# Patient Record
Sex: Female | Born: 1960
Health system: Southern US, Community
[De-identification: ages and names within clinical notes are randomized; demographics above are authoritative.]

## PROBLEM LIST (undated history)

## (undated) DIAGNOSIS — E782 Mixed hyperlipidemia: Secondary | ICD-10-CM

## (undated) DIAGNOSIS — F419 Anxiety disorder, unspecified: Secondary | ICD-10-CM

## (undated) DIAGNOSIS — J302 Other seasonal allergic rhinitis: Secondary | ICD-10-CM

## (undated) DIAGNOSIS — I351 Nonrheumatic aortic (valve) insufficiency: Secondary | ICD-10-CM

## (undated) DIAGNOSIS — Q211 Atrial septal defect: Secondary | ICD-10-CM

## (undated) DIAGNOSIS — G43909 Migraine, unspecified, not intractable, without status migrainosus: Secondary | ICD-10-CM

## (undated) DIAGNOSIS — R7989 Other specified abnormal findings of blood chemistry: Secondary | ICD-10-CM

## (undated) DIAGNOSIS — D649 Anemia, unspecified: Secondary | ICD-10-CM

## (undated) DIAGNOSIS — Q2112 Patent foramen ovale: Secondary | ICD-10-CM

## (undated) DIAGNOSIS — I491 Atrial premature depolarization: Secondary | ICD-10-CM

## (undated) DIAGNOSIS — I712 Thoracic aortic aneurysm, without rupture, unspecified: Secondary | ICD-10-CM

## (undated) DIAGNOSIS — I471 Supraventricular tachycardia, unspecified: Secondary | ICD-10-CM

## (undated) HISTORY — PX: EXPLORATORY LAPAROTOMY: SUR591

## (undated) HISTORY — DX: Migraine, unspecified, not intractable, without status migrainosus: G43.909

## (undated) HISTORY — DX: Anxiety disorder, unspecified: F41.9

## (undated) HISTORY — DX: Other seasonal allergic rhinitis: J30.2

## (undated) HISTORY — DX: Mixed hyperlipidemia: E78.2

## (undated) HISTORY — DX: Thoracic aortic aneurysm, without rupture: I71.2

## (undated) HISTORY — PX: DILATION AND CURETTAGE OF UTERUS: SHX78

## (undated) HISTORY — PX: EYE SURGERY: SHX253

## (undated) HISTORY — DX: Nonrheumatic aortic (valve) insufficiency: I35.1

## (undated) HISTORY — DX: Supraventricular tachycardia: I47.1

## (undated) HISTORY — DX: Supraventricular tachycardia, unspecified: I47.10

## (undated) HISTORY — PX: OTHER SURGICAL HISTORY: SHX169

## (undated) HISTORY — DX: Other specified abnormal findings of blood chemistry: R79.89

## (undated) HISTORY — DX: Thoracic aortic aneurysm, without rupture, unspecified: I71.20

## (undated) HISTORY — DX: Atrial premature depolarization: I49.1

## (undated) SURGERY — ECHOCARDIOGRAM, TRANSESOPHAGEAL
Anesthesia: Moderate Sedation

---

## 1998-05-30 ENCOUNTER — Other Ambulatory Visit: Admission: RE | Admit: 1998-05-30 | Discharge: 1998-05-30 | Payer: Self-pay | Admitting: *Deleted

## 1999-07-10 ENCOUNTER — Other Ambulatory Visit: Admission: RE | Admit: 1999-07-10 | Discharge: 1999-07-10 | Payer: Self-pay | Admitting: *Deleted

## 2001-10-26 ENCOUNTER — Other Ambulatory Visit: Admission: RE | Admit: 2001-10-26 | Discharge: 2001-10-26 | Payer: Self-pay | Admitting: *Deleted

## 2002-12-14 ENCOUNTER — Other Ambulatory Visit: Admission: RE | Admit: 2002-12-14 | Discharge: 2002-12-14 | Payer: Self-pay | Admitting: *Deleted

## 2004-01-01 ENCOUNTER — Other Ambulatory Visit: Admission: RE | Admit: 2004-01-01 | Discharge: 2004-01-01 | Payer: Self-pay | Admitting: *Deleted

## 2004-02-14 ENCOUNTER — Other Ambulatory Visit: Admission: RE | Admit: 2004-02-14 | Discharge: 2004-02-14 | Payer: Self-pay | Admitting: *Deleted

## 2004-09-25 ENCOUNTER — Other Ambulatory Visit: Admission: RE | Admit: 2004-09-25 | Discharge: 2004-09-25 | Payer: Self-pay | Admitting: *Deleted

## 2005-10-22 ENCOUNTER — Other Ambulatory Visit: Admission: RE | Admit: 2005-10-22 | Discharge: 2005-10-22 | Payer: Self-pay | Admitting: *Deleted

## 2006-12-21 ENCOUNTER — Other Ambulatory Visit: Admission: RE | Admit: 2006-12-21 | Discharge: 2006-12-21 | Payer: Self-pay | Admitting: *Deleted

## 2009-09-18 ENCOUNTER — Ambulatory Visit: Payer: Self-pay | Admitting: Internal Medicine

## 2009-09-18 DIAGNOSIS — R002 Palpitations: Secondary | ICD-10-CM | POA: Insufficient documentation

## 2009-09-18 DIAGNOSIS — E559 Vitamin D deficiency, unspecified: Secondary | ICD-10-CM | POA: Insufficient documentation

## 2009-09-18 DIAGNOSIS — M546 Pain in thoracic spine: Secondary | ICD-10-CM | POA: Insufficient documentation

## 2009-09-18 LAB — CONVERTED CEMR LAB: CRP, High Sensitivity: 29.96 — ABNORMAL HIGH (ref 0.00–5.00)

## 2009-09-26 ENCOUNTER — Ambulatory Visit: Payer: Self-pay | Admitting: Internal Medicine

## 2009-09-26 LAB — CONVERTED CEMR LAB: Tissue Transglutaminase Ab, IgA: 12.3 units (ref <20)

## 2009-09-28 ENCOUNTER — Encounter: Payer: Self-pay | Admitting: Internal Medicine

## 2009-10-05 LAB — CONVERTED CEMR LAB
Basophils Relative: 0.5 % (ref 0.0–3.0)
Eosinophils Absolute: 0.1 10*3/uL (ref 0.0–0.7)
Eosinophils Relative: 2.2 % (ref 0.0–5.0)
Folate: 11.9 ng/mL
Hemoglobin: 11.1 g/dL — ABNORMAL LOW (ref 12.0–15.0)
Iron: 65 ug/dL (ref 42–145)
Lymphocytes Relative: 23 % (ref 12.0–46.0)
Lymphs Abs: 1.2 10*3/uL (ref 0.7–4.0)
MCHC: 34.8 g/dL (ref 30.0–36.0)
MCV: 87.2 fL (ref 78.0–100.0)
Neutro Abs: 3.4 10*3/uL (ref 1.4–7.7)
Neutrophils Relative %: 66.1 % (ref 43.0–77.0)
RBC: 3.67 M/uL — ABNORMAL LOW (ref 3.87–5.11)
Sed Rate: 63 mm/hr — ABNORMAL HIGH (ref 0–22)
Transferrin: 215.8 mg/dL (ref 212.0–360.0)
Vitamin B-12: 566 pg/mL (ref 211–911)

## 2009-10-08 LAB — CONVERTED CEMR LAB
AST: 31 units/L (ref 0–37)
BUN: 13 mg/dL (ref 6–23)
Basophils Relative: 1.4 % (ref 0.0–3.0)
Bilirubin, Direct: 0 mg/dL (ref 0.0–0.3)
Eosinophils Absolute: 0.1 10*3/uL (ref 0.0–0.7)
Eosinophils Relative: 1.7 % (ref 0.0–5.0)
HDL: 76.5 mg/dL (ref >39.00)
Hemoglobin: 11.5 g/dL — ABNORMAL LOW (ref 12.0–15.0)
LDL Cholesterol: 79 mg/dL (ref 0–99)
Lymphocytes Relative: 24.4 % (ref 12.0–46.0)
Lymphs Abs: 1.3 10*3/uL (ref 0.7–4.0)
MCHC: 34.8 g/dL (ref 30.0–36.0)
Monocytes Absolute: 0.5 10*3/uL (ref 0.1–1.0)
Monocytes Relative: 8.5 % (ref 3.0–12.0)
Platelets: 305 10*3/uL (ref 150.0–400.0)
Potassium: 4.1 meq/L (ref 3.5–5.1)
RBC: 3.82 M/uL — ABNORMAL LOW (ref 3.87–5.11)
RDW: 13.3 % (ref 11.5–14.6)
Sodium: 143 meq/L (ref 135–145)
Total Bilirubin: 0.1 mg/dL — ABNORMAL LOW (ref 0.3–1.2)
Total Protein: 7.8 g/dL (ref 6.0–8.3)
Triglycerides: 109 mg/dL (ref 0.0–149.0)
WBC: 5.4 10*3/uL (ref 4.5–10.5)

## 2009-10-18 ENCOUNTER — Ambulatory Visit: Payer: Self-pay | Admitting: Internal Medicine

## 2009-10-19 ENCOUNTER — Ambulatory Visit: Payer: Self-pay | Admitting: Internal Medicine

## 2009-10-19 DIAGNOSIS — R700 Elevated erythrocyte sedimentation rate: Secondary | ICD-10-CM | POA: Insufficient documentation

## 2009-10-19 DIAGNOSIS — D649 Anemia, unspecified: Secondary | ICD-10-CM | POA: Insufficient documentation

## 2009-11-08 ENCOUNTER — Ambulatory Visit: Payer: Self-pay | Admitting: Cardiovascular Disease

## 2009-11-09 ENCOUNTER — Encounter: Payer: Self-pay | Admitting: Internal Medicine

## 2009-11-26 ENCOUNTER — Encounter: Payer: Self-pay | Admitting: Cardiovascular Disease

## 2009-11-26 ENCOUNTER — Ambulatory Visit: Payer: Self-pay | Admitting: Internal Medicine

## 2009-11-26 ENCOUNTER — Ambulatory Visit: Payer: Self-pay

## 2009-11-26 ENCOUNTER — Ambulatory Visit (HOSPITAL_COMMUNITY): Admission: RE | Admit: 2009-11-26 | Discharge: 2009-11-26 | Payer: Self-pay | Admitting: Cardiovascular Disease

## 2009-12-05 ENCOUNTER — Telehealth: Payer: Self-pay | Admitting: Cardiovascular Disease

## 2010-04-02 NOTE — Assessment & Plan Note (Signed)
Summary: new to est--ok per dr panosh//ccm   Vital Signs:  Patient profile:   50 year old female Menstrual status:  postmenopausal Height:      63 inches Weight:      146 pounds BMI:     25.96 Pulse rate:   78 / minute BP sitting:   110 / 60  (left arm) Cuff size:   regular  Vitals Entered By: Romualdo Bolk, CMA (AAMA) (September 18, 2009 3:27 PM) CC: New Pt to establish- Pt is having pains in middle back between shoulder blades, anxiety and heart fluttering.     Menstrual Status postmenopausal Last PAP Result normal   History of Present Illness: Marissa Bailey comes in today  for new patient visit   to establish . She has generally been well but has the above concerns .No recent  PCP   Just [facility].   Dr Randell Patient. and then Dr  Elana Alm.  [facility] . last blood test     this past year showed  lower vitamin D     and place on otc supp .  Marland Kitchen Concerns  are with heart  Fluttering  like thumping at times  and not related to exercise/ no racing and  ? anxiety    feeling.   lasts  less than 30   minutes . Also gets  Upper thorax pain. sharp  noted mostly  when sitting still.    Doesn appear to be with exercise . ? if fomr anxiety or could be heart related. She has a family hx of HD.  Meds:  Taking vitamins     from menopausal ... lmp 2006    ocass hot flushes  not tha many.   Sleep interrupted at times .. SOmetimes is anxious and a little down but doesnt know why. ? menopausal changes.      Preventive Care Screening  Mammogram:    Date:  09/17/2009    Results:  normal   Pap Smear:    Date:  03/03/2009    Results:  normal   Last Tetanus Booster:    Date:  03/04/2003    Results:  Historical    Preventive Screening-Counseling & Management  Alcohol-Tobacco     Alcohol drinks/day: <1     Alcohol type: all     Smoking Status: never  Caffeine-Diet-Exercise     Caffeine use/day: 3     Does Patient Exercise: yes     Type of exercise: treadmill/zumba .     Exercise  (avg: min/session): 45 minutes.      Times/week: 3  Safety-Violence-Falls     Seat Belt Use: yes     Smoke Detectors: yes      Drug Use:  no.    Current Medications (verified): 1)  Multivitamins   Tabs (Multiple Vitamin) 2)  Vitamin E 600 Unit  Caps (Vitamin E) 3)  Calcium Carbonate-Vitamin D 600-400 Mg-Unit  Tabs (Calcium Carbonate-Vitamin D) 4)  Vitamin D 1000 Unit  Tabs (Cholecalciferol) 5)  Flax   Oil (Flaxseed (Linseed)) 6)  Fish Oil   Oil (Fish Oil) 7)  Co Q-10 30 Mg  Caps (Coenzyme Q10) 8)  Vitamin C 500 Mg  Tabs (Ascorbic Acid) 9)  Echinacea 400 Mg Caps (Echinacea) 10)  Evening Primrose Oil 500 Mg Caps (Evening Primrose Oil)  Allergies (verified): No Known Drug Allergies  Past History:  Past Medical History: Menstrual Migraines Allergies  G2P1  Past Surgical History: Laparotomy-exploratory  Past History:  Care  Management: OB/Gyn: [facility]-in the past  [facility]: Dorcas Mcmur[facility]  Family History: Father: Healthy  Mother: Heart Attack-Brain impairment   age 71  smokes  then diabetes   on insulin   elevated TG  Siblings: Sister- Hole in heart as a child   surgery age 79  Bro ? HT   Pgm  CVA   in 76s     Breast cancer  MGM  MI age 2   Maternal aunt :   ? 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 College graduate  Smoking Status:  never Caffeine use/day:  3 Does Patient Exercise:  yes Drug Use:  no Seat Belt Use:  yes  Review of Systems  The patient denies anorexia, fever, weight loss, weight gain, vision loss, decreased hearing, hoarseness, syncope, dyspnea on exertion, peripheral edema, prolonged cough, abdominal pain, melena, hematochezia, severe indigestion/heartburn, muscle weakness, transient blindness, difficulty walking, abnormal bleeding, enlarged lymph nodes, and angioedema.         no  weakness or acute joint swelling ... does have some joint pains at times   Physical Exam  General:  alert, well-developed, and well-nourished.   in no acute distress  Head:  normocephalic, atraumatic, and no abnormalities observed.   Eyes:  vision grossly intact, pupils equal, and pupils round.   Ears:  R ear normal, L ear normal, and no external deformities.   Nose:  no external deformity and no nasal discharge.   Mouth:  good dentition and pharynx pink and moist.   Neck:  No deformities, masses, or tenderness noted. Lungs:  Normal respiratory effort, chest expands symmetrically. Lungs are clear to auscultation, no crackles or wheezes.no dullness.   Heart:  Normal rate and regular rhythm. S1 and S2 normal without gallop, murmur, click, rub or other extra sounds.no lifts.   Abdomen:  Bowel sounds positive,abdomen soft and non-tender without masses, organomegaly or   noted. Msk:  normal ROM, no joint tenderness, no joint swelling, and no joint warmth.   Pulses:  pulses intact without delay   Extremities:  no clubbing cyanosis or edema  Neurologic:  alert & oriented X3, strength normal in all extremities, gait normal, and DTRs symmetrical and normal.   Skin:  turgor normal, color normal, no ecchymoses, and no petechiae.   Cervical Nodes:  No lymphadenopathy noted Psych:  Oriented X3, normally interactive, good eye contact, not anxious appearing, and not depressed appearing.   EKG  no acute changes  sinus rhythm .   Impression & Recommendations:  Problem # 1:  PALPITATIONS (ICD-785.1) seem benign and poss from anxiety menopause...but has a family hs of   early Cv disease in females x 2  Orders: TLB-BMP (Basic Metabolic Panel-BMET) (80048-METABOL) TLB-CBC Platelet - w/Differential (85025-CBCD) TLB-Hepatic/Liver Function Pnl (80076-HEPATIC) TLB-TSH (Thyroid Stimulating Hormone) (84443-TSH) TLB-T3, Free (Triiodothyronine) (84481-T3FREE) TLB-T4 (Thyrox), Free [id number]) EKG w/  Interpretation (93000) Cardiology Referral (Cardiology)  Problem # 2:  BACK PAIN, THORACIC REGION (ICD-724.1) episodic   non radiating  Orders: TLB-BMP (Basic Metabolic Panel-BMET) (80048-METABOL) TLB-CBC Platelet - w/Differential (85025-CBCD) TLB-Hepatic/Liver Function Pnl (80076-HEPATIC) TLB-TSH (Thyroid Stimulating Hormone) (84443-TSH) EKG w/ Interpretation (93000) Cardiology Referral (Cardiology)  Problem # 3:  MYOCARDIAL INFARCTION, PREMATURE, FAMILY HX (ICD-V17.3)  mom and MGM  below age 46   Orders: EKG w/ Interpretation (93000) Cardiology Referral (Cardiology)  Problem # 4:  VITAMIN D  DEFICIENCY (ICD-268.9)  per gyne and on otc meds for months     will recheck level today   Orders: TLB-CRP-High Sensitivity (C-Reactive Protein) (86140-FCRP) T-Vitamin D (25-Hydroxy) (16109-60454) Specimen Handling (09811) EKG w/ Interpretation (93000)  Problem # 5:  ? of OTHER ANXIETY STATES (ICD-300.09)  ok to try benzo short term Discussed risk benefit    .     and then follow up after cards eval.  Her updated medication list for this problem includes:    Lorazepam 0.5 Mg Tabs (Lorazepam) .Marland Kitchen... 1 by mouth two times a day if needed for anxiety  Complete Medication List: 1)  Multivitamins Tabs (Multiple vitamin) 2)  Vitamin E 600 Unit Caps (Vitamin e) 3)  Calcium Carbonate-vitamin D 600-400 Mg-unit Tabs (Calcium carbonate-vitamin d) 4)  Vitamin D 1000 Unit Tabs (Cholecalciferol) 5)  Flax Oil (Flaxseed (linseed)) 6)  Fish Oil Oil (Fish oil) 7)  Co Q-10 30 Mg Caps (Coenzyme q10) 8)  Vitamin C 500 Mg Tabs (Ascorbic acid) 9)  Echinacea 400 Mg Caps (Echinacea) 10)  Evening Primrose Oil 500 Mg Caps (Evening primrose oil) 11)  Lorazepam 0.5 Mg Tabs (Lorazepam) .Marland Kitchen.. 1 by mouth two times a day if needed for anxiety  Other Orders: TLB-Lipid Panel (80061-LIPID)  Patient Instructions: 1)  You will be informed of lab results when available. then decide follow up . Consider  Cardiology  risk assessment because of your family hx . 2)  I think your signs seem like anxiety and muscle skeletal pain. 3)  Menopause could contribute to the sleep isses. 4)  Limit caffiene . for now . 5)  Stop the vitamin E . 6)  can  try  asneeded med for anxiety to see if this helps.  7)  Rov after  cardiology evaluation or as needed. 8)  Call when wishes colonsocopy to be ordered. Prescriptions: LORAZEPAM 0.5 MG TABS (LORAZEPAM) 1 by mouth two times a day if needed for anxiety  #24 x 0   Entered and Authorized by:   Madelin Headings MD   Signed by:   Madelin Headings MD on 09/18/2009   Method used:   Print then Give to Patient   RxID:   [id number]

## 2010-04-02 NOTE — Letter (Signed)
Summary: PATIENT HISTORY  PATIENT HISTORY   Imported By: Everrett Coombe 09/28/2009 09:00:32  _____________________________________________________________________  External Attachment:    Type:   Image     Comment:   External Document

## 2010-04-02 NOTE — Miscellaneous (Signed)
Summary: Appointment Canceled  Appointment status changed to canceled by [employer] on 11/09/2009 9:26 AM.  Cancellation Comments --------------------- echo/785.1/wpa  Appointment Information ----------------------- Appt Type:  CARDIOLOGY ANCILLARY VISIT      Date:  Monday, November 26, 2009      Time:  7:30 AM for 60 min   Urgency:  Routine   Made By:  Pearson Grippe  To Visit:  [id number]    Reason:  echo/785.1/wpa  Appt Comments ------------- -- 11/09/09 9:26: ([facility]) CANCELED -- echo/785.1/wpa -- 11/08/09 15:54: ([facility]) BOOKED -- Routine CARDIOLOGY ANCILLARY VISIT at 11/26/2009 7:30 AM for 60 min echo/785.1/wpa

## 2010-04-02 NOTE — Progress Notes (Signed)
Summary: request call regarding echo results  Phone Note Call from Patient Call back at Home Phone [phone] Call back at [phone]   Caller: Patient Summary of Call: Pt request call regarding echo results Initial call taken by: Judie Grieve,  December 05, 2009 4:13 PM  Follow-up for Phone Call        Will follow up with McAlhany in 1 year.  Follow-up by: Whitney Maeola Sarah RN,  December 07, 2009 8:54 AM

## 2010-04-02 NOTE — Assessment & Plan Note (Signed)
Summary: np6/palps/jml   Visit Type:  new pt visit Referring Provider:  [name] Primary Provider:  Madelin Headings MD  CC:  pt states she was referred over to Cards for upper back pain...also has Fx h/o CAD states mother had MI @ 13...no cardiac complaints today.  History of Present Illness: 50 yo WF no significant past medical history but with a strong family history of CAD who is referred today for cardiac evaluation. She tells me that she has been feeling well but occasionally she feels a discomfort in her back. This is between the shoulder blades. This occurs at rest but not with exercise. She exercise on her treadmill every day and has no chest pain or SOB. Occasional episodes of heart fluttering when she is anxious.  These occur once per month. No near syncope or syncope. These episodes last for a few seconds.   Current Medications (verified): 1)  Multivitamins   Tabs (Multiple Vitamin) .Marland Kitchen.. 1 Tab Once Daily 2)  Calcium Carbonate-Vitamin D 600-400 Mg-Unit  Tabs (Calcium Carbonate-Vitamin D) .Marland Kitchen.. 1 Tab Once Daily 3)  Vitamin D 1000 Unit  Tabs (Cholecalciferol) .Marland Kitchen.. 1 Tab Once Daily 4)  Flax   Oil (Flaxseed (Linseed)) .... As Directed 5)  Fish Oil 1000 Mg Caps (Omega-3 Fatty Acids) .Marland Kitchen.. 1 Cap Once Daily 6)  Co Q-10 30 Mg  Caps (Coenzyme Q10) .Marland Kitchen.. 1 Tab Once Daily 7)  Vitamin C 500 Mg  Tabs (Ascorbic Acid) .Marland Kitchen.. 1 Tab Once Daily 8)  Echinacea 400 Mg Caps (Echinacea) .Marland Kitchen.. 1 Cap Once Daily 9)  Evening Primrose Oil 500 Mg Caps (Evening Primrose Oil) .Marland Kitchen.. 1 Cap Once Daily 10)  Lorazepam 0.5 Mg Tabs (Lorazepam) .Marland Kitchen.. 1 By Mouth Two Times A Day If Needed For Anxiety  Allergies (verified): No Known Drug Allergies  Past History:  Past Medical History: Menstrual Migraines Seasonal Allergies  G2P1 Anxiety  Past Surgical History: Laparotomy-exploratory for tubal pregnancy Vein surgery lower extremity  Family History: Reviewed history from 09/18/2009 and no changes required. Father:  Alive, Healthy  Mother: Alive, Heart Attack-Brain impairment   age 77  smokes  then diabetes   on insulin   elevated TG  Siblings: Sister- Hole in heart as a child   surgery age 53  2 Brtheres both alive and healthy  Pgm  CVA   in 7s     Breast cancer  Great MGM  MI age 58   Maternal aunt :   ? hepatitis    Social History: Reviewed history from 09/18/2009 and no changes required. 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  The patient denies fatigue, malaise, fever, weight gain/loss, vision loss, decreased hearing, hoarseness, chest pain, shortness of breath, prolonged cough, wheezing, sleep apnea, coughing up blood, abdominal pain, blood in stool, nausea, vomiting, diarrhea, heartburn, incontinence, blood in urine, muscle weakness, joint pain, leg swelling, rash, skin lesions, headache, fainting, dizziness, depression, anxiety, enlarged lymph nodes, easy bruising or bleeding, and environmental allergies.    Vital Signs:  Patient profile:   50 year old female Menstrual status:  postmenopausal Height:      63 inches Weight:      148 pounds BMI:     26.31 Pulse rate:   69 / minute Pulse rhythm:   regular  BP sitting:   118 / 62  (left arm) Cuff size:   large  Vitals Entered By: Danielle Rankin, CMA (November 08, 2009 3:18 PM)  Physical Exam  General:  General: Well developed, well nourished, NAD HEENT: OP clear, mucus membranes moist SKIN: warm, dry Neuro: No focal deficits Musculoskeletal: Muscle strength 5/5 all ext Psychiatric: Mood and affect normal Neck: No JVD, no carotid bruits, no thyromegaly, no lymphadenopathy. Lungs:Clear bilaterally, no wheezes, rhonci, crackles CV: RRR no murmurs, gallops rubs Abdomen: soft, NT, ND, BS present Extremities: No edema, pulses  2+.    EKG  Procedure date:  11/08/2009  Findings:      NSR, rate 69 bpm. Normal EKG.   Impression & Recommendations:  Problem # 1:  PALPITATIONS (ICD-785.1) These are rare and only last for a few seconds. Likely benign. Will check echo.  Orders: EKG w/ Interpretation (93000) Echocardiogram (Echo)  Problem # 2:  MYOCARDIAL INFARCTION, PREMATURE, FAMILY HX (ICD-V17.3) No personal cardiac risk factors. She does have a strong family history of CAD. Will arrange echo to assess LV function and exclude any structural abnormalities.   Patient Instructions: 1)  Your physician recommends that you schedule a follow-up appointment in: 3 weeks. 2)  Your physician recommends that you continue on your current medications as directed. Please refer to the Current Medication list given to you today. 3)  Your physician has requested that you have an echocardiogram.  Echocardiography is a painless test that uses sound waves to create images of your heart. It provides your doctor with information about the size and shape of your heart and how well your heart's chambers and valves are working.  This procedure takes approximately one hour. There are no restrictions for this procedure.

## 2010-08-26 ENCOUNTER — Ambulatory Visit (INDEPENDENT_AMBULATORY_CARE_PROVIDER_SITE_OTHER): Payer: BC Managed Care – PPO | Admitting: Internal Medicine

## 2010-08-26 ENCOUNTER — Encounter: Payer: Self-pay | Admitting: Internal Medicine

## 2010-08-26 DIAGNOSIS — H9190 Unspecified hearing loss, unspecified ear: Secondary | ICD-10-CM

## 2010-08-26 DIAGNOSIS — H6120 Impacted cerumen, unspecified ear: Secondary | ICD-10-CM

## 2010-08-26 DIAGNOSIS — H6980 Other specified disorders of Eustachian tube, unspecified ear: Secondary | ICD-10-CM

## 2010-08-31 ENCOUNTER — Encounter: Payer: Self-pay | Admitting: Internal Medicine

## 2010-08-31 DIAGNOSIS — H6990 Unspecified Eustachian tube disorder, unspecified ear: Secondary | ICD-10-CM | POA: Insufficient documentation

## 2010-08-31 DIAGNOSIS — H6120 Impacted cerumen, unspecified ear: Secondary | ICD-10-CM | POA: Insufficient documentation

## 2010-08-31 DIAGNOSIS — H9190 Unspecified hearing loss, unspecified ear: Secondary | ICD-10-CM | POA: Insufficient documentation

## 2010-08-31 DIAGNOSIS — H6980 Other specified disorders of Eustachian tube, unspecified ear: Secondary | ICD-10-CM | POA: Insufficient documentation

## 2010-08-31 DIAGNOSIS — Z8249 Family history of ischemic heart disease and other diseases of the circulatory system: Secondary | ICD-10-CM | POA: Insufficient documentation

## 2010-08-31 NOTE — Progress Notes (Signed)
  Subjective:    Patient ID: Marissa Bailey, female    DOB: 06-Nov-1960, 50 y.o.   MRN: [id number]  HPI Patient comes in today as an acute work in for onset of decrease hearing left ear for 1 day after scuba diving in [place] . No pain although took a while to accomodate ears.   No uri sx  Some popping no tinnitus or balance or vertigo sx.  ? If water in the ear   Cant hear out of ear today.   Review of Systems Neg cp sob fever pain in ear   Past history family history social history reviewed in the electronic medical record.     Objective:   Physical Exam wdwn in nad HEENT: Normocephalic ;atraumatic , Eyes;  PERRL, EOMs  Full, lids and conjunctiva clear,,Ears: no deformities, canals with brown wax bilaterally   Flushed  Out and tm intact , TM landmarks ?normal slightly dec light reflex but translucent , Nose: no deformity or discharge  Mouth : OP clear without lesion or edema . Neck supple and no masses.   Assessment & Plan:  Dec hearing ? From wax in ear   Plus   Poss eustachian tube Dysfunction.  Hearing much better after wax removal .    Expectant management.  Call if pain or discharge or further problems.

## 2011-06-19 ENCOUNTER — Telehealth: Payer: Self-pay | Admitting: Oncology

## 2011-06-19 NOTE — Telephone Encounter (Signed)
Called pt, left message regarding appt, waiting for a call back

## 2011-06-23 ENCOUNTER — Telehealth: Payer: Self-pay | Admitting: Oncology

## 2011-06-23 NOTE — Telephone Encounter (Signed)
Talked to pt, gave her appt date ,location and telephone number

## 2011-06-23 NOTE — Telephone Encounter (Signed)
Called pt again left message waiting for call back

## 2011-06-24 ENCOUNTER — Telehealth: Payer: Self-pay | Admitting: Oncology

## 2011-06-24 ENCOUNTER — Other Ambulatory Visit: Payer: Self-pay | Admitting: Oncology

## 2011-06-24 DIAGNOSIS — D649 Anemia, unspecified: Secondary | ICD-10-CM

## 2011-06-24 DIAGNOSIS — R700 Elevated erythrocyte sedimentation rate: Secondary | ICD-10-CM

## 2011-06-24 NOTE — Telephone Encounter (Signed)
Referred by Dr. Reita Chard Dx- Evaluate for Clotting Risk

## 2011-06-25 ENCOUNTER — Ambulatory Visit (HOSPITAL_BASED_OUTPATIENT_CLINIC_OR_DEPARTMENT_OTHER): Payer: BC Managed Care – PPO | Admitting: Oncology

## 2011-06-25 ENCOUNTER — Ambulatory Visit: Payer: BC Managed Care – PPO

## 2011-06-25 ENCOUNTER — Encounter: Payer: Self-pay | Admitting: Oncology

## 2011-06-25 ENCOUNTER — Other Ambulatory Visit: Payer: Self-pay

## 2011-06-25 ENCOUNTER — Other Ambulatory Visit (HOSPITAL_BASED_OUTPATIENT_CLINIC_OR_DEPARTMENT_OTHER): Payer: BC Managed Care – PPO | Admitting: Lab

## 2011-06-25 VITALS — BP 116/66 | HR 68 | Temp 97.7°F | Ht 63.0 in | Wt 139.5 lb

## 2011-06-25 DIAGNOSIS — R7982 Elevated C-reactive protein (CRP): Secondary | ICD-10-CM

## 2011-06-25 DIAGNOSIS — D649 Anemia, unspecified: Secondary | ICD-10-CM

## 2011-06-25 DIAGNOSIS — R700 Elevated erythrocyte sedimentation rate: Secondary | ICD-10-CM

## 2011-06-25 DIAGNOSIS — R7989 Other specified abnormal findings of blood chemistry: Secondary | ICD-10-CM

## 2011-06-25 DIAGNOSIS — R899 Unspecified abnormal finding in specimens from other organs, systems and tissues: Secondary | ICD-10-CM

## 2011-06-25 LAB — MORPHOLOGY
PLT EST: ADEQUATE
RBC Comments: NORMAL

## 2011-06-25 LAB — COMPREHENSIVE METABOLIC PANEL
ALT: 19 U/L (ref 0–35)
AST: 26 U/L (ref 0–37)
CO2: 27 mEq/L (ref 19–32)
Creatinine, Ser: 1.06 mg/dL (ref 0.50–1.10)
Sodium: 140 mEq/L (ref 135–145)
Total Bilirubin: 0.3 mg/dL (ref 0.3–1.2)
Total Protein: 8.1 g/dL (ref 6.0–8.3)

## 2011-06-25 LAB — CBC WITH DIFFERENTIAL/PLATELET
BASO%: 0.5 % (ref 0.0–2.0)
Eosinophils Absolute: 0.1 10*3/uL (ref 0.0–0.5)
MCHC: 33.9 g/dL (ref 31.5–36.0)
MONO#: 0.5 10*3/uL (ref 0.1–0.9)
NEUT#: 4.1 10*3/uL (ref 1.5–6.5)
Platelets: 244 10*3/uL (ref 145–400)
RBC: 4.06 10*6/uL (ref 3.70–5.45)
RDW: 13.4 % (ref 11.2–14.5)
WBC: 5.7 10*3/uL (ref 3.9–10.3)
lymph#: 1 10*3/uL (ref 0.9–3.3)
nRBC: 0 % (ref 0–0)

## 2011-06-25 LAB — LACTATE DEHYDROGENASE: LDH: 152 U/L (ref 94–250)

## 2011-06-25 LAB — CHCC SMEAR

## 2011-06-25 LAB — IRON AND TIBC
%SAT: 17 % — ABNORMAL LOW (ref 20–55)
TIBC: 283 ug/dL (ref 250–470)
UIBC: 236 ug/dL (ref 125–400)

## 2011-06-25 LAB — FERRITIN: Ferritin: 357 ng/mL — ABNORMAL HIGH (ref 10–291)

## 2011-06-25 LAB — FIBRINOGEN: Fibrinogen: 484 mg/dL — ABNORMAL HIGH (ref 204–475)

## 2011-06-25 NOTE — Progress Notes (Signed)
New Patient Hematology-Oncology Evaluation    Marissa Bailey [id number] 1960-06-24 51 y.o. 06/25/2011  CC: [name]; [name]   Reason for referral: Elevation of multiple acute phase reactant laboratory tests   HPI: New patient evaluation for this pleasant 51 year old woman who has been in overall excellent health without any major medical or surgical illness. Her mother had a major heart attack at age 51 and remains handicapped as a result now in her 26s. Her father also in his 71s recently had a hernia repair, developed a DVT, and was diagnosed with prostate cancer treated with radiation. She has 2 brothers and one sister who are healthy. One of her sisters had a congenital heart defect and had to have what sounds like a ASD or VSD closed Surgically. [name] had an early menopause at age 60 and has had significant menopausal symptoms with hot flashes and loss of libido. She has had some hair loss as well. She was seen at the [facility] by [name] most recently on 05/14/2010. A number of laboratory tests were obtained back in June of 2012 which included a CBC with a hemoglobin of 12.5 hematocrit 35 MCV 86 white count 5200 67 neutrophils 25 lymphocytes 7 monocytes, and platelet count 272,000. Chemistry profile was normal. A ferritin was done as part of the chemistry profile was elevated at 399. Liver functions were normal. TSH normal at 1.12. ESR borderline elevated at 30 mm. Lab tests were repeated recently on 3/13. Ferritin remains elevated at 385 (13-150), a fibrinogen was 445 (193-423), A C reactive protein is elevated at 27.3 (0-3) ANA negative.  She is totally asymptomatic. She has no signs or symptoms of a collagen vascular disorder and specifically denies any acute or chronic myalgias or arthralgias. She has no constitutional symptoms. Her appetite is good. She is in a diet and exercise program right now. She is not getting any night sweats. Her  hot flashes have been relieved with some of the hormonal therapys and other supplements provided by Ms. Worrell. She denies any chronic gingivitis, she has no prosthetic implants, she is a never smoker, she has no history of hepatitis, yellow jaundice, malaria, tuberculosis, asthma. No chronic allergies.  Mammograms have been normal in the past. She just had a mammogram yesterday and has been recalled for additional images of her left breast. Physical exam by her internist and by Ms. Worrell unrevealing for any palpable breast masses. She has never had a colonoscopy. She denies any change in bowel habit. No hematochezia or melena. She has had no vaginal bleeding or spotting. She gives a history of removal of a benign  skin tumor from the back of her right thigh. She has a history of what sounds like mitral valve prolapse with an occasional rare palpitation.   PMH: Past Medical History  Diagnosis Date  . Migraines     menstrual  . Seasonal allergies   . Anxiety     Past Surgical History  Procedure Date  . Exploratory laparotomy     for tubal pregnancy  . 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 She has a history of a tubal pregnancy and a history of a miscarriage at 4 months with her first husband. She is remarried  and has no children with her second husband.  She reports that she has never smoked. She reports that she drinks alcohol. She drinks wine socially.  Family History: Family History  Problem Relation Age of Onset  . Heart attack Mother 15    brain impairment tobacco dm  . Diabetes Mother   . Hyperlipidemia Mother   . Other Sister     whole in heart age 34  . Cancer Paternal Grandmother     breast  . Stroke Paternal Grandmother     Review of Systems: Constitutional  symptoms: See above HEENT: No sore throat Respiratory: No cough or dyspnea Cardiovascular:  No chest pain, pressure, rare palpitation Gastrointestinal ROS: See above Genito-Urinary ROS: See above Hematological and Lymphatic: Musculoskeletal: See above Neurologic: No chronic headaches no change in vision Dermatologic: No rash Remaining ROS negative.  Physical Exam: Blood pressure 116/66, pulse 68, temperature 97.7 F (36.5 C), temperature source Oral, height 5\' 3"  (1.6 m), weight 139 lb 8 oz (63.277 kg). Wt Readings from Last 3 Encounters:  06/25/11 139 lb 8 oz (63.277 kg)  08/26/10 153 lb (69.4 kg)  11/08/09 148 lb (67.132 kg)    General appearance: Healthy appearing Caucasian woman Head: Normal Neck: Normal Lymph nodes: No cervical supraclavicular or axillary adenopathy Breasts: Not done recent exam by internist normal see comments above re mammogram Lungs: Clear to auscultation resonant to percussion Heart: Regular rhythm no murmur Abdominal: Soft nontender no mass no organomegaly GU: Not examined Extremities: No edema no calf tenderness Neurologic: Pupils equal round reactive to light optic disc sharp vessels normal, motor strength 5 over 5, reflexes 2+ symmetric, coordination and gait normal, sensation intact to vibration over the fingertips by tuning fork exam. Skin: No rash or ecchymosis    Lab Results: Lab Results  Component Value Date   WBC 5.7 06/25/2011   HGB 11.8 06/25/2011   HCT 34.9 06/25/2011   MCV 85.9 06/25/2011   PLT 244 06/25/2011     Chemistry      Component Value Date/Time   NA 140 06/25/2011 1515   K 3.7 06/25/2011 1515   CL 103 06/25/2011 1515   CO2 27 06/25/2011 1515   BUN 13 06/25/2011 1515   CREATININE 1.06 06/25/2011 1515      Component Value Date/Time   CALCIUM 9.9 06/25/2011 1515   ALKPHOS 83 06/25/2011 1515   AST 26 06/25/2011 1515   ALT 19 06/25/2011 1515   BILITOT 0.3 06/25/2011 1515       Review of peripheral blood film: Normochromic  normocytic red cells, no rouleaux, mature neutrophils and lymphocytes. Occasional benign reactive lymphocytes. Normal platelets.   Radiological Studies: No results found.    Impression and Plan:  nonspecific elevation of a number of acute phase reactant proteins with no good clinical correlation. She is asymptomatic and has a normal physical exam.  She has a low normal hemoglobin with a normal MCV and an elevated ferritin. It is not clear why she would have a nonspecific inflammatory response going on in her body with no symptoms. A remote possibility would be an occult malignancy. However she has been followed now for almost one year since first laboratory analysis showed signs of nonspecific inflammation and has not developed any new signs or symptoms that would point to a malignancy. She is going to go back and have a followup image done on her left breast. I encouraged her to have a routine colonoscopy. She was started on an aspirin daily and I  think this is a good idea. Other than the recommendations above, I don't see any indication at the current time for any more lab testing or imaging studies. I will be happy to see her again in the future if she develops any new signs or symptoms or if she develops  progressive anemia.      Levert Feinstein, MD 06/25/2011, 6:09 PM

## 2011-06-25 NOTE — Progress Notes (Signed)
New patient today, patient stated she did not know what her financial status was at this time she would rather wait until she talked with Doctor. I gave patient information for Financial [facility]ocate and introduced myself to her and explain to her that is she felt later down the road that she would need some assistance that I would be available to answer any questions and to see if we would be able to assist her.

## 2011-07-02 ENCOUNTER — Encounter: Payer: BC Managed Care – PPO | Admitting: Oncology

## 2011-07-02 ENCOUNTER — Other Ambulatory Visit: Payer: BC Managed Care – PPO

## 2011-07-02 ENCOUNTER — Ambulatory Visit: Payer: BC Managed Care – PPO

## 2011-07-07 ENCOUNTER — Other Ambulatory Visit: Payer: Self-pay | Admitting: Oncology

## 2011-07-07 DIAGNOSIS — R700 Elevated erythrocyte sedimentation rate: Secondary | ICD-10-CM

## 2011-07-07 DIAGNOSIS — D649 Anemia, unspecified: Secondary | ICD-10-CM

## 2011-07-08 ENCOUNTER — Telehealth: Payer: Self-pay | Admitting: Oncology

## 2011-07-08 NOTE — Telephone Encounter (Signed)
Called pt, left message regarding lab for 07/09/11

## 2011-07-09 ENCOUNTER — Other Ambulatory Visit: Payer: BC Managed Care – PPO

## 2011-07-16 ENCOUNTER — Telehealth: Payer: Self-pay | Admitting: Oncology

## 2011-07-16 NOTE — Telephone Encounter (Signed)
Pt retun PRN per MD

## 2011-07-17 ENCOUNTER — Telehealth: Payer: Self-pay | Admitting: Oncology

## 2011-07-17 ENCOUNTER — Other Ambulatory Visit: Payer: BC Managed Care – PPO | Admitting: Lab

## 2011-07-17 ENCOUNTER — Ambulatory Visit (HOSPITAL_BASED_OUTPATIENT_CLINIC_OR_DEPARTMENT_OTHER): Payer: BC Managed Care – PPO | Admitting: Lab

## 2011-07-17 DIAGNOSIS — I10 Essential (primary) hypertension: Secondary | ICD-10-CM

## 2011-07-17 DIAGNOSIS — R700 Elevated erythrocyte sedimentation rate: Secondary | ICD-10-CM

## 2011-07-17 DIAGNOSIS — D649 Anemia, unspecified: Secondary | ICD-10-CM

## 2011-07-17 NOTE — Telephone Encounter (Signed)
Pt aware of appt today °

## 2011-07-18 ENCOUNTER — Ambulatory Visit: Payer: BC Managed Care – PPO | Admitting: Oncology

## 2011-07-21 LAB — KAPPA/LAMBDA LIGHT CHAINS: Kappa:Lambda Ratio: 1.06 (ref 0.26–1.65)

## 2011-07-21 LAB — IMMUNOFIXATION ELECTROPHORESIS
IgG (Immunoglobin G), Serum: 1110 mg/dL (ref 690–1700)
IgM, Serum: 152 mg/dL (ref 52–322)
Total Protein, Serum Electrophoresis: 7.4 g/dL (ref 6.0–8.3)

## 2011-09-22 ENCOUNTER — Encounter: Payer: Self-pay | Admitting: Internal Medicine

## 2011-09-25 ENCOUNTER — Telehealth: Payer: Self-pay | Admitting: Internal Medicine

## 2011-09-25 MED ORDER — NYSTATIN-TRIAMCINOLONE 100000-0.1 UNIT/GM-% EX CREA: TOPICAL_CREAM | Freq: Two times a day (BID) | CUTANEOUS | Status: DC

## 2011-09-25 NOTE — Telephone Encounter (Signed)
Pt called me  On my cella bout getting a topical ointment for ocass labial perineal sore spot . Dr Randell Patient gave her in the past . No hx of hsv dc other issues .  Nystatin tmc cream 15 gram  [facility] [address]  To use bid  Prn  As directed   Sent in please contact pt and tell her sent in.

## 2011-09-26 ENCOUNTER — Other Ambulatory Visit: Payer: Self-pay | Admitting: Family Medicine

## 2011-09-26 MED ORDER — NYSTATIN-TRIAMCINOLONE 100000-0.1 UNIT/GM-% EX CREA: TOPICAL_CREAM | Freq: Two times a day (BID) | CUTANEOUS | Status: DC

## 2011-09-26 NOTE — Telephone Encounter (Signed)
E-scribed to the pharmacy.  Left message on personal cell informing the pt her med was sent.

## 2011-11-04 ENCOUNTER — Telehealth: Payer: Self-pay | Admitting: Internal Medicine

## 2011-11-04 ENCOUNTER — Emergency Department (HOSPITAL_COMMUNITY)
Admission: EM | Admit: 2011-11-04 | Discharge: 2011-11-04 | Disposition: A | Discharge status: Home / Self Care | Payer: BC Managed Care – PPO | Attending: Emergency Medicine | Admitting: Emergency Medicine

## 2011-11-04 ENCOUNTER — Emergency Department (HOSPITAL_COMMUNITY): Payer: BC Managed Care – PPO

## 2011-11-04 ENCOUNTER — Encounter (HOSPITAL_COMMUNITY): Payer: Self-pay

## 2011-11-04 DIAGNOSIS — M549 Dorsalgia, unspecified: Secondary | ICD-10-CM | POA: Insufficient documentation

## 2011-11-04 DIAGNOSIS — R071 Chest pain on breathing: Secondary | ICD-10-CM | POA: Insufficient documentation

## 2011-11-04 DIAGNOSIS — R079 Chest pain, unspecified: Secondary | ICD-10-CM

## 2011-11-04 DIAGNOSIS — Z8249 Family history of ischemic heart disease and other diseases of the circulatory system: Secondary | ICD-10-CM | POA: Insufficient documentation

## 2011-11-04 LAB — COMPREHENSIVE METABOLIC PANEL
ALT: 20 U/L (ref 0–35)
Albumin: 4.1 g/dL (ref 3.5–5.2)
Alkaline Phosphatase: 97 U/L (ref 39–117)
Potassium: 3.8 mEq/L (ref 3.5–5.1)
Sodium: 139 mEq/L (ref 135–145)
Total Protein: 8 g/dL (ref 6.0–8.3)

## 2011-11-04 LAB — CBC WITH DIFFERENTIAL/PLATELET
Basophils Relative: 0 % (ref 0–1)
Eosinophils Absolute: 0.1 10*3/uL (ref 0.0–0.7)
Hemoglobin: 11.8 g/dL — ABNORMAL LOW (ref 12.0–15.0)
MCH: 28.4 pg (ref 26.0–34.0)
MCHC: 33 g/dL (ref 30.0–36.0)
Neutro Abs: 5.2 10*3/uL (ref 1.7–7.7)
Neutrophils Relative %: 75 % (ref 43–77)
Platelets: 288 10*3/uL (ref 150–400)
RBC: 4.15 MIL/uL (ref 3.87–5.11)

## 2011-11-04 MED ORDER — IOHEXOL 350 MG/ML SOLN
100.0000 mL | Freq: Once | INTRAVENOUS | Status: AC | PRN
  Administered 2011-11-04: 100 mL via INTRAVENOUS

## 2011-11-04 MED ORDER — NITROGLYCERIN 0.4 MG SL SUBL
0.4000 mg | SUBLINGUAL_TABLET | Freq: Once | SUBLINGUAL | Status: AC
  Administered 2011-11-04: 0.4 mg via SUBLINGUAL
  Filled 2011-11-04: qty 25

## 2011-11-04 NOTE — ED Notes (Signed)
Pt was given bedpan by RN.

## 2011-11-04 NOTE — Telephone Encounter (Signed)
Per Dr. Fabian Sharp called pt to advise that Dr. Fabian Sharp agrees with CAN. Pt states her boss is goning to take her to the ED now and husband will meet her there.

## 2011-11-04 NOTE — ED Notes (Signed)
Patient transported to CT 

## 2011-11-04 NOTE — ED Provider Notes (Signed)
History     CSN: [id number]  Arrival date & time 11/04/11  1158   First MD Initiated Contact with Patient 11/04/11 1221      Chief Complaint  Patient presents with  . Chest Pain    (Consider location/radiation/quality/duration/timing/severity/associated sxs/prior treatment) Patient is a 51 y.o. female presenting with chest pain. The history is provided by the patient.  Chest Pain Pertinent negatives for primary symptoms include no shortness of breath, no abdominal pain, no nausea and no vomiting.  Pertinent negatives for associated symptoms include no numbness and no weakness.    patient presents with chest pain/tightness that radiates to her back. It began as morning is continued. It comes and onset is currently persistent. She describes it as pressure is worse with inspiration. It is worse with palpation. No trauma. She was able workout yesterday without any chest pain. She does not have previous coronary artery disease. Her mother did have her first heart attack at 53 years old.  Past Medical History  Diagnosis Date  . Migraines     menstrual  . Seasonal allergies   . Anxiety   . Heart palpitations     Past Surgical History  Procedure Date  . Exploratory laparotomy     for tubal pregnancy  . Vein surgery lower extremity     Family History  Problem Relation Age of Onset  . Heart attack Mother 36    brain impairment tobacco dm  . Diabetes Mother   . Hyperlipidemia Mother   . Other Sister     whole in heart age 28  . Cancer Paternal Grandmother     breast  . Stroke Paternal Grandmother     History  Substance Use Topics  . Smoking status: Never Smoker   . Smokeless tobacco: Not on file  . Alcohol Use: Yes     socially    OB History    Grav Para Term Preterm Abortions TAB SAB Ect Mult Living   2 1              Review of Systems  Constitutional: Negative for activity change and appetite change.  HENT: Negative for neck stiffness.   Eyes: Negative for  pain.  Respiratory: Negative for chest tightness and shortness of breath.   Cardiovascular: Positive for chest pain. Negative for leg swelling.  Gastrointestinal: Negative for nausea, vomiting, abdominal pain and diarrhea.  Genitourinary: Negative for flank pain.  Musculoskeletal: Negative for back pain.  Skin: Negative for rash.  Neurological: Negative for weakness, numbness and headaches.  Psychiatric/Behavioral: Negative for behavioral problems.    Allergies  Review of patient's allergies indicates no known allergies.  Home Medications   Current Outpatient Rx  Name Route Sig Dispense Refill  . ASPIRIN 81 MG PO TABS Oral Take 81 mg by mouth at bedtime.     Marland Kitchen VITAMIN D 1000 UNITS PO CAPS Oral Take 2,000 Units by mouth daily.     Marland Kitchen ECHINACEA 400 MG PO CAPS Oral Take by mouth.      . OMEGA-3 FATTY ACIDS 1000 MG PO CAPS Oral Take 2 g by mouth daily.      Marland Kitchen LIOTHYRONINE SODIUM 5 MCG PO TABS Oral Take 15 mcg by mouth daily. Gets capsules compounded from [facility].    . ADULT MULTIVITAMIN W/MINERALS CH Oral Take 1 tablet by mouth daily.    . NON FORMULARY Topical Apply 0.5 mLs topically daily. 70/30 mix of estriol and estradiol in a cream base. Compounded  by [facility].    Marland Kitchen PRESCRIPTION MEDICATION Topical Apply 0.1 mLs topically daily. Testosterone 2% cream compounded by [facility].    Marland Kitchen PROGESTERONE MICRONIZED 100 MG PO CAPS Oral Take 100 mg by mouth at bedtime.    . THYROID 60 MG PO TABS Oral Take 60 mg by mouth daily.    Marland Kitchen VITAMIN B-12 1000 MCG PO TABS Oral Take 1,000 mcg by mouth daily.    Marland Kitchen VITAMIN C 500 MG PO TABS Oral Take 1,000 mg by mouth daily.       BP 134/69  Pulse 92  Temp 99.5 F (37.5 C) (Oral)  Resp 16  SpO2 99%  Physical Exam  Nursing note and vitals reviewed. Constitutional: She is oriented to person, place, and time. She appears well-developed and well-nourished.  HENT:  Head: Normocephalic and atraumatic.  Cardiovascular:  Normal rate, regular rhythm and normal heart sounds.   No murmur heard. Pulmonary/Chest: Effort normal and breath sounds normal. No respiratory distress. She has no wheezes. She has no rales. She exhibits tenderness.       Mild tenderness over the lower medial sternum.   Abdominal: Soft. Bowel sounds are normal. She exhibits no distension. There is no tenderness. There is no rebound and no guarding.  Musculoskeletal: Normal range of motion. She exhibits no edema.  Neurological: She is alert and oriented to person, place, and time. No cranial nerve deficit.  Skin: Skin is warm and dry.  Psychiatric: She has a normal mood and affect. Her speech is normal.    ED Course  Procedures (including critical care time)  Labs Reviewed  CBC WITH DIFFERENTIAL - Abnormal; Notable for the following:    Hemoglobin 11.8 (*)     HCT 35.8 (*)     All other components within normal limits  D-DIMER, QUANTITATIVE - Abnormal; Notable for the following:    D-Dimer, Quant 0.68 (*)     All other components within normal limits  COMPREHENSIVE METABOLIC PANEL  TROPONIN I  TROPONIN I  LAB REPORT - SCANNED   No results found.   1. Chest pain      Date: 11/04/2011  Rate: 79  Rhythm: normal sinus rhythm and sinus arrhythmia  QRS Axis: normal  Intervals: normal  ST/T Wave abnormalities: normal  Conduction Disutrbances:none  Narrative Interpretation: LVH  Old EKG Reviewed: none available    MDM  Chest pain. Radiates to back. Pleuritic. Negative d-dimer. EKG is reassuring. CT angiogram chest is negative. Discharged home follow up with cardiology.        Juliet Rude. Rubin Payor, MD 11/07/11 [phone]

## 2011-11-04 NOTE — Telephone Encounter (Signed)
LMP menopausal Constant dull mid chest pain radiating into her neck, made worse with taking in a deep breath onset 0800 this AM. Advised 911 now per Chest Pain Protocol. Pt declines. Will call husband to take her to [facility] ED now.

## 2011-11-04 NOTE — ED Notes (Signed)
Pt present with chest pain radiated to back. Denies N/V at present- Pt reports pain is not constant yet at present consistent.  Pain greater with inspiration

## 2011-11-04 NOTE — ED Notes (Signed)
Returned from CT.

## 2011-11-04 NOTE — ED Notes (Signed)
Transported to XR  

## 2011-11-04 NOTE — ED Notes (Signed)
Mother has MI at 75

## 2011-11-07 ENCOUNTER — Ambulatory Visit (INDEPENDENT_AMBULATORY_CARE_PROVIDER_SITE_OTHER): Payer: BC Managed Care – PPO | Admitting: Cardiovascular Disease

## 2011-11-07 ENCOUNTER — Encounter: Payer: Self-pay | Admitting: Cardiovascular Disease

## 2011-11-07 VITALS — BP 118/61 | HR 75 | Ht 63.0 in | Wt 136.8 lb

## 2011-11-07 DIAGNOSIS — R002 Palpitations: Secondary | ICD-10-CM

## 2011-11-07 DIAGNOSIS — R079 Chest pain, unspecified: Secondary | ICD-10-CM

## 2011-11-07 DIAGNOSIS — I340 Nonrheumatic mitral (valve) insufficiency: Secondary | ICD-10-CM

## 2011-11-07 DIAGNOSIS — I059 Rheumatic mitral valve disease, unspecified: Secondary | ICD-10-CM

## 2011-11-07 NOTE — Progress Notes (Signed)
History of Present Illness: [name] with history mild MR, PACs,  anxiety and chest pain who is here today for cardiac follow-up. I saw her in September 2011 for evaluation of palpitations. Echo at that time showed normal LV size and function with mild MR. She was seen in the ED on 11/04/11 for CP with radiation to back. CTA negative for PE or aortic dissection. D-dimer mildly elevated.  EKG without ischemia. She thinks she pulled muscles while lifting her grandchild in the pool. This chest pain has resolved. No complaints today. No SOB. Rare palpitations.   Primary Care Physician: [name]  Past Medical History  Diagnosis Date  . Migraines     menstrual  . Seasonal allergies   . Anxiety   . Heart palpitations     Past Surgical History  Procedure Date  . Exploratory laparotomy     for tubal pregnancy  . Vein surgery lower extremity     Current Outpatient Prescriptions  Medication Sig Dispense Refill  . aspirin 81 MG tablet Take 81 mg by mouth at bedtime.       . Cholecalciferol (VITAMIN D) 1000 UNITS capsule Take 2,000 Units by mouth daily.       . Echinacea 400 MG CAPS Take by mouth.        . fish oil-omega-3 fatty acids 1000 MG capsule Take 2 g by mouth daily.        Marland Kitchen liothyronine (CYTOMEL) 5 MCG tablet Take 15 mcg by mouth daily. Gets capsules compounded from [facility].      . Multiple Vitamin (MULTIVITAMIN WITH MINERALS) TABS Take 1 tablet by mouth daily.      . NON FORMULARY Apply 0.5 mLs topically daily. 70/30 mix of estriol and estradiol in a cream base. Compounded by [facility].      Marland Kitchen PRESCRIPTION MEDICATION Apply 0.1 mLs topically daily. Testosterone 2% cream compounded by [facility].      . progesterone (PROMETRIUM) 100 MG capsule Take 100 mg by mouth at bedtime.      Marland Kitchen thyroid (ARMOUR) 60 MG tablet Take 60 mg by mouth daily.      . vitamin B-12 (CYANOCOBALAMIN) 1000 MCG tablet Take 1,000 mcg by mouth daily.      . vitamin C  (ASCORBIC ACID) 500 MG tablet Take 1,000 mg by mouth daily.         No Known Allergies  History   Social History  . Marital Status: Married    Spouse Name: N/A    Number of Children: N/A  . Years of Education: N/A   Occupational History  . Not on file.   Social History Main Topics  . Smoking status: Never Smoker   . Smokeless tobacco: Not on file  . Alcohol Use: Yes     socially  . Drug Use: No  . Sexually Active:    Other Topics Concern  . Not on file   Social History Narrative   Regular exercise- yesHH of 2Pet dogsOccup: Bank teller/ stylistMarried second marriageCollege graduate no childrenG2 p1    Family History  Problem Relation Age of Onset  . Heart attack Mother 31    brain impairment tobacco dm  . Diabetes Mother   . Hyperlipidemia Mother   . Other Sister     whole in heart age 22  . Cancer Paternal Grandmother     breast  . Stroke Paternal Grandmother     Review of Systems:  As stated in the HPI and otherwise negative.   BP 118/61  Pulse 75  Ht 5\' 3"  (1.6 m)  Wt 136 lb 12.8 oz (62.052 kg)  BMI 24.23 kg/m2  Physical Examination: General: Well developed, well nourished, NAD HEENT: OP clear, mucus membranes moist SKIN: warm, dry. No rashes. Neuro: No focal deficits Musculoskeletal: Muscle strength 5/5 all ext Psychiatric: Mood and affect normal Neck: No JVD, no carotid bruits, no thyromegaly, no lymphadenopathy. Lungs:Clear bilaterally, no wheezes, rhonci, crackles Cardiovascular: Regular rate and rhythm. No murmurs, gallops or rubs. Abdomen:Soft. Bowel sounds present. Non-tender.  Extremities: No lower extremity edema. Pulses are 2 + in the bilateral DP/PT.  EKG: 11/04/11: NSR with PACs.   Echo September 2011:  Left ventricle: The cavity size was normal. Wall thickness was normal. Systolic function was normal. The estimated ejection fraction was in the range of 60% to 65%. Wall motion was normal; there were no regional wall motion  abnormalities. - Mitral valve: Mild regurgitation.  Assessment and Plan:   1. Chest pain: Atypical. I do not think this is cardiac related. No further workup. Likely musculoskeletal.   2. Palpitations: PACs. Avoid stimulants.   3. Mild MR: Last echo 2011. NO murmur on exam today.  Repeat echo in one year

## 2011-11-07 NOTE — Patient Instructions (Addendum)
Your physician wants you to follow-up in:  12 months.  You will receive a reminder letter in the mail two months in advance. If you don't receive a letter, please call our office to schedule the follow-up appointment.   

## 2012-01-08 ENCOUNTER — Encounter: Payer: Self-pay | Admitting: Cardiovascular Disease

## 2012-01-08 ENCOUNTER — Encounter (INDEPENDENT_AMBULATORY_CARE_PROVIDER_SITE_OTHER): Payer: BC Managed Care – PPO

## 2012-01-08 ENCOUNTER — Ambulatory Visit (INDEPENDENT_AMBULATORY_CARE_PROVIDER_SITE_OTHER): Payer: BC Managed Care – PPO | Admitting: Cardiovascular Disease

## 2012-01-08 ENCOUNTER — Telehealth: Payer: Self-pay | Admitting: *Deleted

## 2012-01-08 ENCOUNTER — Telehealth: Payer: Self-pay

## 2012-01-08 VITALS — BP 120/55 | HR 92 | Ht 64.8 in | Wt 131.0 lb

## 2012-01-08 DIAGNOSIS — R002 Palpitations: Secondary | ICD-10-CM

## 2012-01-08 DIAGNOSIS — R0789 Other chest pain: Secondary | ICD-10-CM

## 2012-01-08 LAB — BASIC METABOLIC PANEL
CO2: 26 mEq/L (ref 19–32)
Calcium: 9.3 mg/dL (ref 8.4–10.5)
Chloride: 102 mEq/L (ref 96–112)
Sodium: 136 mEq/L (ref 135–145)

## 2012-01-08 LAB — TSH: TSH: 0.09 u[IU]/mL — ABNORMAL LOW (ref 0.35–5.50)

## 2012-01-08 NOTE — Progress Notes (Signed)
Patient ID: Marissa Bailey, female   DOB: 10/11/1960, 51 y.o.   MRN: [id number] Place a 48 hrs Holter on patient on 01/08/12. I explained to her that she could not wash for 48 hrs and that the montior had to stay on at all times. I also told her that she could take the the monitor off on Saturday 01/10/12

## 2012-01-08 NOTE — Telephone Encounter (Signed)
Placed 48 hr holter monitor on patient 01/08/12

## 2012-01-08 NOTE — Patient Instructions (Signed)
Your physician recommends that you schedule a follow-up appointment in:  4-5 weeks.   Your physician has recommended that you wear a holter monitor. Holter monitors are medical devices that record the heart's electrical activity. Doctors most often use these monitors to diagnose arrhythmias. Arrhythmias are problems with the speed or rhythm of the heartbeat. The monitor is a small, portable device. You can wear one while you do your normal daily activities. This is usually used to diagnose what is causing palpitations/syncope (passing out).   Your physician has requested that you have an exercise tolerance test. Can be done with NP or PA.  For further information please visit [email or web address] Please also follow instruction sheet, as given.

## 2012-01-08 NOTE — Progress Notes (Signed)
History of Present Illness: [name] with history mild MR, PACs, anxiety and chest pain who is here today for cardiac follow-up. I saw her in September 2011 for evaluation of palpitations. Echo at that time showed normal LV size and function with mild MR. She was seen in the ED on 11/04/11 for CP with radiation to back. CTA negative for PE or aortic dissection. D-dimer mildly elevated. EKG without ischemia. I saw her 11/07/11 and her pain had resolved.   She is added onto my schedule today for evaluation of palpitations. She tells me that her heart has been racing for the last few days. She has noticed this at night. Last night, she felt this until she fell asleep. Still having some atypical chest pains with radiation into her back but this is not associated with her palpitations. She does drink caffeine.    Primary Care Physician: Marissa Bailey  Past Medical History  Diagnosis Date  . Migraines     menstrual  . Seasonal allergies   . Anxiety   . Heart palpitations     Past Surgical History  Procedure Date  . Exploratory laparotomy     for tubal pregnancy  . Vein surgery lower extremity     Current Outpatient Prescriptions  Medication Sig Dispense Refill  . aspirin 81 MG tablet Take 81 mg by mouth at bedtime.       . Cholecalciferol (VITAMIN D) 1000 UNITS capsule Take 2,000 Units by mouth daily.       . Echinacea 400 MG CAPS Take by mouth.        . fish oil-omega-3 fatty acids 1000 MG capsule Take 2 g by mouth daily.        Marland Kitchen liothyronine (CYTOMEL) 5 MCG tablet Take 15 mcg by mouth daily. Gets capsules compounded from [facility].      Marland Kitchen MAGNESIUM CARBONATE PO Take by mouth daily.      . Multiple Vitamin (MULTIVITAMIN WITH MINERALS) TABS Take 1 tablet by mouth daily.      . NON FORMULARY Apply 0.5 mLs topically daily. 70/30 mix of estriol and estradiol in a cream base. Compounded by [facility].      Marland Kitchen PRESCRIPTION MEDICATION Apply 0.1 mLs topically daily.  Testosterone 2% cream compounded by [facility].      . progesterone (PROMETRIUM) 100 MG capsule Take 100 mg by mouth at bedtime.      Marland Kitchen thyroid (ARMOUR) 60 MG tablet Take 60 mg by mouth daily.      . vitamin B-12 (CYANOCOBALAMIN) 1000 MCG tablet Take 1,000 mcg by mouth daily.      . vitamin C (ASCORBIC ACID) 500 MG tablet Take 1,000 mg by mouth daily.         No Known Allergies  History   Social History  . Marital Status: Married    Spouse Name: N/A    Number of Children: N/A  . Years of Education: N/A   Occupational History  . Not on file.   Social History Main Topics  . Smoking status: Never Smoker   . Smokeless tobacco: Not on file  . Alcohol Use: Yes     Comment: socially  . Drug Use: No  . Sexually Active:    Other Topics Concern  . Not on file   Social History Narrative   Regular exercise- yesHH of 2Pet dogsOccup: Bank teller/ stylistMarried second marriageCollege graduate no childrenG2 p1    Family History  Problem  Relation Age of Onset  . Heart attack Mother 1    brain impairment tobacco dm  . Diabetes Mother   . Hyperlipidemia Mother   . Other Sister     whole in heart age 30  . Cancer Paternal Grandmother     breast  . Stroke Paternal Grandmother     Review of Systems:  As stated in the HPI and otherwise negative.   BP 120/55  Pulse 92  Ht 5' 4.8" (1.646 m)  Wt 131 lb (59.421 kg)  BMI 21.93 kg/m2  SpO2 98%  Physical Examination: General: Well developed, well nourished, NAD HEENT: OP clear, mucus membranes moist SKIN: warm, dry. No rashes. Neuro: No focal deficits Musculoskeletal: Muscle strength 5/5 all ext Psychiatric: Mood and affect normal Neck: No JVD, no carotid bruits, no thyromegaly, no lymphadenopathy. Lungs:Clear bilaterally, no wheezes, rhonci, crackles Cardiovascular: Regular rate and rhythm. No murmurs, gallops or rubs. Abdomen:Soft. Bowel sounds present. Non-tender.  Extremities: No lower extremity edema. Pulses are 2  + in the bilateral DP/PT.  EKG: NSR, rate 90 bpm.  Normal  Assessment and Plan:   1. Palpitations: Likely premature beats. Will arrange 48 hour monitor. Will check TSH an BMET. She is known to have normal LV function.   2. Atypical Chest pain: Will arrange exercise treadmill stress test given her anxiety and concern about possible CAD since her mother had a ? Ischemic event in her 40s.

## 2012-01-08 NOTE — Telephone Encounter (Signed)
PT CALLED THIS AM COMPLAINING OF ERRATIC  HEART RATE FOR A COUPLE OF WEEKS  DOES NOT HAPPEN EVERY DAY   AND OCCURS DIFFER TIMES OF DAY   HAD 1 EPISODE   AT WORK THE OTHER DAY  AND HAS NOTED A COUPLE OF EPISODES IN THE EVENING  AFTER  WORK  WHILE   RELAXING   HAS NOTED A COUGH  WITH EPISODES AS WELL APPT MADE  WITH [facility] [employer]  FOR TODAY AT 9:50 AM .Zack Seal

## 2012-01-12 NOTE — Progress Notes (Signed)
Patient ID: Marissa Bailey, female   DOB: December 31, 1960, 51 y.o.   MRN: [id number] Placed a holter monitor on patient 01-08-12, with all the rush I forgot to turn the monitor on. So I called the patient explained to her that i forgot to turn the monitor on. I asked her if she would put the monitor back on for another two days and she said that she would. I also explained how to turn the monitor on and how to check to see if the electrodes were on right. I will let the rn know so that the MD will know.

## 2012-01-13 ENCOUNTER — Telehealth: Payer: Self-pay | Admitting: Cardiovascular Disease

## 2012-01-13 NOTE — Telephone Encounter (Signed)
Pt rt call to pat re lab work, pls call [phone]

## 2012-01-13 NOTE — Telephone Encounter (Signed)
Spoke with pt and reviewed TSH results with her. She will follow up with Dr. Fabian Sharp.  Will route labs to Dr. Fabian Sharp.

## 2012-01-14 ENCOUNTER — Ambulatory Visit: Payer: BC Managed Care – PPO | Admitting: Physician Assistant

## 2012-01-16 ENCOUNTER — Telehealth: Payer: Self-pay | Admitting: Internal Medicine

## 2012-01-16 DIAGNOSIS — E079 Disorder of thyroid, unspecified: Secondary | ICD-10-CM

## 2012-01-16 MED ORDER — LEVOTHYROXINE SODIUM 75 MCG PO TABS: 75.0000 ug | ORAL_TABLET | Freq: Every day | ORAL | Status: DC

## 2012-01-16 NOTE — Telephone Encounter (Signed)
Yesterday talked with patient  In person and on phone  about TSH being low ;obtained by cardiology. This could be aggravating culprit for palpitations. She's been getting T3 and Armour Thyroid from Dr. Consuelo Pandy previously. 15 mg and 60 mg respectively. It has been monitored by a physician. However she will change over for monitoring.  She was put on it for an abnormal thyroid test and weight gain. Samples given to her of Synthroid 75 mg to take one a day enough for 2 months she can maintain on the 15 mg of the T3 and recheck TSH in about 6-8 weeks and then a followup visit.   We still may need to adjust medication down discussed with her the ins and outs of Armour Thyroid and why it is not routinely recommended because of side effects and consistency of dosing.  Patient agrees call us in the meantime if she runs out of samples.

## 2012-01-20 ENCOUNTER — Telehealth: Payer: Self-pay | Admitting: Internal Medicine

## 2012-01-20 NOTE — Telephone Encounter (Signed)
Caller: Marissa Bailey/Patient; Phone: [phone]; Reason for Call: Pt was changed to a different thyroid medication last Friday 11/15 d/t heart palpitations.  Pt is calling MD to let her know that she feels the exact same symptoms on this brand and dose.  Pt wants to know what she should do.  Pt is attempting to cut down on her caffiene.  She has 2-3 cups of coffee in the am. Office please call pt back

## 2012-01-20 NOTE — Telephone Encounter (Signed)
Stop the T3 for a week and see if  Sx improve. And just stay on the 75 synthroid    .( If not then we may need to decrease the synthroid to .05 mg per day )  Call us  After aa week of  This,

## 2012-01-21 NOTE — Telephone Encounter (Signed)
Patient notified of all.  Instructed to call back in one week.

## 2012-01-26 ENCOUNTER — Ambulatory Visit (INDEPENDENT_AMBULATORY_CARE_PROVIDER_SITE_OTHER): Payer: BC Managed Care – PPO | Admitting: Physician Assistant

## 2012-01-26 DIAGNOSIS — R002 Palpitations: Secondary | ICD-10-CM

## 2012-01-26 NOTE — Procedures (Signed)
Exercise Treadmill Test  Pre-Exercise Testing Evaluation Rhythm: normal sinus  Rate: 74   PR:  .14 QRS:  .08  QT:  .40 QTc: .45     Test  Exercise Tolerance Test Ordering MD: Melene Muller, MD  Interpreting MD: Tereso Newcomer PA  Unique Test No: 1 Treadmill:  1  Indication for ETT: PALPITATIONS  Contraindication to ETT: Yes   Stress Modality: exercise - treadmill  Cardiac Imaging Performed: non   Protocol: standard Bruce - maximal  Max BP:  169/53  Max MPHR (bpm):  167 85% MPR (bpm):  142  MPHR obtained (bpm):  162 % MPHR obtained:  97%  Reached 85% MPHR (min:sec):  4:30 Total Exercise Time (min-sec):  9:02  Workload in METS:  10.1 Borg Scale: 13  Reason ETT Terminated:  desired heart rate attained    ST Segment Analysis At Rest: normal ST segments - no evidence of significant ST depression With Exercise: no evidence of significant ST depression  Other Information Arrhythmia:  No Angina during ETT:  absent (0) Quality of ETT:  diagnostic  ETT Interpretation:  normal - no evidence of ischemia by ST analysis  Comments: Good exercise tolerance. No chest pain.  She did have back pain during exercise.  Normal BP response to exercise. No ST-T changes to suggest ischemia.   Recommendations: Follow up with Dr. Verne Carrow as directed. Signed,  Tereso Newcomer, PA-C  11:40 AM 01/26/2012

## 2012-01-28 ENCOUNTER — Other Ambulatory Visit: Payer: Self-pay | Admitting: *Deleted

## 2012-01-28 DIAGNOSIS — R002 Palpitations: Secondary | ICD-10-CM

## 2012-01-28 NOTE — Telephone Encounter (Signed)
I called pt to review monitor results. Left message to call back 

## 2012-02-04 NOTE — Telephone Encounter (Signed)
Left message to call back  

## 2012-02-05 ENCOUNTER — Ambulatory Visit: Payer: BC Managed Care – PPO | Admitting: Cardiovascular Disease

## 2012-02-06 MED ORDER — DILTIAZEM HCL ER COATED BEADS 120 MG PO CP24: 120.0000 mg | ORAL_CAPSULE | Freq: Every day | ORAL | Status: DC

## 2012-02-06 NOTE — Telephone Encounter (Signed)
Spoke with pt and reviewed monitor results with her and instructions from Dr. Clifton James.  Will send prescription for Card[facility]em CD [facility] [address]  She has follow up appt with Dr. Clifton James on February 10, 2012.  She has been in touch with primary care regarding TSH.

## 2012-02-10 ENCOUNTER — Encounter: Payer: Self-pay | Admitting: Cardiovascular Disease

## 2012-02-10 ENCOUNTER — Ambulatory Visit (INDEPENDENT_AMBULATORY_CARE_PROVIDER_SITE_OTHER): Payer: BC Managed Care – PPO | Admitting: Cardiovascular Disease

## 2012-02-10 VITALS — BP 118/58 | HR 63 | Ht 63.0 in | Wt 138.0 lb

## 2012-02-10 DIAGNOSIS — R0789 Other chest pain: Secondary | ICD-10-CM

## 2012-02-10 DIAGNOSIS — I491 Atrial premature depolarization: Secondary | ICD-10-CM

## 2012-02-10 NOTE — Patient Instructions (Addendum)
Your physician wants you to follow-up in:  6 months. You will receive a reminder letter in the mail two months in advance. If you don't receive a letter, please call our office to schedule the follow-up appointment.   

## 2012-02-10 NOTE — Progress Notes (Signed)
History of Present Illness: [name] with history mild MR, PACs, anxiety and chest pain who is here today for cardiac follow-up. I saw her in September 2011 for evaluation of palpitations. Echo at that time showed normal LV size and function with mild MR. She was seen in the ED on 11/04/11 for CP with radiation to back. CTA negative for PE or aortic dissection. D-dimer mildly elevated. EKG without ischemia. I saw her 11/07/11 and her pain had resolved. She was added onto my schedule on 01/08/12 for evaluation of palpitations. She told me that her heart had been racing for several days. She had noticed this at night. She was still having some atypical chest pains with radiation into her back but this was not associated with her palpitations. I arranged an exercise treadmill stress test which showed no EKG changes c/w ischemia. 48 hour monitor with NSR, PACs and several short runs of SVT (15 beats). She was found to have a low TSH at last visit so she was referred to primary care for advice and has had her thyroid replacement therapy altered.  She does drink caffeine but she has but back. I started Cardizem CD 120 mg po QDaily.  She is here today for follow up. She is overall feeling much better. No awareness of palpitations since starting the Cardizem. No chest pain or SOB. Occasional back pains.   Primary Care Physician: [name]  Last Lipid Profile:Lipid Panel     Component Value Date/Time   CHOL 177 09/18/2009 1621   TRIG 109.0 09/18/2009 1621   HDL 76.50 09/18/2009 1621   CHOLHDL 2 09/18/2009 1621   VLDL 21.8 09/18/2009 1621   LDLCALC 79 09/18/2009 1621     Past Medical History  Diagnosis Date  . Migraines     menstrual  . Seasonal allergies   . Anxiety   . Heart palpitations     Past Surgical History  Procedure Date  . Exploratory laparotomy     for tubal pregnancy  . Vein surgery lower extremity     Current Outpatient Prescriptions  Medication Sig Dispense Refill  . aspirin  81 MG tablet Take 81 mg by mouth at bedtime.       . Cholecalciferol (VITAMIN D) 1000 UNITS capsule Take 2,000 Units by mouth daily.       Marland Kitchen diltiazem (CARDIZEM CD) 120 MG 24 hr capsule Take 1 capsule (120 mg total) by mouth daily.  30 capsule  11  . Echinacea 400 MG CAPS Take by mouth.        . fish oil-omega-3 fatty acids 1000 MG capsule Take 2 g by mouth daily.        Marland Kitchen levothyroxine (SYNTHROID, LEVOTHROID) 75 MCG tablet Take 1 tablet (75 mcg total) by mouth daily.  56 tablet  0  . liothyronine (CYTOMEL) 5 MCG tablet Take 15 mcg by mouth daily. Gets capsules compounded from [facility].      Marland Kitchen MAGNESIUM CARBONATE PO Take by mouth daily.      . Multiple Vitamin (MULTIVITAMIN WITH MINERALS) TABS Take 1 tablet by mouth daily.      . NON FORMULARY Apply 0.5 mLs topically daily. 70/30 mix of estriol and estradiol in a cream base. Compounded by [facility].      Marland Kitchen PRESCRIPTION MEDICATION Apply 0.1 mLs topically daily. Testosterone 2% cream compounded by [facility].      . progesterone (PROMETRIUM) 100 MG capsule Take 100 mg by mouth  at bedtime.      . vitamin B-12 (CYANOCOBALAMIN) 1000 MCG tablet Take 1,000 mcg by mouth daily.      . vitamin C (ASCORBIC ACID) 500 MG tablet Take 1,000 mg by mouth daily.         No Known Allergies  History   Social History  . Marital Status: Married    Spouse Name: N/A    Number of Children: N/A  . Years of Education: N/A   Occupational History  . Not on file.   Social History Main Topics  . Smoking status: Never Smoker   . Smokeless tobacco: Not on file  . Alcohol Use: Yes     Comment: socially  . Drug Use: No  . Sexually Active:    Other Topics Concern  . Not on file   Social History Narrative   Regular exercise- yesHH of 2Pet dogsOccup: Bank teller/ stylistMarried second marriageCollege graduate no childrenG2 p1    Family History  Problem Relation Age of Onset  . Heart attack Mother 58    brain impairment  tobacco dm  . Diabetes Mother   . Hyperlipidemia Mother   . Other Sister     whole in heart age 3  . Cancer Paternal Grandmother     breast  . Stroke Paternal Grandmother     Review of Systems:  As stated in the HPI and otherwise negative.   BP 118/58  Pulse 63  Ht 5\' 3"  (1.6 m)  Wt 138 lb (62.596 kg)  BMI 24.45 kg/m2  Physical Examination: General: Well developed, well nourished, NAD HEENT: OP clear, mucus membranes moist SKIN: warm, dry. No rashes. Neuro: No focal deficits Musculoskeletal: Muscle strength 5/5 all ext Psychiatric: Mood and affect normal Neck: No JVD, no carotid bruits, no thyromegaly, no lymphadenopathy. Lungs:Clear bilaterally, no wheezes, rhonci, crackles Cardiovascular: Regular rate and rhythm. No murmurs, gallops or rubs. Abdomen:Soft. Bowel sounds present. Non-tender.  Extremities: No lower extremity edema. Pulses are 2 + in the bilateral DP/PT.  Exercise stress test 01/26/12: Protocol: standard Bruce - maximal Max BP: 169/53  Max MPHR (bpm): 167 85% MPR (bpm): 142  MPHR obtained (bpm): 162 % MPHR obtained: 97%  Reached 85% MPHR (min:sec): 4:30 Total Exercise Time (min-sec):  9:02  Workload in METS: 10.1 Borg Scale: 13  Reason ETT Terminated: desired heart rate attained   ST Segment Analysis At Rest: normal ST segments - no evidence of significant ST  depression With Exercise: no evidence of significant ST depression  Other Information Arrhythmia: No Angina during ETT: absent (0) Quality of ETT: diagnostic  ETT Interpretation: normal - no evidence of ischemia by ST  analysis  Comments: Good exercise tolerance. No chest pain. She did have back pain during exercise.  Normal BP response to exercise. No ST-T changes to suggest ischemia.   48 Holter monitor: NSR, PACs, several shorts runs of SVT  Assessment and Plan:   1. Palpitations: Likely related to findings of PACs, SVT on monitor. This may be exacerbated by her thyroid issues.  Continue Cardizem CD for now. I have reassured here that her LV function is normal and this is a benign finding. She is counseled on factors that may exacerbate her symptoms.   2. Atypical Chest pain: Treadmill stress test without ischemia. No further ischemic workup necessary.

## 2013-05-13 ENCOUNTER — Telehealth: Payer: Self-pay | Admitting: Internal Medicine

## 2013-05-13 NOTE — Telephone Encounter (Signed)
Kara from [facility] is requesting last lab and last ov notes to be faxed to her Attention at [phone].  Thanks.

## 2013-05-16 NOTE — Telephone Encounter (Signed)
lmom at work for patient to return my call.  Pt must complete medical release form, we did not [facility] to [facility].

## 2013-05-27 LAB — HM COLONOSCOPY

## 2013-06-13 ENCOUNTER — Encounter: Payer: Self-pay | Admitting: Internal Medicine

## 2013-08-27 ENCOUNTER — Other Ambulatory Visit: Payer: Self-pay | Admitting: Internal Medicine

## 2013-08-30 ENCOUNTER — Other Ambulatory Visit: Payer: Self-pay | Admitting: Internal Medicine

## 2013-08-30 NOTE — Telephone Encounter (Signed)
Denied.  Not seen since 08/26/2010.

## 2014-01-02 ENCOUNTER — Encounter: Payer: Self-pay | Admitting: Cardiovascular Disease

## 2014-07-19 ENCOUNTER — Other Ambulatory Visit: Payer: Self-pay | Admitting: Obstetrics and Gynecology

## 2014-07-19 DIAGNOSIS — N644 Mastodynia: Secondary | ICD-10-CM

## 2014-07-21 ENCOUNTER — Ambulatory Visit
Admission: RE | Admit: 2014-07-21 | Discharge: 2014-07-21 | Disposition: A | Discharge status: Home / Self Care | Payer: BLUE CROSS/BLUE SHIELD | Source: Ambulatory Visit | Attending: Obstetrics and Gynecology | Admitting: Obstetrics and Gynecology

## 2014-07-21 DIAGNOSIS — N644 Mastodynia: Secondary | ICD-10-CM

## 2015-09-26 DIAGNOSIS — Z1212 Encounter for screening for malignant neoplasm of rectum: Secondary | ICD-10-CM | POA: Diagnosis not present

## 2015-09-26 DIAGNOSIS — Z6827 Body mass index (BMI) 27.0-27.9, adult: Secondary | ICD-10-CM | POA: Diagnosis not present

## 2015-09-26 DIAGNOSIS — Z1231 Encounter for screening mammogram for malignant neoplasm of breast: Secondary | ICD-10-CM | POA: Diagnosis not present

## 2015-09-26 DIAGNOSIS — Z01419 Encounter for gynecological examination (general) (routine) without abnormal findings: Secondary | ICD-10-CM | POA: Diagnosis not present

## 2015-10-02 DIAGNOSIS — Z131 Encounter for screening for diabetes mellitus: Secondary | ICD-10-CM | POA: Diagnosis not present

## 2015-10-02 DIAGNOSIS — Z13220 Encounter for screening for lipoid disorders: Secondary | ICD-10-CM | POA: Diagnosis not present

## 2015-10-02 DIAGNOSIS — Z130 Encounter for screening for diseases of the blood and blood-forming organs and certain disorders involving the immune mechanism: Secondary | ICD-10-CM | POA: Diagnosis not present

## 2015-10-02 DIAGNOSIS — Z13228 Encounter for screening for other metabolic disorders: Secondary | ICD-10-CM | POA: Diagnosis not present

## 2015-10-17 ENCOUNTER — Ambulatory Visit (INDEPENDENT_AMBULATORY_CARE_PROVIDER_SITE_OTHER): Payer: BLUE CROSS/BLUE SHIELD | Admitting: Cardiology

## 2015-10-17 ENCOUNTER — Encounter (INDEPENDENT_AMBULATORY_CARE_PROVIDER_SITE_OTHER): Payer: Self-pay

## 2015-10-17 ENCOUNTER — Encounter: Payer: Self-pay | Admitting: Cardiology

## 2015-10-17 VITALS — BP 128/44 | HR 70 | Ht 63.0 in | Wt 155.0 lb

## 2015-10-17 DIAGNOSIS — I340 Nonrheumatic mitral (valve) insufficiency: Secondary | ICD-10-CM | POA: Diagnosis not present

## 2015-10-17 DIAGNOSIS — R0789 Other chest pain: Secondary | ICD-10-CM | POA: Diagnosis not present

## 2015-10-17 DIAGNOSIS — R011 Cardiac murmur, unspecified: Secondary | ICD-10-CM

## 2015-10-17 NOTE — Patient Instructions (Signed)
Medication Instructions:   Your physician recommends that you continue on your current medications as directed. Please refer to the Current Medication list given to you today.    If you need a refill on your cardiac medications before your next appointment, please call your pharmacy.  Labwork: NONE ORDER TODAY    Testing/Procedures: Your physician has requested that you have a lexiscan myoview. For further information please visit [email or web address]. Please follow instruction sheet, as given.  Your physician has requested that you have an echocardiogram. Echocardiography is a painless test that uses sound waves to create images of your heart. It provides your doctor with information about the size and shape of your heart and how well your heart's chambers and valves are working. This procedure takes approximately one hour. There are no restrictions for this procedure.     Follow-Up: IN 2 WEEKS WITH BRITTAINY SIMMONS OR AVAILABLE PA/NP     Any Other Special Instructions Will Be Listed Below (If Applicable).

## 2015-10-17 NOTE — Progress Notes (Signed)
10/17/2015 Marissa Bailey   Mar 25, 1960  [id number]  Primary Physician Lorretta HarpPANOSH,WANDA KOTVAN, MD Primary Cardiologist: Dr. Clifton JamesMcAlhany  Reason for Visit/CC: F/u for MR, re-establish cardiac care   HPI:  The patient is a 55 year old female, followed Dr. Clifton JamesMcAlhany, with a history of mild MR, PACs and atypical chest pain with normal ETT in 2013. 2D Echo in 2011 showed normal LV size and function with mild MR.   She also has a strong family history of premature CAD. Her mother suffered a myocardial infarction and cardiac arrest at the age of 946. This left her with anoxic brain injury. Her youngest brother also suffered a cardiac arrest, also at the age of 246. He now has an ICD. She denies any personal history of hypertension and diabetes. No history of tobacco use. She does however have has a history of hyper-triglyceridemia. This was recently checked by her gynecologist and she was told that she had elevated triglyceride levels. She was advised to reduce her fat intake. She is not currently on any medication for this.  She has not been seen in clinic since 2013. She reports that she presents back for reevaluation for cardiac checkup. Given her family history, she has concerns for possible cardiac disease. She denies any recent substernal chest discomfort however she does report that several days ago she awoke with left arm pain, left scapular pain and neck pain. It lasted only several minutes before spontaneously resolving. She denies any recurrence since then. She typically does not have any limitations with physical activity. She walks her dogs daily and denies any exertional chest pain or dyspnea. She also reports that she can climb a flight of stairs without limitations. She denies any resting dyspnea. She occasionally feels lightheaded but no severe dizziness. No syncope/near-syncope.  EKG today shows normal sinus rhythm with LVH. CT/QTc is stable fat 430/464 ms. Her blood pressure is well controlled at  128/44. Pulse rate is 70 bpm.   Current Outpatient Prescriptions  Medication Sig Dispense Refill  . aspirin 81 MG tablet Take 81 mg by mouth at bedtime.     . Cholecalciferol (VITAMIN D) 1000 UNITS capsule Take 2,000 Units by mouth daily.     . Echinacea 400 MG CAPS Take by mouth.      . fish oil-omega-3 fatty acids 1000 MG capsule Take 2 g by mouth daily.      Marland Kitchen. levothyroxine (SYNTHROID, LEVOTHROID) 75 MCG tablet Take 1 tablet (75 mcg total) by mouth daily. 56 tablet 0  . liothyronine (CYTOMEL) 5 MCG tablet Take 15 mcg by mouth daily. Gets 15mcg capsules compounded from [facility].    Marland Kitchen. MAGNESIUM CARBONATE PO Take by mouth daily.    . Multiple Vitamin (MULTIVITAMIN WITH MINERALS) TABS Take 1 tablet by mouth daily.    . NON FORMULARY Apply 0.5 mLs topically daily. 70/30 mix of estriol and estradiol in a cream base. [facility]ounded [facility].    Marland Kitchen. PRESCRIPTION MEDICATION Apply 0.1 mLs topically daily. Testosterone 2% cream comp[facility] by [facility].    . progesterone (PROMETRIUM) 100 MG capsule Take 100 mg by mouth at bedtime.    . vitamin B-12 (CYANOCOBALAMIN) 1000 MCG tablet Take 1,000 mcg by mouth daily.    . vitamin C (ASCORBIC ACID) 500 MG tablet Take 1,000 mg by mouth daily.      No current facility-administered medications for this visit.     No Known Allergies  Social History   Social History  .  Marital status: Married    Spouse name: N/A  . Number of children: N/A  . Years of education: N/A   Occupational History  . Not on file.   Social History Main Topics  . Smoking status: Never Smoker  . Smokeless tobacco: Never Used  . Alcohol use Yes     Comment: socially  . Drug use: No  . Sexual activity: Not on file   Other Topics Concern  . 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  Cardiovascular: negative for chest pain, dyspnea on exertion, edema, orthopnea, palpitations, paroxysmal nocturnal dyspnea or shortness of breath Dermatological: negative for rash Respiratory: negative for cough or wheezing Urologic: negative for hematuria Abdominal: negative for nausea, vomiting, diarrhea, bright red blood per rectum, melena, or hematemesis Neurologic: negative for visual changes, syncope, or dizziness All other systems reviewed and are otherwise negative except as noted above.    Blood pressure (!) 128/44, pulse 70, height 5\' 3"  (1.6 m), weight 155 lb (70.3 kg).  General appearance: alert, cooperative and no distress Neck:  no JVD Lungs: clear to auscultation bilaterally Heart: regular rate and rhythm, S1, S2 normal,  3/6 SM loudest at the RUSB + radiation to carotids. Extremities: no LEE Pulses: 2+ and symmetric Skin: warm and dry Neurologic: Grossly normal  EKG NSR. 70 bpm LVH  ASSESSMENT AND PLAN:   1. Cardiac Murmur: Patient has 3/6 systolic murmur, loudest at right upper sternal border radiating to bilateral carotids. This is concerning for aortic valve stenosis. She also has a history of mild mitral regurgitation noted at time of her last 2-D echocardiogram in 2011. We will repeat an echocardiogram to reassess her cardiac valves. Her EKG also shows LVH. We will assess LV function, wall thickness and wall motion.  2. Left arm/scapular/neck pain: She had recent episode of pain. She denies any substernal chest pain. Given her risk factors including strong family history of premature CAD hypertriglyceridemia and abnormal cardiac exam concerning for aortic stenosis, we will obtain a Lexiscan nuclear stress test to rule out underlying coronary ischemia. Patient has been advised to continue daily aspirin, 81 mg. We will also attempt to obtain recent lipid panel from her gynecologist, Dr.  Deidre AlaMichelle Graywall.  3. Hypertriglyceridemia: She recently had lab work conducted at her gynecologist office and was told that she had elevated triglyceride levels. These records are not available in Epic. We will also attempt to obtain recent lipid panel from her gynecologist, Dr. Deidre AlaMichelle Graywall.   PLAN  F/u in 2 weeks after NST and 2D echo.   Robbie LisBrittainy Dylan Monforte PA-C 10/17/2015 4:47 PM

## 2015-10-22 ENCOUNTER — Telehealth (HOSPITAL_COMMUNITY): Payer: Self-pay | Admitting: *Deleted

## 2015-10-22 NOTE — Telephone Encounter (Signed)
Patient given detailed instructions per Myocardial Perfusion Study Information Sheet for the test on 10/24/15 at 0730. Patient notified to arrive 15 minutes early and that it is imperative to arrive on time for appointment to keep from having the test rescheduled.  If you need to cancel or reschedule your appointment, please call the office within 24 hours of your appointment. Failure to do so may result in a cancellation of your appointment, and a $50 no show fee. Patient verbalized understanding.Marissa Bailey W    

## 2015-10-23 ENCOUNTER — Telehealth (HOSPITAL_COMMUNITY): Payer: Self-pay | Admitting: Radiology

## 2015-10-23 NOTE — Telephone Encounter (Signed)
Patient given detailed instructions per Myocardial Perfusion Study Information Sheet for the test on 10/24/2015 at 7:30. Patient notified to arrive 15 minutes early and that it is imperative to arrive on time for appointment to keep from having the test rescheduled.  If you need to cancel or reschedule your appointment, please call the office within 24 hours of your appointment. Failure to do so may result in a cancellation of your appointment, and a $50 no show fee. Patient verbalized understanding.EHK

## 2015-10-24 ENCOUNTER — Ambulatory Visit (HOSPITAL_COMMUNITY): Payer: BLUE CROSS/BLUE SHIELD | Attending: Cardiology

## 2015-10-24 DIAGNOSIS — R42 Dizziness and giddiness: Secondary | ICD-10-CM | POA: Insufficient documentation

## 2015-10-24 DIAGNOSIS — R0789 Other chest pain: Secondary | ICD-10-CM | POA: Diagnosis not present

## 2015-10-24 DIAGNOSIS — Z8249 Family history of ischemic heart disease and other diseases of the circulatory system: Secondary | ICD-10-CM | POA: Insufficient documentation

## 2015-10-24 DIAGNOSIS — I119 Hypertensive heart disease without heart failure: Secondary | ICD-10-CM | POA: Insufficient documentation

## 2015-10-24 LAB — MYOCARDIAL PERFUSION IMAGING
CHL CUP MPHR: 164 {beats}/min
CHL CUP NUCLEAR SSS: 5
CHL RATE OF PERCEIVED EXERTION: 18
CSEPED: 9 min
CSEPHR: 94 %
Estimated workload: 10.1 METS
Exercise duration (sec): 0 s
LHR: 0.28
LV dias vol: 171 mL (ref 46–106)
LV sys vol: 90 mL
Peak HR: 155 {beats}/min
Rest HR: 68 {beats}/min
SDS: 3
SRS: 2
TID: 1.12

## 2015-10-24 MED ORDER — TECHNETIUM TC 99M TETROFOSMIN IV KIT
10.6000 | PACK | Freq: Once | INTRAVENOUS | Status: AC | PRN
  Administered 2015-10-24: 11 via INTRAVENOUS
  Filled 2015-10-24: qty 11

## 2015-10-24 MED ORDER — TECHNETIUM TC 99M TETROFOSMIN IV KIT
31.6000 | PACK | Freq: Once | INTRAVENOUS | Status: AC | PRN
Start: 2015-10-24 — End: 2015-10-24
  Administered 2015-10-24: 32 via INTRAVENOUS
  Filled 2015-10-24: qty 32

## 2015-10-26 ENCOUNTER — Telehealth: Payer: Self-pay | Admitting: Cardiovascular Disease

## 2015-10-26 NOTE — Telephone Encounter (Signed)
Notified of nuclear stress test results.  She verbalizes understanding.

## 2015-10-26 NOTE — Telephone Encounter (Signed)
New message ° ° ° °Returning nurse call. Please call.  °

## 2015-10-29 ENCOUNTER — Ambulatory Visit (HOSPITAL_COMMUNITY): Payer: BLUE CROSS/BLUE SHIELD | Attending: Cardiology

## 2015-10-29 ENCOUNTER — Other Ambulatory Visit: Payer: Self-pay

## 2015-10-29 DIAGNOSIS — I77810 Thoracic aortic ectasia: Secondary | ICD-10-CM | POA: Diagnosis not present

## 2015-10-29 DIAGNOSIS — I340 Nonrheumatic mitral (valve) insufficiency: Secondary | ICD-10-CM

## 2015-10-29 DIAGNOSIS — Z8249 Family history of ischemic heart disease and other diseases of the circulatory system: Secondary | ICD-10-CM | POA: Insufficient documentation

## 2015-10-29 DIAGNOSIS — R011 Cardiac murmur, unspecified: Secondary | ICD-10-CM

## 2015-10-29 DIAGNOSIS — I351 Nonrheumatic aortic (valve) insufficiency: Secondary | ICD-10-CM | POA: Diagnosis not present

## 2015-10-31 ENCOUNTER — Telehealth: Payer: Self-pay | Admitting: Cardiovascular Disease

## 2015-10-31 NOTE — Telephone Encounter (Signed)
Spoke with pt and reviewed echo results with her. She plans to be here for appt with Jacolyn ReedyMichele Lenze, PA on 9/5

## 2015-10-31 NOTE — Telephone Encounter (Signed)
New message ° ° ° ° ° ° °Pt returning nurse call  °

## 2015-11-06 ENCOUNTER — Encounter: Payer: Self-pay | Admitting: Physician Assistant

## 2015-11-06 ENCOUNTER — Ambulatory Visit (INDEPENDENT_AMBULATORY_CARE_PROVIDER_SITE_OTHER): Payer: BLUE CROSS/BLUE SHIELD | Admitting: Physician Assistant

## 2015-11-06 ENCOUNTER — Other Ambulatory Visit: Payer: Self-pay | Admitting: Physician Assistant

## 2015-11-06 VITALS — BP 152/58 | HR 76 | Ht 63.0 in | Wt 153.4 lb

## 2015-11-06 DIAGNOSIS — I351 Nonrheumatic aortic (valve) insufficiency: Secondary | ICD-10-CM | POA: Diagnosis not present

## 2015-11-06 DIAGNOSIS — R002 Palpitations: Secondary | ICD-10-CM

## 2015-11-06 LAB — BASIC METABOLIC PANEL
BUN: 15 mg/dL (ref 7–25)
CALCIUM: 10 mg/dL (ref 8.6–10.4)
CHLORIDE: 102 mmol/L (ref 98–110)
CO2: 26 mmol/L (ref 20–31)
CREATININE: 0.92 mg/dL (ref 0.50–1.05)
GLUCOSE: 122 mg/dL — AB (ref 65–99)
Potassium: 4 mmol/L (ref 3.5–5.3)
SODIUM: 137 mmol/L (ref 135–146)

## 2015-11-06 LAB — PROTIME-INR
INR: 1
Prothrombin Time: 11 s (ref 9.0–11.5)

## 2015-11-06 NOTE — Patient Instructions (Signed)
[facility] follow instructions given  Lab today ( bmet,INR )   Your physician recommends that you schedule a follow-up appointment in: 2 weeks with Dr.McAlhany

## 2015-11-06 NOTE — Addendum Note (Signed)
Addended by: Sydnee CabalMACK, CHASITIE R on: 11/06/2015 10:41 AM   Modules accepted: Orders

## 2015-11-06 NOTE — Progress Notes (Signed)
Cardiology Office Note    Date:  11/06/2015   ID:  Marissa Bailey, DOB 06/22/1960, MRN [id number]  PCP:  Lorretta Harp, MD  Cardiologist: Dr. Clifton James  Chief Complaint  Patient presents with  . Follow-up    pt states that she can feel the strong beats of her heart     History of Present Illness:  Marissa Bailey is a 55 y.o. female  with a history of mild MR, PACs and atypical chest pain with normal ETT in 2013. 2D Echo in 2011 showed normal LV size and function with mild MR.    She also has a strong family history of premature CAD. Her mother suffered a myocardial infarction and cardiac arrest at the age of 63. This left her with anoxic brain injury. Her youngest brother also suffered a cardiac arrest, also at the age of 37. He now has an ICD. She denies any personal history of hypertension and diabetes. No history of tobacco use. She does however have has a history of hyper-triglyceridemia. This was recently checked by her gynecologist and she was told that she had elevated triglyceride levels. She was advised to reduce her fat intake. She is not currently on any medication for this.  She saw Boyce Medici, PA-C 10/17/15. She ordered a 2-D echo because of concern for aortic valve disease on exam. She was also having some left arm and neck pain and order a Lexi scan Myoview. 2-D echo showed normal systolic function ejection fraction 50-55%.Aortic valve showed severe AI, aortic root dimension 38 mm and ascending aorta was mildly dilated. Nuclear stress test EF 47% with mild LVE and diffuse hypokinesis no evidence of ischemia or infarction. Dr. Clifton James reviewed her echo and recommends TEE.  Echo reviewed with patient. Patient has been running/walking and gets short of breath with the running. When she lays down at night notices her heart skipping and racing. Doesn't notice it during the day.   Past Medical History:  Diagnosis Date  . Anxiety   . Heart palpitations   . Migraines    menstrual  . Seasonal allergies     Past Surgical History:  Procedure Laterality Date  . EXPLORATORY LAPAROTOMY     for tubal pregnancy  . vein surgery lower extremity      Current Medications: Outpatient Medications Prior to Visit  Medication Sig Dispense Refill  . aspirin 81 MG tablet Take 81 mg by mouth at bedtime.     . Cholecalciferol (VITAMIN D) 1000 UNITS capsule Take 2,000 Units by mouth daily.     . Echinacea 400 MG CAPS Take by mouth.      . fish oil-omega-3 fatty acids 1000 MG capsule Take 2 g by mouth daily.      Marland Kitchen levothyroxine (SYNTHROID, LEVOTHROID) 75 MCG tablet Take 1 tablet (75 mcg total) by mouth daily. 56 tablet 0  . liothyronine (CYTOMEL) 5 MCG tablet Take 15 mcg by mouth daily. Gets capsules compounded from [facility].    Marland Kitchen MAGNESIUM CARBONATE PO Take by mouth daily.    . Multiple Vitamin (MULTIVITAMIN WITH MINERALS) TABS Take 1 tablet by mouth daily.    . NON FORMULARY Apply 0.5 mLs topically daily. 70/30 mix of estriol and estradiol in a cream base. Compounded by [facility]    Marland Kitchen PRESCRIPTION MEDICATION Apply 0.1 mLs topically daily. Testosterone 2% cream compounded by [facility].    . progesterone (PROMETRIUM) 100 MG capsule Take 100 mg by mouth at bedtime.    Marland Kitchen  vitamin B-12 (CYANOCOBALAMIN) 1000 MCG tablet Take 1,000 mcg by mouth daily.    . vitamin C (ASCORBIC ACID) 500 MG tablet Take 1,000 mg by mouth daily.      No facility-administered medications prior to visit.      Allergies:   Review of patient's allergies indicates no known allergies.   Social History   Social History  . Marital status: Married    Spouse name: N/A  . Number of children: N/A  . Years of education: N/A   Social History Main Topics  . Smoking status: Never Smoker  . Smokeless tobacco: Never Used  . Alcohol use Yes     Comment: socially  . Drug use: No  . Sexual activity: Not Asked   Other Topics Concern  . 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   ROS:   Please see the history of present illness.    Review of Systems  Constitution: Negative.  HENT: Negative.   Eyes: Negative.   Cardiovascular: Positive for dyspnea on exertion, irregular heartbeat and palpitations.  Respiratory: Negative.   Hematologic/Lymphatic: Negative.   Musculoskeletal: Negative.  Negative for joint pain.  Gastrointestinal: Negative.   Genitourinary: Negative.   Neurological: Negative.    All other systems reviewed and are negative.   PHYSICAL EXAM:   VS:  BP (!) 152/58   Pulse 76   Ht 5\' 3"  (1.6 m)   Wt 153 lb 6.4 oz (69.6 kg)   SpO2 97%   BMI 27.17 kg/m   Physical Exam  GEN: Well nourished, well developed, in no acute distress  HEENT: normal  Neck: no JVD, carotid bruits, or masses Cardiac:RRR; 1 to 2/6 systolic murmur at the left sternal border, 3/6 diastolic murmur at the left sternal border, no rubs, or gallops  Respiratory:  clear to auscultation bilaterally, normal work of breathing GI: soft, nontender, nondistended, + BS Ext: without cyanosis, clubbing, or edema, Good distal pulses bilaterally MS: no deformity or atrophy  Skin: warm and dry, no rash Neuro:  Alert and Oriented x 3, Strength and sensation are intact Psych: euthymic mood, full affect  Wt Readings from Last 3 Encounters:  11/06/15 153 lb 6.4 oz (69.6 kg)  10/17/15 155 lb (70.3 kg)  02/10/12 138 lb (62.6 kg)      Studies/Labs Reviewed:   EKG:  EKG is not ordered today.    Recent Labs: No results found for requested labs within last 8760 hours.   Lipid Panel     Component Value Date/Time   CHOL 177 09/18/2009 1621   TRIG 109.0 09/18/2009 1621   HDL 76.50 09/18/2009 1621   CHOLHDL 2 09/18/2009 1621   VLDL 21.8 09/18/2009 1621   LDLCALC 79 09/18/2009 1621    Additional studies/ records that were reviewed today include:  Study Highlights     Nuclear stress EF: 47%.  Blood pressure demonstrated a normal response to exercise.  Upsloping ST segment depression ST segment depression of 1 mm was noted during stress in the II, III, aVF, V5 and V6 leads.  This is a low risk study.  The left ventricular ejection fraction is mildly decreased (45-54%).   Normal resting and stress perfusion. No ischemia or infarction EF 47% with evidence of Mild LVE and diffuse hypokinesis Baseline ECG with voltage for LVH and upsloping ST depression 1 mm with exercise. Suggest correlation of EF with echo or MRI     Study Conclusions   - Left ventricle: The cavity size was normal. Systolic function was   normal. The estimated ejection fraction was in the range of 50%   to 55%. Wall motion was normal; there were no regional wall   motion abnormalities. Left ventricular diastolic function   parameters were normal. Internal dimension, ES (PLAX chordal):   39.2 mm. - Aortic valve: A bicuspid morphology cannot be excluded; normal   thickness leaflets. There was severe regurgitation. - Aorta: Aortic root dimension: 38 mm (ED). - Ascending aorta: The ascending aorta was mildly dilated.   Impressions:   - When compared to prior, aortic regurgitation is now reported.       ASSESSMENT:    1. Severe aortic insufficiency   2. Palpitations      PLAN:  In order of problems listed above:  Severe aortic insufficiency: Dr. Clifton James recommends TEE to further evaluate aortic valve.  [facility] has been requested to perform a transesophageal echocardiogram on Jamaia Brum for.  After careful review of history and examination, the risks and benefits of  transesophageal echocardiogram have been explained including risks of esophageal damage, perforation (1:10,000 risk), bleeding, pharyngeal hematoma as well as other potential complications associated with conscious sedation including aspiration, arrhythmia, respiratory failure and death. Alternatives to treatment were discussed, questions were answered. Patient is willing to proceed.   Palpitations most bothersome at night when she lays down no exertional symptoms  Jacolyn Reedy,  11/06/2015 10:19 AM      Medication Adjustments/Labs and Tests Ordered: Current medicines are reviewed at length with the patient today.  Concerns regarding medicines are outlined above.  Medication changes, Labs and Tests ordered today are listed in the Patient Instructions below. There are no Patient Instructions on file for this visit.   Elson Clan, PA-C  11/06/2015 10:19 AM    [facility] [address] [place], [place], [place]  [address] Phone: [phone]; Fax: [phone]

## 2015-11-07 ENCOUNTER — Ambulatory Visit (HOSPITAL_COMMUNITY)
Admission: RE | Admit: 2015-11-07 | Discharge: 2015-11-07 | Disposition: A | Discharge status: Home / Self Care | Payer: BLUE CROSS/BLUE SHIELD | Source: Ambulatory Visit | Attending: Internal Medicine | Admitting: Internal Medicine

## 2015-11-07 ENCOUNTER — Encounter (HOSPITAL_COMMUNITY): Payer: Self-pay

## 2015-11-07 ENCOUNTER — Ambulatory Visit (HOSPITAL_BASED_OUTPATIENT_CLINIC_OR_DEPARTMENT_OTHER): Payer: BLUE CROSS/BLUE SHIELD

## 2015-11-07 ENCOUNTER — Encounter (HOSPITAL_COMMUNITY)
Admission: RE | Disposition: A | Discharge status: Home / Self Care | Payer: Self-pay | Source: Ambulatory Visit | Attending: Internal Medicine

## 2015-11-07 DIAGNOSIS — I350 Nonrheumatic aortic (valve) stenosis: Secondary | ICD-10-CM | POA: Diagnosis not present

## 2015-11-07 DIAGNOSIS — I351 Nonrheumatic aortic (valve) insufficiency: Secondary | ICD-10-CM | POA: Diagnosis present

## 2015-11-07 DIAGNOSIS — I080 Rheumatic disorders of both mitral and aortic valves: Secondary | ICD-10-CM | POA: Diagnosis not present

## 2015-11-07 DIAGNOSIS — Z7982 Long term (current) use of aspirin: Secondary | ICD-10-CM | POA: Diagnosis not present

## 2015-11-07 DIAGNOSIS — I253 Aneurysm of heart: Secondary | ICD-10-CM | POA: Diagnosis not present

## 2015-11-07 DIAGNOSIS — Z8249 Family history of ischemic heart disease and other diseases of the circulatory system: Secondary | ICD-10-CM | POA: Diagnosis not present

## 2015-11-07 DIAGNOSIS — E781 Pure hyperglyceridemia: Secondary | ICD-10-CM | POA: Diagnosis not present

## 2015-11-07 DIAGNOSIS — Z79899 Other long term (current) drug therapy: Secondary | ICD-10-CM | POA: Diagnosis not present

## 2015-11-07 DIAGNOSIS — Q211 Atrial septal defect: Secondary | ICD-10-CM | POA: Diagnosis not present

## 2015-11-07 HISTORY — PX: TEE WITHOUT CARDIOVERSION: SHX5443

## 2015-11-07 SURGERY — ECHOCARDIOGRAM, TRANSESOPHAGEAL
Anesthesia: Moderate Sedation

## 2015-11-07 MED ORDER — MIDAZOLAM HCL 5 MG/ML IJ SOLN
INTRAMUSCULAR | Status: AC
  Filled 2015-11-07: qty 2

## 2015-11-07 MED ORDER — MIDAZOLAM HCL 10 MG/2ML IJ SOLN
INTRAMUSCULAR | Status: DC | PRN
  Administered 2015-11-07: 2 mg via INTRAVENOUS
  Administered 2015-11-07: 1 mg via INTRAVENOUS
  Administered 2015-11-07 (×2): 2 mg via INTRAVENOUS

## 2015-11-07 MED ORDER — BUTAMBEN-TETRACAINE-BENZOCAINE 2-2-14 % EX AERO
INHALATION_SPRAY | CUTANEOUS | Status: DC | PRN
  Administered 2015-11-07: 2 via TOPICAL

## 2015-11-07 MED ORDER — FENTANYL CITRATE (PF) 100 MCG/2ML IJ SOLN
INTRAMUSCULAR | Status: DC | PRN
  Administered 2015-11-07: 25 ug via INTRAVENOUS
  Administered 2015-11-07: 50 ug via INTRAVENOUS
  Administered 2015-11-07: 25 ug via INTRAVENOUS

## 2015-11-07 MED ORDER — SODIUM CHLORIDE 0.9 % IV SOLN: INTRAVENOUS | Status: DC

## 2015-11-07 MED ORDER — FENTANYL CITRATE (PF) 100 MCG/2ML IJ SOLN
INTRAMUSCULAR | Status: AC
  Filled 2015-11-07: qty 2

## 2015-11-07 NOTE — Discharge Instructions (Signed)

## 2015-11-07 NOTE — H&P (Signed)
     INTERVAL PROCEDURE H&P  History and Physical Interval Note:  11/07/2015 7:49 AM  Marissa Bailey has presented today for their planned procedure. The various methods of treatment have been discussed with the patient and family. After consideration of risks, benefits and other options for treatment, the patient has consented to the procedure.  The patients' outpatient history has been reviewed, patient examined, and no change in status from most recent office note within the past 30 days. I have reviewed the patients' chart and labs and will proceed as planned. Questions were answered to the patient's satisfaction.   Chrystie NoseKenneth C. Levester Waldridge, MD, [facility] Center[facility]ACC Attending Cardiologist [facility]  Chrystie NoseKenneth C Ismael Treptow 11/07/2015, 7:49 AM

## 2015-11-07 NOTE — Progress Notes (Signed)
*  PRELIMINARY RESULTS* Echocardiogram 2D Echocardiogram has been performed.  Marissa Bailey, Marissa Bailey 11/07/2015, 11:19 AM

## 2015-11-07 NOTE — CV Procedure (Signed)
TRANSESOPHAGEAL ECHOCARDIOGRAM (TEE) NOTE  INDICATIONS: aortic insufficiency  PROCEDURE:   Informed consent was obtained prior to the procedure. The risks, benefits and alternatives for the procedure were discussed and the patient comprehended these risks.  Risks include, but are not limited to, cough, sore throat, vomiting, nausea, somnolence, esophageal and stomach trauma or perforation, bleeding, low blood pressure, aspiration, pneumonia, infection, trauma to the teeth and death.    After a procedural time-out, the patient was given 7 mg versed and 100 mcg fentanyl for moderate sedation.  The patient's heart rate, blood pressure, and oxygen saturation are monitored continuously during the procedure.The oropharynx was anesthetized 10 cc of topical 1% viscous lidocaine and 2 cetacaine sprays.  The transesophageal probe was inserted in the esophagus and stomach without difficulty and multiple views were obtained.  The patient was kept under observation until the patient left the procedure room.  The period of conscious sedation is 20 minutes, of which I was present face-to-face 100% of this time. The patient left the procedure room in stable condition.   Agitated microbubble saline contrast was administered.  COMPLICATIONS:    There were no immediate complications.  Findings:  1. LEFT VENTRICLE: The left ventricular wall thickness is normal.  The left ventricular cavity is normal in size. Wall motion is normal.  LVEF is 55-60%.  2. RIGHT VENTRICLE:  The right ventricle is normal in structure and function without any thrombus or masses.    3. LEFT ATRIUM:  The left atrium is normal in size without any thrombus or masses.  There is not spontaneous echo contrast ("smoke") in the left atrium consistent with a low flow state.  4. LEFT ATRIAL APPENDAGE:  The left atrial appendage is free of any thrombus or masses. The appendage has single lobes. Pulse doppler indicates moderate flow in the  appendage.  5. ATRIAL SEPTUM:  The atrial septum has a fixed Type 1R aneurysm (bowing to the right) with a small PFO and left to right color flow by doppler. There is no evidence for right to left shunt by saline microbubble.  6. RIGHT ATRIUM:  The right atrium is normal in size and function without any thrombus or masses.  7. MITRAL VALVE:  The mitral valve is normal in structure and function with Mild regurgitation.  There were no vegetations or stenosis.  8. AORTIC VALVE:  The aortic valve is trileaflet, however, the sinotubular junction is dilated to 4.2 cm, causing Severe central regurgitation.  There were no vegetations or stenosis  9. TRICUSPID VALVE:  The tricuspid valve is normal in structure and function with trivial regurgitation.  There were no vegetations or stenosis  10.  PULMONIC VALVE:  The pulmonic valve is normal in structure and function with no regurgitation.  There were no vegetations or stenosis.   11. AORTIC ARCH, ASCENDING AND DESCENDING AORTA:  There was no Myrtis Ser et. Al, 1992) atherosclerosis of the aortic arch, or proximal descending aorta. The aortic arch and proximal descending aorta were not dilated. The ascending aorta was incompletely visualized.  12. PULMONARY VEINS: Anomalous pulmonary venous return was not noted.  13. PERICARDIUM: The pericardium appeared normal and non-thickened.  There is no pericardial effusion.  IMPRESSION:   1. Likely severe aortic insufficiency, trileaflet valve, secondary to a dilated sinotubular junction to 4.2 cm. 2. Mild mitral regurgitation 3. No LAA thrombus 4. Small PFO with Type 1R atrial septal aneurysm and left to right, but not right to left flow by color doppler and  saline microbubble contrast 5. Normal LVEF of 55-60%, the LV is not dilated.  RECOMMENDATIONS:    1. Severe central AI secondary to dilated sinotubular junction. Wide pulse pressure. Consider evaluation for aortic valve replacement.  Time Spent Directly  with the Patient:  45 minutes   Chrystie NoseKenneth C. Josey Forcier, MD, [facility]ACC Attending Cardiologist [facility]  11/07/2015, 10:29 AM

## 2015-11-08 ENCOUNTER — Encounter (HOSPITAL_COMMUNITY): Payer: Self-pay | Admitting: Internal Medicine

## 2015-11-13 ENCOUNTER — Encounter: Payer: Self-pay | Admitting: Cardiovascular Disease

## 2015-11-14 ENCOUNTER — Ambulatory Visit (INDEPENDENT_AMBULATORY_CARE_PROVIDER_SITE_OTHER): Payer: BLUE CROSS/BLUE SHIELD | Admitting: Cardiovascular Disease

## 2015-11-14 ENCOUNTER — Encounter: Payer: Self-pay | Admitting: *Deleted

## 2015-11-14 ENCOUNTER — Other Ambulatory Visit: Payer: Self-pay | Admitting: *Deleted

## 2015-11-14 ENCOUNTER — Encounter: Payer: Self-pay | Admitting: Cardiovascular Disease

## 2015-11-14 ENCOUNTER — Telehealth: Payer: Self-pay | Admitting: *Deleted

## 2015-11-14 VITALS — BP 150/40 | HR 74 | Ht 63.0 in | Wt 152.0 lb

## 2015-11-14 DIAGNOSIS — I351 Nonrheumatic aortic (valve) insufficiency: Secondary | ICD-10-CM

## 2015-11-14 LAB — CBC
HCT: 37.6 % (ref 35.0–45.0)
Hemoglobin: 12.7 g/dL (ref 11.7–15.5)
MCH: 30.3 pg (ref 27.0–33.0)
MCHC: 33.8 g/dL (ref 32.0–36.0)
MCV: 89.7 fL (ref 80.0–100.0)
MPV: 9.6 fL (ref 7.5–12.5)
PLATELETS: 216 10*3/uL (ref 140–400)
RBC: 4.19 MIL/uL (ref 3.80–5.10)
RDW: 13 % (ref 11.0–15.0)
WBC: 4.2 10*3/uL (ref 3.8–10.8)

## 2015-11-14 NOTE — Patient Instructions (Addendum)
Medication Instructions:  Your physician recommends that you continue on your current medications as directed. Please refer to the Current Medication list given to you today.   Labwork: Lab work to be done today--   CBC     Testing/Procedures: Your physician has requested that you have a cardiac catheterization. Cardiac catheterization is used to diagnose and/or treat various heart conditions. Doctors may recommend this procedure for a number of different reasons. The most common reason is to evaluate chest pain. Chest pain can be a symptom of coronary artery disease (CAD), and cardiac catheterization can show whether plaque is narrowing or blocking your heart's arteries. This procedure is also used to evaluate the valves, as well as measure the blood flow and oxygen levels in different parts of your heart. For further information please visit [email or web address]. Please follow instruction sheet, as given. Scheduled for September 14,2017    Follow-Up: To be determined after procedure.       Any Other Special Instructions Will Be Listed Below (If Applicable).     If you need a refill on your cardiac medications before your next appointment, please call your pharmacy.

## 2015-11-14 NOTE — Telephone Encounter (Signed)
Per Dr. Clifton JamesMcAlhany pt needs CTA of chest to evaluate aorta next week. Will need BMP on Monday. I spoke with pt and she will come in for BMP on 11/19/15 and CTA on 11/20/15.  Pt aware to be npo for 2 hours prior to CT and arrive at 8:45.

## 2015-11-14 NOTE — Progress Notes (Addendum)
Chief Complaint  Patient presents with  . aortic insufficiency      History of Present Illness: 55 yo WF with history of mitral regurgitation, SVT, PACs and recent diagnosis of severe aortic valve insufficiency who is here today for follow up. I met her back in 2011 with c/p palpitations. Echo in 2011 showed mild MR with normal LV function. She had a normal stress test in 2013. 48 hour monitor in 2013 with NSR, PACs and several short runs of SVT (15 beats). She was found to have a low TSH at that visit so she was referred to primary care for advice and has had her thyroid replacement therapy altered.  She was started on Cardizem. She was seen in our office August 2017 for routine follow up. Echo 10/29/15 with normal LV function, severe aortic insufficiency. TEE on 96/17 with normal LV function, severe AI, mild MR, PFO.  She is here today for follow up. She has no chest pain. She has been trying to exercise but she is easily dyspneic. No lower extremity edema. Her weight has been stable.   Primary Care Physician: Lottie Dawson, MD   Past Medical History:  Diagnosis Date  . Anxiety   . Heart palpitations   . Migraines    menstrual  . Seasonal allergies     Past Surgical History:  Procedure Laterality Date  . EXPLORATORY LAPAROTOMY     for tubal pregnancy  . TEE WITHOUT CARDIOVERSION N/A 11/07/2015   Procedure: TRANSESOPHAGEAL ECHOCARDIOGRAM (TEE);  Surgeon: Pixie Casino, MD;  Location: [facility] ENDOSCOPY;  Service: Cardiovascular;  Laterality: N/A;  . vein surgery lower extremity      Current Outpatient Prescriptions  Medication Sig Dispense Refill  . aspirin 81 MG tablet Take 81 mg by mouth at bedtime.     . Cholecalciferol (VITAMIN D) 1000 UNITS capsule Take 2,000 Units by mouth daily.     . Echinacea 400 MG CAPS Take 1 capsule by mouth daily.     . fish oil-omega-3 fatty acids 1000 MG capsule Take 2 g by mouth daily.      Marland Kitchen glucosamine-chondroitin 500-400 MG tablet Take 1  tablet by mouth daily.    Marland Kitchen liothyronine (CYTOMEL) 5 MCG tablet Take 15 mcg by mouth daily. [facility]    .Marland Kitchenliotrix (THYROLAR-1) 60 (12.5-50) MG (MCG) TABS tablet Take 1 tablet by mouth daily.    .Marland KitchenMAGNESIUM CARBONATE PO Take 1 tablet by mouth daily.     . Multiple Vitamin (MULTIVITAMIN WITH MINERALS) TABS Take 1 tablet by mouth daily.    . NON FORMULARY Apply 0.5 mLs topically daily. 70/30 mix of estriol and estradiol in a cream base. [facility]    .Marland KitchenPRESCRIPTION MEDICATION Apply 0.1 mLs topically daily. Testosterone 2% cream compounded    . progesterone (PROMETRIUM) 100 MG capsule Take 100 mg by mouth at bedtime.    . vitamin B-12 (CYANOCOBALAMIN) 1000 MCG tablet Take 1,000 mcg by mouth daily.    . vitamin C (ASCORBIC ACID) 500 MG tablet Take 1,000 mg by mouth daily.      No current facility-administered medications for this visit.     No Known Allergies  Social History   Social History  . Marital status: Married    Spouse name: N/A  . Number of children: N/A  . Years of education: N/A   Occupational History  . Not on file.   Social History Main Topics  .  Smoking status: Never Smoker  . Smokeless tobacco: Never Used  . Alcohol use Yes     Comment: socially  . Drug use: No  . Sexual activity: Not on file   Other Topics Concern  . 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 Heart attack Mother 83    brain impairment tobacco dm  . Diabetes Mother   . Hyperlipidemia Mother   . Other Sister     whole in heart age 8  . Cancer Paternal Grandmother     breast  . Stroke Paternal Grandmother     Review of Systems:  As stated in the HPI and otherwise negative.   BP (!) 150/40   Pulse 74   Ht 5\' 3"  (1.6 m)    Wt 152 lb (68.9 kg)   BMI 26.93 kg/m   Physical Examination: General: Well developed, well nourished, NAD  HEENT: OP clear, mucus membranes moist  SKIN: warm, dry. No rashes. Neuro: No focal deficits  Musculoskeletal: Muscle strength 5/5 all ext  Psychiatric: Mood and affect normal  Neck: No JVD, no carotid bruits, no thyromegaly, no lymphadenopathy.  Lungs:Clear bilaterally, no wheezes, rhonci, crackles Cardiovascular: Regular rate and rhythm. Diastolic murmur noted. No gallops or rubs. Abdomen:Soft. Bowel sounds present. Non-tender.  Extremities: No lower extremity edema. Pulses are 2 + in the bilateral DP/PT.  Echo 10/29/15: Left ventricle: The cavity size was normal. Systolic function was   normal. The estimated ejection fraction was in the range of 50%   to 55%. Wall motion was normal; there were no regional wall   motion abnormalities. Left ventricular diastolic function   parameters were normal. Internal dimension, ES (PLAX chordal):   39.2 mm. - Aortic valve: A bicuspid morphology cannot be excluded; normal   thickness leaflets. There was severe regurgitation. - Aorta: Aortic root dimension: 38 mm (ED). - Ascending aorta: The ascending aorta was mildly dilated.   TEE 11/07/15: Left ventricle: The cavity size was normal. Wall thickness was   normal. Systolic function was normal. The estimated ejection   fraction was in the range of 55% to 60%. Wall motion was normal;   there were no regional wall motion abnormalities. - Aortic valve: Dilated sinotubular junction to 4.2 cm. Trileaflet   valve. Severe regurgitation. Vena contracta measures 1.1 cm.   Regurgitation pressure half-time: 253 ms. - Mitral valve: There was mild regurgitation. - Left atrium: No evidence of thrombus in the atrial cavity or   appendage. - Right atrium: No evidence of thrombus in the atrial cavity or   appendage. - Atrial septum: There was a patent foramen ovale. Left to right,   but not right to  left shunting. There is a type 1R atrial septal   aneurysm. - Pulmonic valve: No evidence of vegetation.  Impressions:  - Likely severe aortic stenosis. The preferred modality to evaluate   regurgitant volume is PISA analysis by transthoracic   echocardiogram. That being said, this TEE study does demonstrate   a dilated sinotubular junction with a normal trileaflet   structure. Evaluation for aortic valve surgery may be indicated.  EKG:  EKG is not ordered today. The ekg ordered today demonstrates   Recent Labs: 11/06/2015: BUN 15; Creat  0.92; Potassium 4.0; Sodium 137   Lipid Panel    Component Value Date/Time   CHOL 177 09/18/2009 1621   TRIG 109.0 09/18/2009 1621   HDL 76.50 09/18/2009 1621   CHOLHDL 2 09/18/2009 1621   VLDL 21.8 09/18/2009 1621   LDLCALC 79 09/18/2009 1621     Wt Readings from Last 3 Encounters:  11/14/15 152 lb (68.9 kg)  11/07/15 153 lb (69.4 kg)  11/06/15 153 lb 6.4 oz (69.6 kg)     Other studies Reviewed: Additional studies/ records that were reviewed today include: . Review of the above records demonstrates:   Assessment and Plan:   1. Severe aortic valve insufficiency: She has normal LV systolic function with severe AI by echo and confirmed by TEE. The aortic root is mildly dilated. She will need referral to CT surgery for consideration for AVR. Will arrange right and left heart catheterization at [facility] tomorrow. CBC today. BMET and INR ok last week. Risks and benefits of the procedure are reviewed with the patient. She may need CTA to look at her aorta. Will discuss with DR. Bartle.    Current medicines are reviewed at length with the patient today.  The patient does not have concerns regarding medicines.  The following changes have been made:  no change  Labs/ tests ordered today include:  No orders of the defined types were placed in this encounter.    Disposition:   FU with me after the procedure.    Signed, Lauree Chandler,  MD 11/14/2015 8:39 AM    [address], [place], [place]  [address] Phone: [phone]; Fax: [phone]

## 2015-11-15 ENCOUNTER — Ambulatory Visit (HOSPITAL_COMMUNITY)
Admission: RE | Admit: 2015-11-15 | Discharge: 2015-11-15 | Disposition: A | Discharge status: Home / Self Care | Payer: BLUE CROSS/BLUE SHIELD | Source: Ambulatory Visit | Attending: Cardiovascular Disease | Admitting: Cardiovascular Disease

## 2015-11-15 ENCOUNTER — Encounter (HOSPITAL_COMMUNITY)
Admission: RE | Disposition: A | Discharge status: Home / Self Care | Payer: Self-pay | Source: Ambulatory Visit | Attending: Cardiovascular Disease

## 2015-11-15 ENCOUNTER — Encounter (HOSPITAL_COMMUNITY): Payer: Self-pay | Admitting: *Deleted

## 2015-11-15 DIAGNOSIS — Z803 Family history of malignant neoplasm of breast: Secondary | ICD-10-CM | POA: Insufficient documentation

## 2015-11-15 DIAGNOSIS — Z823 Family history of stroke: Secondary | ICD-10-CM | POA: Insufficient documentation

## 2015-11-15 DIAGNOSIS — Z8249 Family history of ischemic heart disease and other diseases of the circulatory system: Secondary | ICD-10-CM | POA: Diagnosis not present

## 2015-11-15 DIAGNOSIS — Z833 Family history of diabetes mellitus: Secondary | ICD-10-CM | POA: Insufficient documentation

## 2015-11-15 DIAGNOSIS — I471 Supraventricular tachycardia: Secondary | ICD-10-CM | POA: Diagnosis not present

## 2015-11-15 DIAGNOSIS — F419 Anxiety disorder, unspecified: Secondary | ICD-10-CM | POA: Diagnosis not present

## 2015-11-15 DIAGNOSIS — G43909 Migraine, unspecified, not intractable, without status migrainosus: Secondary | ICD-10-CM | POA: Diagnosis not present

## 2015-11-15 DIAGNOSIS — I080 Rheumatic disorders of both mitral and aortic valves: Secondary | ICD-10-CM | POA: Diagnosis not present

## 2015-11-15 DIAGNOSIS — Z7982 Long term (current) use of aspirin: Secondary | ICD-10-CM | POA: Insufficient documentation

## 2015-11-15 DIAGNOSIS — I351 Nonrheumatic aortic (valve) insufficiency: Secondary | ICD-10-CM | POA: Diagnosis not present

## 2015-11-15 HISTORY — PX: CARDIAC CATHETERIZATION: SHX172

## 2015-11-15 LAB — POCT I-STAT 3, ART BLOOD GAS (G3+)
Acid-base deficit: 3 mmol/L — ABNORMAL HIGH (ref 0.0–2.0)
BICARBONATE: 23.2 mmol/L (ref 20.0–28.0)
O2 Saturation: 97 %
PCO2 ART: 47.4 mmHg (ref 32.0–48.0)
PO2 ART: 105 mmHg (ref 83.0–108.0)
TCO2: 25 mmol/L (ref 0–100)
pH, Arterial: 7.298 — ABNORMAL LOW (ref 7.350–7.450)

## 2015-11-15 LAB — POCT I-STAT 3, VENOUS BLOOD GAS (G3P V)
Acid-base deficit: 1 mmol/L (ref 0.0–2.0)
Bicarbonate: 25.1 mmol/L (ref 20.0–28.0)
O2 Saturation: 70 %
PCO2 VEN: 48.5 mmHg (ref 44.0–60.0)
PH VEN: 7.321 (ref 7.250–7.430)
TCO2: 27 mmol/L (ref 0–100)
pO2, Ven: 40 mmHg (ref 32.0–45.0)

## 2015-11-15 SURGERY — RIGHT/LEFT HEART CATH AND CORONARY ANGIOGRAPHY

## 2015-11-15 MED ORDER — FENTANYL CITRATE (PF) 100 MCG/2ML IJ SOLN
INTRAMUSCULAR | Status: DC | PRN
  Administered 2015-11-15: 50 ug via INTRAVENOUS
  Administered 2015-11-15: 25 ug via INTRAVENOUS

## 2015-11-15 MED ORDER — FENTANYL CITRATE (PF) 100 MCG/2ML IJ SOLN
INTRAMUSCULAR | Status: AC
  Filled 2015-11-15: qty 2

## 2015-11-15 MED ORDER — HEPARIN SODIUM (PORCINE) 1000 UNIT/ML IJ SOLN
INTRAMUSCULAR | Status: DC | PRN
  Administered 2015-11-15: 4000 [IU] via INTRAVENOUS

## 2015-11-15 MED ORDER — MIDAZOLAM HCL 2 MG/2ML IJ SOLN
INTRAMUSCULAR | Status: DC | PRN
  Administered 2015-11-15: 2 mg via INTRAVENOUS
  Administered 2015-11-15: 1 mg via INTRAVENOUS

## 2015-11-15 MED ORDER — IOPAMIDOL (ISOVUE-370) INJECTION 76%
INTRAVENOUS | Status: DC | PRN
  Administered 2015-11-15: 35 mL via INTRA_ARTERIAL

## 2015-11-15 MED ORDER — IOPAMIDOL (ISOVUE-370) INJECTION 76%
INTRAVENOUS | Status: AC
  Filled 2015-11-15: qty 100

## 2015-11-15 MED ORDER — VERAPAMIL HCL 2.5 MG/ML IV SOLN
INTRAVENOUS | Status: DC | PRN
  Administered 2015-11-15: 10 mL via INTRA_ARTERIAL

## 2015-11-15 MED ORDER — HEPARIN (PORCINE) IN NACL 2-0.9 UNIT/ML-% IJ SOLN
INTRAMUSCULAR | Status: DC | PRN
  Administered 2015-11-15: 1000 mL

## 2015-11-15 MED ORDER — SODIUM CHLORIDE 0.9% FLUSH: 3.0000 mL | INTRAVENOUS | Status: DC | PRN

## 2015-11-15 MED ORDER — LIDOCAINE HCL (PF) 1 % IJ SOLN
INTRAMUSCULAR | Status: DC | PRN
  Administered 2015-11-15 (×2): 2 mL via SUBCUTANEOUS

## 2015-11-15 MED ORDER — HEPARIN SODIUM (PORCINE) 1000 UNIT/ML IJ SOLN
INTRAMUSCULAR | Status: AC
  Filled 2015-11-15: qty 1

## 2015-11-15 MED ORDER — SODIUM CHLORIDE 0.9 % IV SOLN: 250.0000 mL | INTRAVENOUS | Status: DC | PRN

## 2015-11-15 MED ORDER — VERAPAMIL HCL 2.5 MG/ML IV SOLN
INTRAVENOUS | Status: AC
  Filled 2015-11-15: qty 2

## 2015-11-15 MED ORDER — ASPIRIN 81 MG PO CHEW: 81.0000 mg | CHEWABLE_TABLET | ORAL | Status: DC

## 2015-11-15 MED ORDER — SODIUM CHLORIDE 0.9% FLUSH: 3.0000 mL | Freq: Two times a day (BID) | INTRAVENOUS | Status: DC

## 2015-11-15 MED ORDER — SODIUM CHLORIDE 0.9 % IV SOLN
INTRAVENOUS | Status: AC
  Administered 2015-11-15: 10:00:00 via INTRAVENOUS

## 2015-11-15 MED ORDER — HEPARIN (PORCINE) IN NACL 2-0.9 UNIT/ML-% IJ SOLN
INTRAMUSCULAR | Status: AC
  Filled 2015-11-15: qty 1000

## 2015-11-15 MED ORDER — LIDOCAINE HCL (PF) 1 % IJ SOLN
INTRAMUSCULAR | Status: AC
  Filled 2015-11-15: qty 30

## 2015-11-15 MED ORDER — SODIUM CHLORIDE 0.9 % IV SOLN
INTRAVENOUS | Status: AC
  Administered 2015-11-15: 13:00:00 via INTRAVENOUS

## 2015-11-15 MED ORDER — MIDAZOLAM HCL 2 MG/2ML IJ SOLN
INTRAMUSCULAR | Status: AC
  Filled 2015-11-15: qty 2

## 2015-11-15 SURGICAL SUPPLY — 12 items
CATH BALLN WEDGE 5F 110CM (CATHETERS) ×2 IMPLANT
CATH INFINITI 5 FR JL3.5 (CATHETERS) ×2 IMPLANT
CATH INFINITI JR4 5F (CATHETERS) ×2 IMPLANT
DEVICE RAD COMP TR BAND LRG (VASCULAR PRODUCTS) ×2 IMPLANT
GLIDESHEATH SLEND SS 6F .021 (SHEATH) ×2 IMPLANT
GUIDEWIRE .025 260CM (WIRE) ×2 IMPLANT
KIT HEART LEFT (KITS) ×2 IMPLANT
PACK CARDIAC CATHETERIZATION (CUSTOM PROCEDURE TRAY) ×2 IMPLANT
SHEATH FAST CATH BRACH 5F 5CM (SHEATH) ×2 IMPLANT
TRANSDUCER W/STOPCOCK (MISCELLANEOUS) ×2 IMPLANT
TUBING CIL FLEX 10 FLL-RA (TUBING) ×2 IMPLANT
WIRE HI TORQ VERSACORE-J 145CM (WIRE) ×2 IMPLANT

## 2015-11-15 NOTE — Discharge Instructions (Signed)
Radial Site Care °Refer to this sheet in the next few weeks. These instructions provide you with information about caring for yourself after your procedure. Your health care provider may also give you more specific instructions. Your treatment has been planned according to current medical practices, but problems sometimes occur. Call your health care provider if you have any problems or questions after your procedure. °WHAT TO EXPECT AFTER THE PROCEDURE °After your procedure, it is typical to have the following: °· Bruising at the radial site that usually fades within 1-2 weeks. °· Blood collecting in the tissue (hematoma) that may be painful to the touch. It should usually decrease in size and tenderness within 1-2 weeks. °HOME CARE INSTRUCTIONS °· Take medicines only as directed by your health care provider. °· You may shower 24-48 hours after the procedure or as directed by your health care provider. Remove the bandage (dressing) and gently wash the site with plain soap and water. Pat the area dry with a clean towel. Do not rub the site, because this may cause bleeding. °· Do not take baths, swim, or use a hot tub until your health care provider approves. °· Check your insertion site every day for redness, swelling, or drainage. °· Do not apply powder or lotion to the site. °· Do not flex or bend the affected arm for 24 hours or as directed by your health care provider. °· Do not push or pull heavy objects with the affected arm for 24 hours or as directed by your health care provider. °· Do not lift over 10 lb (4.5 kg) for 5 days after your procedure or as directed by your health care provider. °· Ask your health care provider when it is okay to: °¨ Return to work or school. °¨ Resume usual physical activities or sports. °¨ Resume sexual activity. °· Do not drive home if you are discharged the same day as the procedure. Have someone else drive you. °· You may drive 24 hours after the procedure unless otherwise  instructed by your health care provider. °· Do not operate machinery or power tools for 24 hours after the procedure. °· If your procedure was done as an outpatient procedure, which means that you went home the same day as your procedure, a responsible adult should be with you for the first 24 hours after you arrive home. °· Keep all follow-up visits as directed by your health care provider. This is important. °SEEK MEDICAL CARE IF: °· You have a fever. °· You have chills. °· You have increased bleeding from the radial site. Hold pressure on the site. °SEEK IMMEDIATE MEDICAL CARE IF: °· You have unusual pain at the radial site. °· You have redness, warmth, or swelling at the radial site. °· You have drainage (other than a small amount of blood on the dressing) from the radial site. °· The radial site is bleeding, and the bleeding does not stop after 30 minutes of holding steady pressure on the site. °· Your arm or hand becomes pale, cool, tingly, or numb. °  °This information is not intended to replace advice given to you by your health care provider. Make sure you discuss any questions you have with your health care provider. °  °Document Released: 03/22/2010 Document Revised: 03/10/2014 Document Reviewed: 09/05/2013 °Elsevier Interactive Patient Education ©2016 [employer] ° °

## 2015-11-15 NOTE — H&P (View-Only) (Signed)
Chief Complaint  Patient presents with  . aortic insufficiency      History of Present Illness: 55 yo WF with history of mitral regurgitation, SVT, PACs and recent diagnosis of severe aortic valve insufficiency who is here today for follow up. I met her back in 2011 with c/p palpitations. Echo in 2011 showed mild MR with normal LV function. She had a normal stress test in 2013. 48 hour monitor in 2013 with NSR, PACs and several short runs of SVT (15 beats). She was found to have a low TSH at that visit so she was referred to primary care for advice and has had her thyroid replacement therapy altered.  She was started on Cardizem. She was seen in our office August 2017 for routine follow up. Echo 10/29/15 with normal LV function, severe aortic insufficiency. TEE on 96/17 with normal LV function, severe AI, mild MR, PFO.  She is here today for follow up. She has no chest pain. She has been trying to exercise but she is easily dyspneic. No lower extremity edema. Her weight has been stable.   Primary Care Physician: Lottie Dawson, MD   Past Medical History:  Diagnosis Date  . Anxiety   . Heart palpitations   . Migraines    menstrual  . Seasonal allergies     Past Surgical History:  Procedure Laterality Date  . EXPLORATORY LAPAROTOMY     for tubal pregnancy  . TEE WITHOUT CARDIOVERSION N/A 11/07/2015   Procedure: TRANSESOPHAGEAL ECHOCARDIOGRAM (TEE);  Surgeon: Pixie Casino, MD;  Location: [facility] ENDOSCOPY;  Service: Cardiovascular;  Laterality: N/A;  . vein surgery lower extremity      Current Outpatient Prescriptions  Medication Sig Dispense Refill  . aspirin 81 MG tablet Take 81 mg by mouth at bedtime.     . Cholecalciferol (VITAMIN D) 1000 UNITS capsule Take 2,000 Units by mouth daily.     . Echinacea 400 MG CAPS Take 1 capsule by mouth daily.     . fish oil-omega-3 fatty acids 1000 MG capsule Take 2 g by mouth daily.      Marland Kitchen glucosamine-chondroitin 500-400 MG tablet Take 1  tablet by mouth daily.    Marland Kitchen liothyronine (CYTOMEL) 5 MCG tablet Take 15 mcg by mouth daily. Gets 54mg capsules compounded from [facility]    .Marland Kitchenliotrix (THYROLAR-1) 60 (12.5-50) MG (MCG) TABS tablet Take 1 tablet by mouth daily.    .Marland KitchenMAGNESIUM CARBONATE PO Take 1 tablet by mouth daily.     . Multiple Vitamin (MULTIVITAMIN WITH MINERALS) TABS Take 1 tablet by mouth daily.    . NON FORMULARY Apply 0.5 mLs topically daily. 70/30 mix of estriol and estradiol in a cream base. [facility] by [facility]    .Marland KitchenPRESCRIPTION MEDICATION Apply 0.1 mLs topically daily. Testosterone 2% cream compounded    . progesterone (PROMETRIUM) 100 MG capsule Take 100 mg by mouth at bedtime.    . vitamin B-12 (CYANOCOBALAMIN) 1000 MCG tablet Take 1,000 mcg by mouth daily.    . vitamin C (ASCORBIC ACID) 500 MG tablet Take 1,000 mg by mouth daily.      No current facility-administered medications for this visit.     No Known Allergies  Social History   Social History  . Marital status: Married    Spouse name: N/A  . Number of children: N/A  . Years of education: N/A   Occupational History  . Not on file.   Social History Main Topics  .  Smoking status: Never Smoker  . Smokeless tobacco: Never Used  . Alcohol use Yes     Comment: socially  . Drug use: No  . Sexual activity: Not on file   Other Topics Concern  . 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 Heart attack Mother 65    brain impairment tobacco dm  . Diabetes Mother   . Hyperlipidemia Mother   . Other Sister     whole in heart age 58  . Cancer Paternal Grandmother     breast  . Stroke Paternal Grandmother     Review of Systems:  As stated in the HPI and otherwise negative.   BP (!) 150/40   Pulse 74   Ht 5\' 3"  (1.6 m)    Wt 152 lb (68.9 kg)   BMI 26.93 kg/m   Physical Examination: General: Well developed, well nourished, NAD  HEENT: OP clear, mucus membranes moist  SKIN: warm, dry. No rashes. Neuro: No focal deficits  Musculoskeletal: Muscle strength 5/5 all ext  Psychiatric: Mood and affect normal  Neck: No JVD, no carotid bruits, no thyromegaly, no lymphadenopathy.  Lungs:Clear bilaterally, no wheezes, rhonci, crackles Cardiovascular: Regular rate and rhythm. Diastolic murmur noted. No gallops or rubs. Abdomen:Soft. Bowel sounds present. Non-tender.  Extremities: No lower extremity edema. Pulses are 2 + in the bilateral DP/PT.  Echo 10/29/15: Left ventricle: The cavity size was normal. Systolic function was   normal. The estimated ejection fraction was in the range of 50%   to 55%. Wall motion was normal; there were no regional wall   motion abnormalities. Left ventricular diastolic function   parameters were normal. Internal dimension, ES (PLAX chordal):   39.2 mm. - Aortic valve: A bicuspid morphology cannot be excluded; normal   thickness leaflets. There was severe regurgitation. - Aorta: Aortic root dimension: 38 mm (ED). - Ascending aorta: The ascending aorta was mildly dilated.   TEE 11/07/15: Left ventricle: The cavity size was normal. Wall thickness was   normal. Systolic function was normal. The estimated ejection   fraction was in the range of 55% to 60%. Wall motion was normal;   there were no regional wall motion abnormalities. - Aortic valve: Dilated sinotubular junction to 4.2 cm. Trileaflet   valve. Severe regurgitation. Vena contracta measures 1.1 cm.   Regurgitation pressure half-time: 253 ms. - Mitral valve: There was mild regurgitation. - Left atrium: No evidence of thrombus in the atrial cavity or   appendage. - Right atrium: No evidence of thrombus in the atrial cavity or   appendage. - Atrial septum: There was a patent foramen ovale. Left to right,   but not right to  left shunting. There is a type 1R atrial septal   aneurysm. - Pulmonic valve: No evidence of vegetation.  Impressions:  - Likely severe aortic stenosis. The preferred modality to evaluate   regurgitant volume is PISA analysis by transthoracic   echocardiogram. That being said, this TEE study does demonstrate   a dilated sinotubular junction with a normal trileaflet   structure. Evaluation for aortic valve surgery may be indicated.  EKG:  EKG is not ordered today. The ekg ordered today demonstrates   Recent Labs: 11/06/2015: BUN 15; Creat  0.92; Potassium 4.0; Sodium 137   Lipid Panel    Component Value Date/Time   CHOL 177 09/18/2009 1621   TRIG 109.0 09/18/2009 1621   HDL 76.50 09/18/2009 1621   CHOLHDL 2 09/18/2009 1621   VLDL 21.8 09/18/2009 1621   LDLCALC 79 09/18/2009 1621     Wt Readings from Last 3 Encounters:  11/14/15 152 lb (68.9 kg)  11/07/15 153 lb (69.4 kg)  11/06/15 153 lb 6.4 oz (69.6 kg)     Other studies Reviewed: Additional studies/ records that were reviewed today include: . Review of the above records demonstrates:   Assessment and Plan:   1. Severe aortic valve insufficiency: She has normal LV systolic function with severe AI by echo and confirmed by TEE. The aortic root is mildly dilated. She will need referral to CT surgery for consideration for AVR. Will arrange right and left heart catheterization at [facility] tomorrow. CBC today. BMET and INR ok last week. Risks and benefits of the procedure are reviewed with the patient. She may need CTA to look at her aorta. Will discuss with DR. Bartle.    Current medicines are reviewed at length with the patient today.  The patient does not have concerns regarding medicines.  The following changes have been made:  no change  Labs/ tests ordered today include:  No orders of the defined types were placed in this encounter.    Disposition:   FU with me after the procedure.    Signed, Lauree Chandler,  MD 11/14/2015 8:39 AM    [address], [place], [place]  [address] Phone: [phone]; Fax: [phone]

## 2015-11-15 NOTE — Interval H&P Note (Signed)
History and Physical Interval Note:  11/15/2015 8:58 AM  Marissa Bailey  has presented today for cardiac cath with the diagnosis of aortic insufficiency  The various methods of treatment have been discussed with the patient and family. After consideration of risks, benefits and other options for treatment, the patient has consented to  Procedure(s): Right/Left Heart Cath and Coronary Angiography (N/A) as a surgical intervention .  The patient's history has been reviewed, patient examined, no change in status, stable for surgery.  I have reviewed the patient's chart and labs.  Questions were answered to the patient's satisfaction.    No PCI will be performed as she is a pre-op cath for valve surgery   Verne Carrowhristopher Essie Gehret

## 2015-11-15 NOTE — Progress Notes (Signed)
Pt ambulated to BR, standby assist, without difficulty. Denied dizziness, shortness of breath and nausea. No new bleeding from right radial area noted.

## 2015-11-16 ENCOUNTER — Encounter (HOSPITAL_COMMUNITY): Payer: Self-pay | Admitting: Cardiovascular Disease

## 2015-11-19 ENCOUNTER — Other Ambulatory Visit: Payer: BLUE CROSS/BLUE SHIELD | Admitting: *Deleted

## 2015-11-19 DIAGNOSIS — I351 Nonrheumatic aortic (valve) insufficiency: Secondary | ICD-10-CM | POA: Diagnosis not present

## 2015-11-19 LAB — BASIC METABOLIC PANEL
BUN: 16 mg/dL (ref 7–25)
CHLORIDE: 103 mmol/L (ref 98–110)
CO2: 26 mmol/L (ref 20–31)
Calcium: 10 mg/dL (ref 8.6–10.4)
Creat: 0.92 mg/dL (ref 0.50–1.05)
Glucose, Bld: 94 mg/dL (ref 65–99)
Potassium: 4.6 mmol/L (ref 3.5–5.3)
SODIUM: 141 mmol/L (ref 135–146)

## 2015-11-20 ENCOUNTER — Ambulatory Visit (INDEPENDENT_AMBULATORY_CARE_PROVIDER_SITE_OTHER)
Admission: RE | Admit: 2015-11-20 | Discharge: 2015-11-20 | Disposition: A | Discharge status: Home / Self Care | Payer: BLUE CROSS/BLUE SHIELD | Source: Ambulatory Visit | Attending: Cardiovascular Disease | Admitting: Cardiovascular Disease

## 2015-11-20 DIAGNOSIS — I351 Nonrheumatic aortic (valve) insufficiency: Secondary | ICD-10-CM | POA: Diagnosis not present

## 2015-11-20 MED ORDER — IOPAMIDOL (ISOVUE-370) INJECTION 76%
100.0000 mL | Freq: Once | INTRAVENOUS | Status: AC | PRN
  Administered 2015-11-20: 100 mL via INTRAVENOUS

## 2015-11-21 ENCOUNTER — Ambulatory Visit: Payer: BLUE CROSS/BLUE SHIELD | Admitting: Physician Assistant

## 2015-11-22 ENCOUNTER — Encounter: Payer: BLUE CROSS/BLUE SHIELD | Admitting: Surgery

## 2015-11-23 ENCOUNTER — Institutional Professional Consult (permissible substitution) (INDEPENDENT_AMBULATORY_CARE_PROVIDER_SITE_OTHER): Payer: BLUE CROSS/BLUE SHIELD | Admitting: Surgery

## 2015-11-23 ENCOUNTER — Other Ambulatory Visit: Payer: Self-pay | Admitting: *Deleted

## 2015-11-23 ENCOUNTER — Encounter: Payer: Self-pay | Admitting: Surgery

## 2015-11-23 VITALS — BP 145/50 | HR 71 | Resp 16 | Ht 63.0 in | Wt 152.0 lb

## 2015-11-23 DIAGNOSIS — I351 Nonrheumatic aortic (valve) insufficiency: Secondary | ICD-10-CM

## 2015-11-23 NOTE — Progress Notes (Signed)
Cardiothoracic Surgery Consultation  PCP is Lorretta Harp, MD Referring Provider is Kathleene Hazel*  Chief Complaint  Patient presents with  . Aortic Insuffiency    TEE 11/07/15, CATH 11/15/15, CTA CHEST 11/20/15    HPI:  The patient is a 55 year old woman in generally good health with a history of mild mitral regurgitation but no AI by echo in 2011 when she was having SVT and PAC's. She was noted to have a new heart murmur recently on routine followup and a 2D echo on 10/29/2015 showed severe AI with normal LV function and mild MR. A TEE on 9/6 showed a trileaflet aortic valve with severe AI, mild MR, a small PFO and normal LV function. She reports a several month history of fatigue. She has been exercising but gets short of breath quickly. She denies chest pain or pressure and has no peripheral edema.  Past Medical History:  Diagnosis Date  . Anxiety   . Heart palpitations   . Migraines    menstrual  . Seasonal allergies     Past Surgical History:  Procedure Laterality Date  . CARDIAC CATHETERIZATION N/A 11/15/2015   Procedure: Right/Left Heart Cath and Coronary Angiography;  Surgeon: Kathleene Hazel, MD;  Location: [facility] INVASIVE CV LAB;  Service: Cardiovascular;  Laterality: N/A;  . EXPLORATORY LAPAROTOMY     for tubal pregnancy  . TEE WITHOUT CARDIOVERSION N/A 11/07/2015   Procedure: TRANSESOPHAGEAL ECHOCARDIOGRAM (TEE);  Surgeon: Chrystie Nose, MD;  Location: [facility] ENDOSCOPY;  Service: Cardiovascular;  Laterality: N/A;  . vein surgery lower extremity      Family History  Problem Relation Age of Onset  . Heart attack Mother 62    brain impairment tobacco dm  . Diabetes Mother   . Hyperlipidemia Mother   . Other Sister     whole in heart age 72  . Cancer Paternal Grandmother     breast  . Stroke Paternal Grandmother   brother had cardiac arrest in his 55's last year and was resuscitated. Told that it was a primary arrhythmia.   No family history of  aneurysm disease.  Social History Social History  Substance Use Topics  . Smoking status: Never Smoker  . Smokeless tobacco: Never Used  . Alcohol use Yes     Comment: socially    Current Outpatient Prescriptions  Medication Sig Dispense Refill  . aspirin 81 MG tablet Take 81 mg by mouth at bedtime.     . Cholecalciferol (VITAMIN D) 1000 UNITS capsule Take 2,000 Units by mouth daily.     . Echinacea 400 MG CAPS Take 1 capsule by mouth daily.     . fish oil-omega-3 fatty acids 1000 MG capsule Take 2 g by mouth daily.      Marland Kitchen glucosamine-chondroitin 500-400 MG tablet Take 1 tablet by mouth daily.    Marland Kitchen MAGNESIUM CARBONATE PO Take 1 tablet by mouth daily.     . Multiple Vitamin (MULTIVITAMIN WITH MINERALS) TABS Take 1 tablet by mouth daily.    . NON FORMULARY Apply 0.5 mLs topically daily. 70/30 mix of estriol and estradiol in a cream base. Compounded by [facility].    Marland Kitchen PRESCRIPTION MEDICATION Apply 0.1 mLs topically daily. Testosterone 2% cream compounded    . progesterone (PROMETRIUM) 100 MG capsule Take 100 mg by mouth at bedtime.    . vitamin B-12 (CYANOCOBALAMIN) 1000 MCG tablet Take 1,000 mcg by mouth daily.    . vitamin C (ASCORBIC ACID) 500 MG  tablet Take 1,000 mg by mouth daily.     Marland Kitchen liotrix (THYROLAR-1) 60 (12.5-50) MG (MCG) TABS tablet Take 1 tablet by mouth daily.     No current facility-administered medications for this visit.     No Known Allergies  Review of Systems  Constitutional: Positive for activity change and fatigue.  HENT: Negative.        Saw her dentist in 10/2015  Eyes: Negative.   Respiratory: Negative.   Cardiovascular: Positive for palpitations. Negative for chest pain and leg swelling.  Gastrointestinal: Negative.   Endocrine: Negative.   Genitourinary: Negative.   Musculoskeletal: Positive for myalgias.  Skin: Negative.   Allergic/Immunologic: Negative.   Neurological: Negative.   Hematological: Negative.   Psychiatric/Behavioral:  Negative.     BP (!) 145/50   Pulse 71   Resp 16   Ht 5\' 3"  (1.6 m)   Wt 152 lb (68.9 kg)   SpO2 98% Comment: ON RA  BMI 26.93 kg/m  Physical Exam  Constitutional: She is oriented to person, place, and time. She appears well-developed and well-nourished. No distress.  HENT:  Head: Normocephalic and atraumatic.  Mouth/Throat: Oropharynx is clear and moist.  Eyes: EOM are normal. Pupils are equal, round, and reactive to light.  Neck: Normal range of motion. Neck supple. No JVD present. No tracheal deviation present. No thyromegaly present.  Cardiovascular: Normal rate, regular rhythm and intact distal pulses.   Murmur heard. 2/6 systolic murmur and 3/6 diastolic murmur heard throughout precordium.  Pulmonary/Chest: Effort normal and breath sounds normal. No respiratory distress. She has no rales.  Abdominal: Soft. Bowel sounds are normal. She exhibits no distension and no mass. There is no tenderness.  Musculoskeletal: Normal range of motion. She exhibits no edema.  Lymphadenopathy:    She has no cervical adenopathy.  Neurological: She is alert and oriented to person, place, and time. No cranial nerve deficit or sensory deficit.  Skin: Skin is warm and dry.  Psychiatric: She has a normal mood and affect.     Diagnostic Tests:  Result status: Final result                              *[place]*                   *[facility]*                         [address]                        [place], [place] [address]                            [phone]  ------------------------------------------------------------------- Transesophageal Echocardiography  Patient:    Tyleigh, Mahn MR #:       [id number] Study Date: 11/07/2015 Gender:     F Age:        54 Height:     160 cm Weight:     69.4 kg BSA:        1.77 m^2 Pt. Status: Room:   Rondall Allegra  REFERRING    Jacolyn Reedy M  ADMITTING    Zoila Shutter MD  ATTENDING     Zoila Shutter MD  PERFORMING   Zoila Shutter MD  SONOGRAPHER  Jeryl Columbia  cc:  ------------------------------------------------------------------- LV EF: 55% -   60%  ------------------------------------------------------------------- Indications:      Aortic insufficiency 424.1.  ------------------------------------------------------------------- History:   PMH:  Aortic Insufficiency. Palpitations.  ------------------------------------------------------------------- Study Conclusions  - Left ventricle: The cavity size was normal. Wall thickness was   normal. Systolic function was normal. The estimated ejection   fraction was in the range of 55% to 60%. Wall motion was normal;   there were no regional wall motion abnormalities. - Aortic valve: Dilated sinotubular junction to 4.2 cm. Trileaflet   valve. Severe regurgitation. Vena contracta measures 1.1 cm.   Regurgitation pressure half-time: 253 ms. - Mitral valve: There was mild regurgitation. - Left atrium: No evidence of thrombus in the atrial cavity or   appendage. - Right atrium: No evidence of thrombus in the atrial cavity or   appendage. - Atrial septum: There was a patent foramen ovale. Left to right,   but not right to left shunting. There is a type 1R atrial septal   aneurysm. - Pulmonic valve: No evidence of vegetation.  Impressions:  - Likely severe aortic stenosis. The preferred modality to evaluate   regurgitant volume is PISA analysis by transthoracic   echocardiogram. That being said, this TEE study does demonstrate   a dilated sinotubular junction with a normal trileaflet   structure. Evaluation for aortic valve surgery may be indicated.  ------------------------------------------------------------------- Study data:   Study status:  Routine.  Consent:  The risks, benefits, and alternatives to the procedure were explained to the patient and informed consent was obtained.  Procedure:   Initial setup. The patient was brought to the laboratory. Surface ECG leads were monitored. Sedation. Conscious sedation was administered. Transesophageal echocardiography. Topical anesthesia was obtained using viscous lidocaine. A transesophageal probe was inserted by the attending cardiologist. Image quality was adequate.  Study completion:  The patient tolerated the procedure well. There were no complications.  Administered medications:   Midazolam, 7mg . Fentanyl, .          Diagnostic transesophageal echocardiography.  2D and color Doppler.  Birthdate:  Patient birthdate: 28-Jul-1960.  Age:  Patient is 55 yr old.  Sex:  Gender: female.    BMI: 27.1 kg/m^2.  Blood pressure:     142/39  Patient status:  Outpatient.  Study date:  Study date: 11/07/2015. Study time: 08:59 AM.  Location:  Endoscopy.  -------------------------------------------------------------------  ------------------------------------------------------------------- Left ventricle:  The cavity size was normal. Wall thickness was normal. Systolic function was normal. The estimated ejection fraction was in the range of 55% to 60%. Wall motion was normal; there were no regional wall motion abnormalities.  ------------------------------------------------------------------- Aortic valve:  Dilated sinotubular junction to 4.2 cm. Trileaflet valve. Severe regurgitation. Vena contracta measures 1.1 cm.   ------------------------------------------------------------------- Mitral valve:   Doppler:  There was mild regurgitation.  ------------------------------------------------------------------- Left atrium:  The atrium was normal in size.  No evidence of thrombus in the atrial cavity or appendage.  ------------------------------------------------------------------- Atrial septum:  There was a patent foramen ovale. Left to right, but not right to left shunting. There is a type 1R atrial  septal aneurysm.  ------------------------------------------------------------------- Pulmonic valve:    Structurally normal valve.   Cusp separation was normal.  No evidence of vegetation.  ------------------------------------------------------------------- Tricuspid valve:   Doppler:  There was trivial regurgitation.  ------------------------------------------------------------------- Pulmonary artery:   The main pulmonary artery was normal-sized.  ------------------------------------------------------------------- Right atrium:  The atrium was normal in size.  No evidence of thrombus in the atrial cavity  or appendage.  ------------------------------------------------------------------- Pericardium:  There was no pericardial effusion.  ------------------------------------------------------------------- Measurements   Aortic valve                          Value  Aortic regurg pressure half-time      253   ms  Legend: (L)  and  (H)  mark values outside specified reference range.  ------------------------------------------------------------------- Prepared and Electronically Authenticated by  Zoila ShutterKenneth Hilty MD 2017-09-06T13:31:49    Physicians   Panel Physicians Referring Physician Case Authorizing Physician  Kathleene Hazelhristopher D McAlhany, MD (Primary)    Procedures   Right/Left Heart Cath and Coronary Angiography  Conclusion   1. No angiographic evidence of CAD 2. Normal filling pressures  Recommendations: Will continue with planning for AVR. Surgical consultation and Chest CTA to size aortic root are being planned.   Indications   Aortic insufficiency [I35.1 (ICD-10-CM)]  Procedural Details/Technique   Technical Details Indication: 55 yo female with severe aortic valve insufficiency. Cardiac cath in preparation for surgical AVR.   Procedure: The risks, benefits, complications, treatment options, and expected outcomes were discussed with the patient. The  patient and/or family concurred with the proposed plan, giving informed consent. The patient was brought to the cath lab after IV hydration was begun and oral premedication was given. The patient was further sedated with Versed and Fentanyl. There was an IV catheter present in the right antecubital area in the brachial vein. I changed this catheter out for a 5 French sheath. A balloon tipped catheter was used to perform a right heart catheterization. The right wrist was prepped and draped in a sterile fashion. 1% lidocaine was used for local anesthesia. Using the modified Seldinger access technique, a 5 French sheath was placed in the right radial artery. 3 mg Verapamil was given through the sheath. 4000 units IV heparin was given. Standard diagnostic catheters were used to perform selective coronary angiography. A pigtail catheter was used to perform a left ventricular angiogram. The sheath was removed from the right radial artery and a Terumo hemostasis band was applied at the arteriotomy site on the right wrist.     Estimated blood loss <50 mL. . During this procedure the patient was administered the following to achieve and maintain moderate conscious sedation: Versed 3 mg, Fentanyl 75 mcg, while the patient's heart rate, blood pressure, and oxygen saturation were continuously monitored. The period of conscious sedation was 24 minutes, of which I was present face-to-face 100% of this time.    Complications   Complications documented before study signed (11/15/2015 12:25 PM EDT)    RIGHT/LEFT HEART CATH AND CORONARY ANGIOGRAPHY   None Documented by Kathleene Hazelhristopher D McAlhany, MD 11/15/2015 12:23 PM EDT  Time Range: Intra-procedure      Coronary Findings   Dominance: Right  Left Anterior Descending  Vessel is large. Vessel is angiographically normal.  First Diagonal Branch  Vessel is moderate in size.  Second Diagonal Branch  Vessel is small in size.  Third Diagonal Branch  Vessel is small in  size.  Left Circumflex  Vessel is large. Vessel is angiographically normal.  Second Obtuse Marginal Branch  Vessel is large in size. Vessel is angiographically normal.  Right Coronary Artery  Vessel is large. Vessel is angiographically normal.  Right Posterior Descending Artery  Vessel is moderate in size.  Right Posterior Atrioventricular Branch  Vessel is moderate in size.  Coronary Diagrams   Diagnostic Diagram     Implants  No implant documentation for this case.  PACS Images   Show images for Cardiac catheterization   Link to Procedure Log   Procedure Log    Hemo Data   Flowsheet Row Most Recent Value  Fick Cardiac Output 4.9 L/min  Fick Cardiac Output Index 2.85 (L/min)/BSA  RA A Wave 9 mmHg  RA V Wave 7 mmHg  RA Mean 5 mmHg  RV Systolic Pressure 31 mmHg  RV Diastolic Pressure 1 mmHg  RV EDP 8 mmHg  PA Systolic Pressure 28 mmHg  PA Diastolic Pressure 11 mmHg  PA Mean 19 mmHg  PW A Wave 15 mmHg  PW V Wave 14 mmHg  PW Mean 10 mmHg  AO Systolic Pressure 136 mmHg  AO Diastolic Pressure 38 mmHg  AO Mean 78 mmHg  LV Systolic Pressure 133 mmHg  LV Diastolic Pressure 7 mmHg  LV EDP 17 mmHg  Arterial Occlusion Pressure Extended Systolic Pressure 129 mmHg  Arterial Occlusion Pressure Extended Diastolic Pressure 36 mmHg  Arterial Occlusion Pressure Extended Mean Pressure 74 mmHg  Left Ventricular Apex Extended Systolic Pressure 132 mmHg  Left Ventricular Apex Extended Diastolic Pressure 6 mmHg  Left Ventricular Apex Extended EDP Pressure 15 mmHg  QP/QS 1  TPVR Index 6.66 HRUI  TSVR Index 27.35 HRUI  PVR SVR Ratio 0.12  TPVR/TSVR Ratio 0.24   CLINICAL DATA:  Aortic insufficiency.  EXAM: CT ANGIOGRAPHY CHEST WITH CONTRAST  TECHNIQUE: Multidetector CT imaging of the chest was performed using the standard protocol during bolus administration of intravenous contrast. Multiplanar CT image reconstructions and MIPs were obtained to evaluate the vascular  anatomy.  CONTRAST:  100 mL of Isovue 370 intravenously.  COMPARISON:  CT scan of November 04, 2011.  FINDINGS: Cardiovascular: Aortic root is dilated at 4.3 cm in diameter. 4.2 cm ascending thoracic aortic aneurysm is noted. Transverse aortic arch measures 2.1 cm in diameter. Proximal portion of descending thoracic aorta measures 3.4 cm consistent with mild dilatation. Distal portion of descending thoracic aorta measures 2.5 cm. Great vessels are widely patent with mild stenosis noted at the origin of the left common carotid artery.  Mediastinum/Nodes: No mediastinal mass or adenopathy is noted.  Lungs/Pleura: No pneumothorax or pleural effusion is noted. No acute pulmonary disease is noted.  Upper Abdomen: No definite abnormality seen.  Musculoskeletal: No significant osseous abnormality is noted.  Review of the MIP images confirms the above findings.  IMPRESSION: Aortic root dilatation is noted at 4.3 cm. Dilatation of proximal portion of descending thoracic aorta is noted at 3.4 cm.  4.2 cm ascending thoracic aortic aneurysm is noted. Recommend annual imaging followup by CTA or MRA. This recommendation follows 2010 ACCF/AHA/AATS/ACR/ASA/SCA/SCAI/SIR/STS/SVM Guidelines for the Diagnosis and Management of Patients with Thoracic Aortic Disease. Circulation. 2010; 121: Z610-R604.   Electronically Signed   By: Lupita Raider, M.D.   On: 11/20/2015 10:47   Impression:  I have personally reviewed and interpreted her 2D echo, TEE, cath films and CTA of the chest. She has a trileaflet aortic valve with severe AI associated with aneurysmal change of the aortic root and ascending aorta. She had no AI by echo in 2011. 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 The aortic root is 4.3 cm and the mid-ascending aorta is 4.2 cm. She had a CTA of the chest in 2013 which is motion degraded but the ascending aorta measures less than 4 cm. Her current CTA shows that the  aorta narrows just proximal to the innominate artery and the arch  is fairly normal sized. There is fusiform aneurysmal change in the proximal descending aorta to about 3.4 cm and the distal thoracic aorta is 2.5 cm. She also had a small PFO on TEE. Cardiac cath shows normal coronary arteries. Her echo shows normal LV function with an EF of 60% and the LV internal dimensions are just at the upper limit of normal. I think replacement of her aortic valve and ascending aorta is indicated to prevent progressive LV deterioration and associated development of congestive heart failure symptoms. This will require a Bentall procedure. I discussed the options for valve replacement including mechanical and tissue valves and the need for lifelong coumadin with a mechanical valve. I discussed the pros and cons of both valve types including the risk of structural valve deterioration with a tissue valve in her age group. She does not have any contraindication to anticoagulation and would like to use a mechanical valve which I think is her best choice at her young age. With annuloaortic ectasia and severe AI I don't think the valve will be suitable for repair. I discussed the operative procedure with the patient and her husband including alternatives, benefits and risks; including but not limited to bleeding, blood transfusion, infection, stroke, myocardial infarction, graft failure, heart block requiring a permanent pacemaker, organ dysfunction, and death. I have answered all of her questions.  Pura Spice understands and agrees to proceed.  We will schedule surgery for Monday 12/03/2015.  Plan:  Bentall procedure using a mechanical valve conduit and closure of patent foramen ovale.   I spent 60 minutes performing this consultation and > 50% of this time was spent face to face counseling and coordinating the care of this patient's severe AI and aortic aneurysm.  Alleen Borne, MD [facility] [phone]

## 2015-11-30 ENCOUNTER — Ambulatory Visit (HOSPITAL_COMMUNITY)
Admission: RE | Admit: 2015-11-30 | Discharge: 2015-11-30 | Disposition: A | Discharge status: Home / Self Care | Payer: BLUE CROSS/BLUE SHIELD | Source: Ambulatory Visit | Attending: Surgery | Admitting: Surgery

## 2015-11-30 ENCOUNTER — Other Ambulatory Visit (HOSPITAL_COMMUNITY): Payer: Self-pay | Admitting: *Deleted

## 2015-11-30 ENCOUNTER — Encounter (HOSPITAL_COMMUNITY): Payer: Self-pay

## 2015-11-30 ENCOUNTER — Encounter (HOSPITAL_COMMUNITY)
Admission: RE | Admit: 2015-11-30 | Discharge: 2015-11-30 | Disposition: A | Discharge status: Home / Self Care | Payer: BLUE CROSS/BLUE SHIELD | Source: Ambulatory Visit | Attending: Surgery | Admitting: Surgery

## 2015-11-30 DIAGNOSIS — I351 Nonrheumatic aortic (valve) insufficiency: Secondary | ICD-10-CM

## 2015-11-30 DIAGNOSIS — I6523 Occlusion and stenosis of bilateral carotid arteries: Secondary | ICD-10-CM | POA: Insufficient documentation

## 2015-11-30 DIAGNOSIS — Z01818 Encounter for other preprocedural examination: Secondary | ICD-10-CM | POA: Diagnosis not present

## 2015-11-30 HISTORY — DX: Atrial septal defect: Q21.1

## 2015-11-30 HISTORY — DX: Anemia, unspecified: D64.9

## 2015-11-30 HISTORY — DX: Patent foramen ovale: Q21.12

## 2015-11-30 LAB — SURGICAL PCR SCREEN
MRSA, PCR: NEGATIVE
Staphylococcus aureus: POSITIVE — AB

## 2015-11-30 LAB — PULMONARY FUNCTION TEST
DL/VA % PRED: 109 %
DL/VA: 5.12 ml/min/mmHg/L
DLCO COR % PRED: 77 %
DLCO COR: 17.66 ml/min/mmHg
DLCO unc % pred: 77 %
DLCO unc: 17.72 ml/min/mmHg
FEF 25-75 POST: 2.15 L/s
FEF 25-75 Pre: 1.35 L/sec
FEF2575-%CHANGE-POST: 59 %
FEF2575-%PRED-POST: 88 %
FEF2575-%PRED-PRE: 55 %
FEV1-%Change-Post: 8 %
FEV1-%Pred-Post: 70 %
FEV1-%Pred-Pre: 65 %
FEV1-Post: 1.8 L
FEV1-Pre: 1.66 L
FEV1FVC-%CHANGE-POST: 8 %
FEV1FVC-%Pred-Pre: 99 %
FEV6-%CHANGE-POST: -1 %
FEV6-%PRED-PRE: 66 %
FEV6-%Pred-Post: 65 %
FEV6-POST: 2.08 L
FEV6-PRE: 2.11 L
FEV6FVC-%PRED-PRE: 103 %
FEV6FVC-%Pred-Post: 103 %
FVC-%Change-Post: 0 %
FVC-%PRED-POST: 64 %
FVC-%Pred-Pre: 64 %
FVC-Post: 2.1 L
FVC-Pre: 2.11 L
Post FEV1/FVC ratio: 86 %
Post FEV6/FVC ratio: 100 %
Pre FEV1/FVC ratio: 79 %
Pre FEV6/FVC Ratio: 100 %
RV % pred: 90 %
RV: 1.69 L
TLC % pred: 79 %
TLC: 3.88 L

## 2015-11-30 LAB — BLOOD GAS, ARTERIAL
ACID-BASE EXCESS: 1.7 mmol/L (ref 0.0–2.0)
BICARBONATE: 25.8 mmol/L (ref 20.0–28.0)
Drawn by: 206361
FIO2: 0.21
O2 SAT: 97.2 %
PCO2 ART: 40.6 mmHg (ref 32.0–48.0)
PH ART: 7.419 (ref 7.350–7.450)
PO2 ART: 91.4 mmHg (ref 83.0–108.0)
Patient temperature: 98.6

## 2015-11-30 LAB — URINALYSIS, ROUTINE W REFLEX MICROSCOPIC
BILIRUBIN URINE: NEGATIVE
GLUCOSE, UA: NEGATIVE mg/dL
HGB URINE DIPSTICK: NEGATIVE
KETONES UR: NEGATIVE mg/dL
Leukocytes, UA: NEGATIVE
Nitrite: NEGATIVE
PROTEIN: NEGATIVE mg/dL
Specific Gravity, Urine: 1.01 (ref 1.005–1.030)
pH: 7 (ref 5.0–8.0)

## 2015-11-30 LAB — CBC
HEMATOCRIT: 39.2 % (ref 36.0–46.0)
Hemoglobin: 13.5 g/dL (ref 12.0–15.0)
MCH: 31 pg (ref 26.0–34.0)
MCHC: 34.4 g/dL (ref 30.0–36.0)
MCV: 90.1 fL (ref 78.0–100.0)
PLATELETS: 218 10*3/uL (ref 150–400)
RBC: 4.35 MIL/uL (ref 3.87–5.11)
RDW: 12.6 % (ref 11.5–15.5)
WBC: 4.8 10*3/uL (ref 4.0–10.5)

## 2015-11-30 LAB — COMPREHENSIVE METABOLIC PANEL
ALT: 23 U/L (ref 14–54)
AST: 32 U/L (ref 15–41)
Albumin: 4.6 g/dL (ref 3.5–5.0)
Alkaline Phosphatase: 57 U/L (ref 38–126)
Anion gap: 9 (ref 5–15)
BILIRUBIN TOTAL: 0.5 mg/dL (ref 0.3–1.2)
BUN: 20 mg/dL (ref 6–20)
CHLORIDE: 110 mmol/L (ref 101–111)
CO2: 21 mmol/L — ABNORMAL LOW (ref 22–32)
CREATININE: 1 mg/dL (ref 0.44–1.00)
Calcium: 9.7 mg/dL (ref 8.9–10.3)
Glucose, Bld: 100 mg/dL — ABNORMAL HIGH (ref 65–99)
POTASSIUM: 4.4 mmol/L (ref 3.5–5.1)
Sodium: 140 mmol/L (ref 135–145)
TOTAL PROTEIN: 7.4 g/dL (ref 6.5–8.1)

## 2015-11-30 LAB — APTT: aPTT: 27 seconds (ref 24–36)

## 2015-11-30 LAB — PROTIME-INR
INR: 0.95
PROTHROMBIN TIME: 12.6 s (ref 11.4–15.2)

## 2015-11-30 MED ORDER — ALBUTEROL SULFATE (2.5 MG/3ML) 0.083% IN NEBU
2.5000 mg | INHALATION_SOLUTION | Freq: Once | RESPIRATORY_TRACT | Status: AC
  Administered 2015-11-30: 2.5 mg via RESPIRATORY_TRACT

## 2015-11-30 NOTE — Progress Notes (Signed)
Pt. States that she needs further explanation of procedure before signing consent.  Dr. Sharee PimpleBartle's office called, spoke with Revonda StandardAllison, she will call pt. At home and talk with her about the procedure and she can sign consent DOS.

## 2015-11-30 NOTE — Progress Notes (Signed)
Pre-op Cardiac Surgery  Carotid Findings:  Bilateral - 1% to 39% ICA stenosis by velocities however plaque formation does not support the increase. Possible cardiac component. Vertebral artery flow is antegrade  Upper Extremity Right Left  Brachial Pressures 157 Triphasic 160 Triphasic  Radial Waveforms Triphasic Triphasic  Ulnar Waveforms Triphasic Triphasic  Palmar Arch (Allen's Test) Normal Normal   Findings:  Palmar arch evaluation - Doppler waveforms remained normal bilaterally with both radial and ulnar compressions.   [employer] ElectricVirginia Lorilei Bailey, RVS 11/30/15 6:45 PM

## 2015-11-30 NOTE — Progress Notes (Signed)
Pt. Notified of results of PCR,Rx. [facility] [address].

## 2015-11-30 NOTE — Pre-Procedure Instructions (Signed)
    Marissa Bailey  11/30/2015      [id number], [place] [address] [facility] [address] [place] [address] Phone: [phone] Fax: [phone]  [facility] [place] [address] [place] [address] Phone: [phone] Fax: [phone]    Your procedure is scheduled on 12-03-2015   Monday .  [facility]30 [facility].M.   Call this number if you have problems the morning of surgery:  [phone]   Remember:  Do not eat food or drink liquids after midnight.   Take these medicines the morning of surgery with A SIP OF WATER none              STOP ASPIRIN,ANTIINFLAMATORIES (IBUPROFEN,ALEVE,MOTRIN,ADVIL,GOODY'S POWDERS),HERBAL SUPPLEMENTS,FISH OIL,AND VITAMINS 5-7 DAYS PRIOR TO SURGERY      Do not wear jewelry, make-up or nail polish.  Do not wear lotions, powders, or perfumes, or deoderant.  Do not shave 48 hours prior to surgery.  Men may shave face and neck.   Do not bring valuables to the hospital.  [facility] is not [facility] any belongings or valuables.  Contacts, dentures or bridgework may not be worn into surgery.  Leave your suitcase in the car.  After surgery it may be brought to your room.  For patients admitted to the hospital, discharge time will be determined by your treatment team.  Patients discharged the day of surgery will not be allowed to drive home.    Special instructions:  See attached Sheet for instructions on CHG showers  Please read over the following fact sheets that you were given. MRSA Information

## 2015-12-01 LAB — VAS US DOPPLER PRE CABG
LCCAPDIAS: 1 cm/s
LCCAPSYS: 122 cm/s
LEFT ECA DIAS: -3 cm/s
LEFT VERTEBRAL DIAS: -1 cm/s
LICAPDIAS: -11 cm/s
Left CCA dist dias: -5 cm/s
Left CCA dist sys: -112 cm/s
Left ICA dist dias: -11 cm/s
Left ICA dist sys: -81 cm/s
Left ICA prox sys: -86 cm/s
RCCADSYS: -137 cm/s
RCCAPDIAS: 2 cm/s
RCCAPSYS: 147 cm/s
RIGHT ECA DIAS: -4 cm/s
RIGHT VERTEBRAL DIAS: -5 cm/s

## 2015-12-01 LAB — HEMOGLOBIN A1C
HEMOGLOBIN A1C: 5 % (ref 4.8–5.6)
MEAN PLASMA GLUCOSE: 97 mg/dL

## 2015-12-02 MED ORDER — DOPAMINE-DEXTROSE 3.2-5 MG/ML-% IV SOLN
0.0000 ug/kg/min | INTRAVENOUS | Status: DC
  Filled 2015-12-02: qty 250

## 2015-12-02 MED ORDER — INSULIN REGULAR HUMAN 100 UNIT/ML IJ SOLN
INTRAMUSCULAR | Status: DC
  Filled 2015-12-02: qty 2.5

## 2015-12-02 MED ORDER — EPINEPHRINE HCL 1 MG/ML IJ SOLN
0.0000 ug/min | INTRAVENOUS | Status: DC
  Filled 2015-12-02: qty 4

## 2015-12-02 MED ORDER — SODIUM CHLORIDE 0.9 % IV SOLN
INTRAVENOUS | Status: DC
  Filled 2015-12-02: qty 40

## 2015-12-02 MED ORDER — NITROGLYCERIN IN D5W 200-5 MCG/ML-% IV SOLN
2.0000 ug/min | INTRAVENOUS | Status: AC
  Administered 2015-12-03: 50 ug/min via INTRAVENOUS
  Filled 2015-12-02: qty 250

## 2015-12-02 MED ORDER — SODIUM CHLORIDE 0.9 % IV SOLN
INTRAVENOUS | Status: DC
  Filled 2015-12-02: qty 30

## 2015-12-02 MED ORDER — PLASMA-LYTE 148 IV SOLN
INTRAVENOUS | Status: DC
  Filled 2015-12-02: qty 2.5

## 2015-12-02 MED ORDER — DEXMEDETOMIDINE HCL IN NACL 400 MCG/100ML IV SOLN
0.1000 ug/kg/h | INTRAVENOUS | Status: DC
  Filled 2015-12-02: qty 100

## 2015-12-02 MED ORDER — VANCOMYCIN HCL 10 G IV SOLR
1250.0000 mg | INTRAVENOUS | Status: AC
  Administered 2015-12-03: 1250 mg via INTRAVENOUS
  Filled 2015-12-02: qty 1250

## 2015-12-02 MED ORDER — DEXTROSE 5 % IV SOLN
750.0000 mg | INTRAVENOUS | Status: DC
  Filled 2015-12-02: qty 750

## 2015-12-02 MED ORDER — METOPROLOL TARTRATE 12.5 MG HALF TABLET
12.5000 mg | ORAL_TABLET | Freq: Once | ORAL | Status: AC
  Administered 2015-12-03: 12.5 mg via ORAL
  Filled 2015-12-02: qty 1

## 2015-12-02 MED ORDER — MAGNESIUM SULFATE 50 % IJ SOLN
40.0000 meq | INTRAMUSCULAR | Status: DC
  Filled 2015-12-02: qty 10

## 2015-12-02 MED ORDER — DEXTROSE 5 % IV SOLN
1.5000 g | INTRAVENOUS | Status: AC
  Administered 2015-12-03: .75 g via INTRAVENOUS
  Administered 2015-12-03: 1.5 g via INTRAVENOUS
  Filled 2015-12-02: qty 1.5

## 2015-12-02 MED ORDER — PHENYLEPHRINE HCL 10 MG/ML IJ SOLN
30.0000 ug/min | INTRAVENOUS | Status: DC
  Filled 2015-12-02: qty 2

## 2015-12-02 MED ORDER — CHLORHEXIDINE GLUCONATE 0.12 % MT SOLN
15.0000 mL | Freq: Once | OROMUCOSAL | Status: AC
  Administered 2015-12-03: 15 mL via OROMUCOSAL
  Filled 2015-12-02: qty 15

## 2015-12-02 MED ORDER — POTASSIUM CHLORIDE 2 MEQ/ML IV SOLN
80.0000 meq | INTRAVENOUS | Status: DC
  Filled 2015-12-02: qty 40

## 2015-12-02 NOTE — Anesthesia Preprocedure Evaluation (Addendum)
Anesthesia Evaluation  Patient identified by MRN, date of birth, ID band Patient awake    Reviewed: Allergy & Precautions, H&P , NPO status , Patient's Chart, lab work & pertinent test results  Airway Mallampati: II  TM Distance: >3 FB Neck ROM: Full    Dental no notable dental hx. (+) Teeth Intact, Dental Advisory Given   Pulmonary neg pulmonary ROS,    Pulmonary exam normal breath sounds clear to auscultation       Cardiovascular + Valvular Problems/Murmurs AI  Rhythm:Regular Rate:Normal     Neuro/Psych  Headaches, Anxiety negative psych ROS   GI/Hepatic negative GI ROS, Neg liver ROS,   Endo/Other  negative endocrine ROS  Renal/GU negative Renal ROS  negative genitourinary   Musculoskeletal   Abdominal   Peds  Hematology negative hematology ROS (+)   Anesthesia Other Findings   Reproductive/Obstetrics negative OB ROS                            Anesthesia Physical Anesthesia Plan  ASA: III  Anesthesia Plan: General   Post-op Pain Management:    Induction: Intravenous  Airway Management Planned: Oral ETT  Additional Equipment: Arterial line, CVP, PA Cath, TEE and Ultrasound Guidance Line Placement  Intra-op Plan:   Post-operative Plan: Post-operative intubation/ventilation  Informed Consent: I have reviewed the patients History and Physical, chart, labs and discussed the procedure including the risks, benefits and alternatives for the proposed anesthesia with the patient or authorized representative who has indicated his/her understanding and acceptance.   Dental advisory given  Plan Discussed with: CRNA, Anesthesiologist and Surgeon  Anesthesia Plan Comments:        Anesthesia Quick Evaluation

## 2015-12-03 ENCOUNTER — Inpatient Hospital Stay (HOSPITAL_COMMUNITY)
Admission: RE | Admit: 2015-12-03 | Discharge: 2015-12-09 | DRG: 220 | Disposition: A | Discharge status: Home / Self Care | Payer: BLUE CROSS/BLUE SHIELD | Source: Ambulatory Visit | Attending: Surgery | Admitting: Surgery

## 2015-12-03 ENCOUNTER — Inpatient Hospital Stay (HOSPITAL_COMMUNITY): Payer: BLUE CROSS/BLUE SHIELD | Admitting: Certified Registered Nurse Anesthetist

## 2015-12-03 ENCOUNTER — Encounter (HOSPITAL_COMMUNITY)
Admission: RE | Disposition: A | Discharge status: Home / Self Care | Payer: Self-pay | Source: Ambulatory Visit | Attending: Surgery

## 2015-12-03 ENCOUNTER — Encounter (HOSPITAL_COMMUNITY): Payer: Self-pay

## 2015-12-03 ENCOUNTER — Inpatient Hospital Stay (HOSPITAL_COMMUNITY): Payer: BLUE CROSS/BLUE SHIELD

## 2015-12-03 DIAGNOSIS — R5383 Other fatigue: Secondary | ICD-10-CM | POA: Diagnosis not present

## 2015-12-03 DIAGNOSIS — Z8249 Family history of ischemic heart disease and other diseases of the circulatory system: Secondary | ICD-10-CM

## 2015-12-03 DIAGNOSIS — Q211 Atrial septal defect: Secondary | ICD-10-CM

## 2015-12-03 DIAGNOSIS — D696 Thrombocytopenia, unspecified: Secondary | ICD-10-CM | POA: Diagnosis not present

## 2015-12-03 DIAGNOSIS — I4891 Unspecified atrial fibrillation: Secondary | ICD-10-CM | POA: Diagnosis not present

## 2015-12-03 DIAGNOSIS — I351 Nonrheumatic aortic (valve) insufficiency: Principal | ICD-10-CM | POA: Diagnosis present

## 2015-12-03 DIAGNOSIS — J90 Pleural effusion, not elsewhere classified: Secondary | ICD-10-CM | POA: Diagnosis not present

## 2015-12-03 DIAGNOSIS — I350 Nonrheumatic aortic (valve) stenosis: Secondary | ICD-10-CM | POA: Diagnosis present

## 2015-12-03 DIAGNOSIS — I10 Essential (primary) hypertension: Secondary | ICD-10-CM | POA: Diagnosis present

## 2015-12-03 DIAGNOSIS — Z952 Presence of prosthetic heart valve: Secondary | ICD-10-CM

## 2015-12-03 DIAGNOSIS — D62 Acute posthemorrhagic anemia: Secondary | ICD-10-CM | POA: Diagnosis not present

## 2015-12-03 DIAGNOSIS — Z79899 Other long term (current) drug therapy: Secondary | ICD-10-CM

## 2015-12-03 DIAGNOSIS — R918 Other nonspecific abnormal finding of lung field: Secondary | ICD-10-CM | POA: Diagnosis not present

## 2015-12-03 DIAGNOSIS — E8770 Fluid overload, unspecified: Secondary | ICD-10-CM | POA: Diagnosis not present

## 2015-12-03 DIAGNOSIS — Z823 Family history of stroke: Secondary | ICD-10-CM | POA: Diagnosis not present

## 2015-12-03 DIAGNOSIS — Z833 Family history of diabetes mellitus: Secondary | ICD-10-CM | POA: Diagnosis not present

## 2015-12-03 DIAGNOSIS — Z95828 Presence of other vascular implants and grafts: Secondary | ICD-10-CM

## 2015-12-03 DIAGNOSIS — I061 Rheumatic aortic insufficiency: Secondary | ICD-10-CM

## 2015-12-03 DIAGNOSIS — J939 Pneumothorax, unspecified: Secondary | ICD-10-CM

## 2015-12-03 DIAGNOSIS — K5900 Constipation, unspecified: Secondary | ICD-10-CM | POA: Diagnosis not present

## 2015-12-03 DIAGNOSIS — I712 Thoracic aortic aneurysm, without rupture: Secondary | ICD-10-CM | POA: Diagnosis not present

## 2015-12-03 DIAGNOSIS — Z419 Encounter for procedure for purposes other than remedying health state, unspecified: Secondary | ICD-10-CM

## 2015-12-03 DIAGNOSIS — Z4682 Encounter for fitting and adjustment of non-vascular catheter: Secondary | ICD-10-CM | POA: Diagnosis not present

## 2015-12-03 HISTORY — PX: REPLACEMENT ASCENDING AORTA: SHX6068

## 2015-12-03 HISTORY — PX: TEE WITHOUT CARDIOVERSION: SHX5443

## 2015-12-03 HISTORY — PX: BENTALL PROCEDURE: SHX5058

## 2015-12-03 LAB — POCT I-STAT 3, ART BLOOD GAS (G3+)
ACID-BASE DEFICIT: 1 mmol/L (ref 0.0–2.0)
ACID-BASE DEFICIT: 1 mmol/L (ref 0.0–2.0)
ACID-BASE DEFICIT: 3 mmol/L — AB (ref 0.0–2.0)
ACID-BASE DEFICIT: 3 mmol/L — AB (ref 0.0–2.0)
BICARBONATE: 23.6 mmol/L (ref 20.0–28.0)
BICARBONATE: 23.4 mmol/L (ref 20.0–28.0)
Bicarbonate: 25.1 mmol/L (ref 20.0–28.0)
Bicarbonate: 25.9 mmol/L (ref 20.0–28.0)
Bicarbonate: 22.8 mmol/L (ref 20.0–28.0)
O2 SAT: 100 %
O2 SAT: 100 %
O2 SAT: 98 %
O2 Saturation: 99 %
O2 Saturation: 98 %
PH ART: 7.354 (ref 7.350–7.450)
PH ART: 7.339 — AB (ref 7.350–7.450)
PO2 ART: 323 mmHg — AB (ref 83.0–108.0)
TCO2: 25 mmol/L (ref 0–100)
TCO2: 26 mmol/L (ref 0–100)
TCO2: 25 mmol/L (ref 0–100)
TCO2: 27 mmol/L (ref 0–100)
TCO2: 24 mmol/L (ref 0–100)
pCO2 arterial: 38.7 mmHg (ref 32.0–48.0)
pCO2 arterial: 45.1 mmHg (ref 32.0–48.0)
pCO2 arterial: 41.7 mmHg (ref 32.0–48.0)
pCO2 arterial: 46 mmHg (ref 32.0–48.0)
pCO2 arterial: 42.1 mmHg (ref 32.0–48.0)
pH, Arterial: 7.393 (ref 7.350–7.450)
pH, Arterial: 7.353 (ref 7.350–7.450)
pH, Arterial: 7.351 (ref 7.350–7.450)
pO2, Arterial: 396 mmHg — ABNORMAL HIGH (ref 83.0–108.0)
pO2, Arterial: 109 mmHg — ABNORMAL HIGH (ref 83.0–108.0)
pO2, Arterial: 134 mmHg — ABNORMAL HIGH (ref 83.0–108.0)
pO2, Arterial: 105 mmHg (ref 83.0–108.0)

## 2015-12-03 LAB — POCT I-STAT, CHEM 8
BUN: 13 mg/dL (ref 6–20)
BUN: 13 mg/dL (ref 6–20)
BUN: 12 mg/dL (ref 6–20)
BUN: 13 mg/dL (ref 6–20)
BUN: 12 mg/dL (ref 6–20)
BUN: 11 mg/dL (ref 6–20)
BUN: 11 mg/dL (ref 6–20)
CALCIUM ION: 1.24 mmol/L (ref 1.15–1.40)
CALCIUM ION: 1.04 mmol/L — AB (ref 1.15–1.40)
CALCIUM ION: 0.98 mmol/L — AB (ref 1.15–1.40)
CHLORIDE: 107 mmol/L (ref 101–111)
CREATININE: 0.5 mg/dL (ref 0.44–1.00)
CREATININE: 0.6 mg/dL (ref 0.44–1.00)
CREATININE: 0.7 mg/dL (ref 0.44–1.00)
CREATININE: 0.6 mg/dL (ref 0.44–1.00)
CREATININE: 0.7 mg/dL (ref 0.44–1.00)
Calcium, Ion: 1.2 mmol/L (ref 1.15–1.40)
Calcium, Ion: 0.91 mmol/L — ABNORMAL LOW (ref 1.15–1.40)
Calcium, Ion: 0.94 mmol/L — ABNORMAL LOW (ref 1.15–1.40)
Calcium, Ion: 1.07 mmol/L — ABNORMAL LOW (ref 1.15–1.40)
Chloride: 106 mmol/L (ref 101–111)
Chloride: 97 mmol/L — ABNORMAL LOW (ref 101–111)
Chloride: 101 mmol/L (ref 101–111)
Chloride: 100 mmol/L — ABNORMAL LOW (ref 101–111)
Chloride: 102 mmol/L (ref 101–111)
Chloride: 108 mmol/L (ref 101–111)
Creatinine, Ser: 0.6 mg/dL (ref 0.44–1.00)
Creatinine, Ser: 0.4 mg/dL — ABNORMAL LOW (ref 0.44–1.00)
GLUCOSE: 176 mg/dL — AB (ref 65–99)
GLUCOSE: 138 mg/dL — AB (ref 65–99)
Glucose, Bld: 101 mg/dL — ABNORMAL HIGH (ref 65–99)
Glucose, Bld: 110 mg/dL — ABNORMAL HIGH (ref 65–99)
Glucose, Bld: 175 mg/dL — ABNORMAL HIGH (ref 65–99)
Glucose, Bld: 155 mg/dL — ABNORMAL HIGH (ref 65–99)
Glucose, Bld: 163 mg/dL — ABNORMAL HIGH (ref 65–99)
HCT: 31 % — ABNORMAL LOW (ref 36.0–46.0)
HCT: 22 % — ABNORMAL LOW (ref 36.0–46.0)
HCT: 25 % — ABNORMAL LOW (ref 36.0–46.0)
HCT: 20 % — ABNORMAL LOW (ref 36.0–46.0)
HEMATOCRIT: 29 % — AB (ref 36.0–46.0)
HEMATOCRIT: 24 % — AB (ref 36.0–46.0)
HEMATOCRIT: 35 % — AB (ref 36.0–46.0)
HEMOGLOBIN: 8.2 g/dL — AB (ref 12.0–15.0)
HEMOGLOBIN: 6.8 g/dL — AB (ref 12.0–15.0)
HEMOGLOBIN: 11.9 g/dL — AB (ref 12.0–15.0)
Hemoglobin: 10.5 g/dL — ABNORMAL LOW (ref 12.0–15.0)
Hemoglobin: 9.9 g/dL — ABNORMAL LOW (ref 12.0–15.0)
Hemoglobin: 7.5 g/dL — ABNORMAL LOW (ref 12.0–15.0)
Hemoglobin: 8.5 g/dL — ABNORMAL LOW (ref 12.0–15.0)
POTASSIUM: 4 mmol/L (ref 3.5–5.1)
POTASSIUM: 4.5 mmol/L (ref 3.5–5.1)
Potassium: 4 mmol/L (ref 3.5–5.1)
Potassium: 4.4 mmol/L (ref 3.5–5.1)
Potassium: 5.2 mmol/L — ABNORMAL HIGH (ref 3.5–5.1)
Potassium: 5.4 mmol/L — ABNORMAL HIGH (ref 3.5–5.1)
Potassium: 4.6 mmol/L (ref 3.5–5.1)
SODIUM: 141 mmol/L (ref 135–145)
SODIUM: 134 mmol/L — AB (ref 135–145)
SODIUM: 135 mmol/L (ref 135–145)
SODIUM: 141 mmol/L (ref 135–145)
Sodium: 140 mmol/L (ref 135–145)
Sodium: 129 mmol/L — ABNORMAL LOW (ref 135–145)
Sodium: 139 mmol/L (ref 135–145)
TCO2: 26 mmol/L (ref 0–100)
TCO2: 26 mmol/L (ref 0–100)
TCO2: 25 mmol/L (ref 0–100)
TCO2: 25 mmol/L (ref 0–100)
TCO2: 23 mmol/L (ref 0–100)
TCO2: 26 mmol/L (ref 0–100)
TCO2: 22 mmol/L (ref 0–100)

## 2015-12-03 LAB — CBC
HCT: 36.3 % (ref 36.0–46.0)
HEMATOCRIT: 37.1 % (ref 36.0–46.0)
Hemoglobin: 12.6 g/dL (ref 12.0–15.0)
Hemoglobin: 12.4 g/dL (ref 12.0–15.0)
MCH: 30.7 pg (ref 26.0–34.0)
MCH: 30.3 pg (ref 26.0–34.0)
MCHC: 34.7 g/dL (ref 30.0–36.0)
MCHC: 33.4 g/dL (ref 30.0–36.0)
MCV: 88.3 fL (ref 78.0–100.0)
MCV: 90.7 fL (ref 78.0–100.0)
PLATELETS: 93 10*3/uL — AB (ref 150–400)
Platelets: 86 10*3/uL — ABNORMAL LOW (ref 150–400)
RBC: 4.11 MIL/uL (ref 3.87–5.11)
RBC: 4.09 MIL/uL (ref 3.87–5.11)
RDW: 12.9 % (ref 11.5–15.5)
RDW: 13.3 % (ref 11.5–15.5)
WBC: 9.7 10*3/uL (ref 4.0–10.5)
WBC: 11.1 10*3/uL — AB (ref 4.0–10.5)

## 2015-12-03 LAB — GLUCOSE, CAPILLARY
GLUCOSE-CAPILLARY: 122 mg/dL — AB (ref 65–99)
Glucose-Capillary: 103 mg/dL — ABNORMAL HIGH (ref 65–99)
Glucose-Capillary: 112 mg/dL — ABNORMAL HIGH (ref 65–99)

## 2015-12-03 LAB — POCT I-STAT 4, (NA,K, GLUC, HGB,HCT)
GLUCOSE: 79 mg/dL (ref 65–99)
HEMATOCRIT: 33 % — AB (ref 36.0–46.0)
Hemoglobin: 11.2 g/dL — ABNORMAL LOW (ref 12.0–15.0)
Potassium: 3.5 mmol/L (ref 3.5–5.1)
Sodium: 144 mmol/L (ref 135–145)

## 2015-12-03 LAB — MAGNESIUM: Magnesium: 3 mg/dL — ABNORMAL HIGH (ref 1.7–2.4)

## 2015-12-03 LAB — PLATELET COUNT: PLATELETS: 89 10*3/uL — AB (ref 150–400)

## 2015-12-03 LAB — HEMOGLOBIN AND HEMATOCRIT, BLOOD
HCT: 24.5 % — ABNORMAL LOW (ref 36.0–46.0)
HEMOGLOBIN: 8.4 g/dL — AB (ref 12.0–15.0)

## 2015-12-03 LAB — PROTIME-INR
INR: 1.36
PROTHROMBIN TIME: 16.9 s — AB (ref 11.4–15.2)

## 2015-12-03 LAB — APTT: aPTT: 29 seconds (ref 24–36)

## 2015-12-03 LAB — CREATININE, SERUM
Creatinine, Ser: 0.83 mg/dL (ref 0.44–1.00)
GFR calc non Af Amer: >60 mL/min (ref >60)

## 2015-12-03 LAB — PREPARE RBC (CROSSMATCH)

## 2015-12-03 LAB — FIBRINOGEN: Fibrinogen: 150 mg/dL — ABNORMAL LOW (ref 210–475)

## 2015-12-03 SURGERY — BENTALL PROCEDURE
Anesthesia: General | Site: Chest

## 2015-12-03 MED ORDER — LACTATED RINGERS IV SOLN: INTRAVENOUS | Status: DC

## 2015-12-03 MED ORDER — SODIUM CHLORIDE 0.9 % IV SOLN: 250.0000 mL | INTRAVENOUS | Status: DC

## 2015-12-03 MED ORDER — SUFENTANIL CITRATE 250 MCG/5ML IV SOLN
INTRAVENOUS | Status: AC
  Filled 2015-12-03: qty 5

## 2015-12-03 MED ORDER — CHLORHEXIDINE GLUCONATE CLOTH 2 % EX PADS
6.0000 | MEDICATED_PAD | Freq: Every day | CUTANEOUS | Status: AC
  Administered 2015-12-03 – 2015-12-07 (×5): 6 via TOPICAL

## 2015-12-03 MED ORDER — MUPIROCIN 2 % EX OINT
1.0000 "application " | TOPICAL_OINTMENT | Freq: Two times a day (BID) | CUTANEOUS | Status: AC
  Administered 2015-12-03 – 2015-12-08 (×10): 1 via NASAL
  Filled 2015-12-03 (×2): qty 22

## 2015-12-03 MED ORDER — DEXMEDETOMIDINE HCL IN NACL 200 MCG/50ML IV SOLN
0.0000 ug/kg/h | INTRAVENOUS | Status: DC
  Filled 2015-12-03 (×2): qty 50

## 2015-12-03 MED ORDER — DEXMEDETOMIDINE HCL IN NACL 200 MCG/50ML IV SOLN
INTRAVENOUS | Status: DC | PRN
  Administered 2015-12-03: 0.4 ug/kg/h via INTRAVENOUS

## 2015-12-03 MED ORDER — CHLORHEXIDINE GLUCONATE 0.12% ORAL RINSE (MEDLINE KIT)
15.0000 mL | Freq: Two times a day (BID) | OROMUCOSAL | Status: DC
  Administered 2015-12-03 – 2015-12-08 (×5): 15 mL via OROMUCOSAL

## 2015-12-03 MED ORDER — ARTIFICIAL TEARS OP OINT
TOPICAL_OINTMENT | OPHTHALMIC | Status: DC | PRN
  Administered 2015-12-03: 1 via OPHTHALMIC

## 2015-12-03 MED ORDER — PROPOFOL 10 MG/ML IV BOLUS
INTRAVENOUS | Status: DC | PRN
  Administered 2015-12-03: 200 mg via INTRAVENOUS
  Administered 2015-12-03: 80 mg via INTRAVENOUS

## 2015-12-03 MED ORDER — TRAMADOL HCL 50 MG PO TABS
50.0000 mg | ORAL_TABLET | ORAL | Status: DC | PRN
  Administered 2015-12-04 – 2015-12-07 (×3): 50 mg via ORAL
  Filled 2015-12-03 (×2): qty 1
  Filled 2015-12-03: qty 2

## 2015-12-03 MED ORDER — ALBUMIN HUMAN 5 % IV SOLN
250.0000 mL | INTRAVENOUS | Status: AC | PRN
  Administered 2015-12-03 (×2): 250 mL via INTRAVENOUS

## 2015-12-03 MED ORDER — ACETAMINOPHEN 160 MG/5ML PO SOLN: 1000.0000 mg | Freq: Four times a day (QID) | ORAL | Status: AC

## 2015-12-03 MED ORDER — PROTAMINE SULFATE 10 MG/ML IV SOLN
INTRAVENOUS | Status: DC | PRN
  Administered 2015-12-03: 80 mg via INTRAVENOUS
  Administered 2015-12-03: 10 mg via INTRAVENOUS
  Administered 2015-12-03: 70 mg via INTRAVENOUS

## 2015-12-03 MED ORDER — VANCOMYCIN HCL IN DEXTROSE 1-5 GM/200ML-% IV SOLN
1000.0000 mg | Freq: Once | INTRAVENOUS | Status: AC
  Administered 2015-12-03: 1000 mg via INTRAVENOUS
  Filled 2015-12-03: qty 200

## 2015-12-03 MED ORDER — SODIUM CHLORIDE 0.9 % IV SOLN: INTRAVENOUS | Status: DC

## 2015-12-03 MED ORDER — ASPIRIN 81 MG PO CHEW: 324.0000 mg | CHEWABLE_TABLET | Freq: Every day | ORAL | Status: DC

## 2015-12-03 MED ORDER — LACTATED RINGERS IV SOLN
INTRAVENOUS | Status: DC | PRN
  Administered 2015-12-03: 07:00:00 via INTRAVENOUS

## 2015-12-03 MED ORDER — SUFENTANIL CITRATE 50 MCG/ML IV SOLN
INTRAVENOUS | Status: DC | PRN
  Administered 2015-12-03: 50 ug via INTRAVENOUS
  Administered 2015-12-03: 5 ug via INTRAVENOUS
  Administered 2015-12-03: 50 ug via INTRAVENOUS
  Administered 2015-12-03: 45 ug via INTRAVENOUS

## 2015-12-03 MED ORDER — SODIUM CHLORIDE 0.9% FLUSH: 3.0000 mL | INTRAVENOUS | Status: DC | PRN

## 2015-12-03 MED ORDER — HEMOSTATIC AGENTS (NO CHARGE) OPTIME
TOPICAL | Status: DC | PRN
  Administered 2015-12-03 (×2): 1 via TOPICAL

## 2015-12-03 MED ORDER — ACETAMINOPHEN 650 MG RE SUPP
650.0000 mg | Freq: Once | RECTAL | Status: AC
  Administered 2015-12-03: 650 mg via RECTAL

## 2015-12-03 MED ORDER — ASPIRIN EC 325 MG PO TBEC: 325.0000 mg | DELAYED_RELEASE_TABLET | Freq: Every day | ORAL | Status: DC

## 2015-12-03 MED ORDER — DEXTROSE 5 % IV SOLN
INTRAVENOUS | Status: DC | PRN
  Administered 2015-12-03: 40 ug/min via INTRAVENOUS

## 2015-12-03 MED ORDER — OMEGA-3-ACID ETHYL ESTERS 1 G PO CAPS
2.0000 g | ORAL_CAPSULE | Freq: Every day | ORAL | Status: DC
  Administered 2015-12-05 – 2015-12-09 (×5): 2 g via ORAL
  Filled 2015-12-03 (×6): qty 2

## 2015-12-03 MED ORDER — INSULIN ASPART 100 UNIT/ML ~~LOC~~ SOLN
0.0000 [IU] | SUBCUTANEOUS | Status: DC
  Administered 2015-12-03: 4 [IU] via SUBCUTANEOUS
  Administered 2015-12-04 (×2): 2 [IU] via SUBCUTANEOUS

## 2015-12-03 MED ORDER — DEXTROSE 5 % IV SOLN
1.5000 g | Freq: Two times a day (BID) | INTRAVENOUS | Status: AC
  Administered 2015-12-03 – 2015-12-05 (×4): 1.5 g via INTRAVENOUS
  Filled 2015-12-03 (×4): qty 1.5

## 2015-12-03 MED ORDER — ORAL CARE MOUTH RINSE
15.0000 mL | Freq: Four times a day (QID) | OROMUCOSAL | Status: DC
  Administered 2015-12-03 – 2015-12-09 (×15): 15 mL via OROMUCOSAL

## 2015-12-03 MED ORDER — PROPOFOL 10 MG/ML IV BOLUS
INTRAVENOUS | Status: AC
  Filled 2015-12-03: qty 20

## 2015-12-03 MED ORDER — MORPHINE SULFATE (PF) 2 MG/ML IV SOLN
1.0000 mg | INTRAVENOUS | Status: AC | PRN
  Administered 2015-12-03: 2 mg via INTRAVENOUS
  Administered 2015-12-03 (×2): 4 mg via INTRAVENOUS
  Administered 2015-12-03: 2 mg via INTRAVENOUS
  Administered 2015-12-03: 4 mg via INTRAVENOUS
  Administered 2015-12-04: 2 mg via INTRAVENOUS
  Administered 2015-12-04: 4 mg via INTRAVENOUS
  Filled 2015-12-03 (×4): qty 2
  Filled 2015-12-03: qty 1

## 2015-12-03 MED ORDER — ACETAMINOPHEN 160 MG/5ML PO SOLN: 650.0000 mg | Freq: Once | ORAL | Status: AC

## 2015-12-03 MED ORDER — PROGESTERONE MICRONIZED 100 MG PO CAPS
100.0000 mg | ORAL_CAPSULE | Freq: Every day | ORAL | Status: DC
  Administered 2015-12-04 – 2015-12-08 (×5): 100 mg via ORAL
  Filled 2015-12-03 (×5): qty 1

## 2015-12-03 MED ORDER — OXYCODONE HCL 5 MG PO TABS
5.0000 mg | ORAL_TABLET | ORAL | Status: DC | PRN
  Administered 2015-12-03: 5 mg via ORAL
  Administered 2015-12-04 – 2015-12-05 (×5): 10 mg via ORAL
  Filled 2015-12-03 (×3): qty 2
  Filled 2015-12-03: qty 1
  Filled 2015-12-03 (×3): qty 2

## 2015-12-03 MED ORDER — LACTATED RINGERS IV SOLN: 500.0000 mL | Freq: Once | INTRAVENOUS | Status: DC | PRN

## 2015-12-03 MED ORDER — HYDROCORTISONE NA SUCCINATE PF 100 MG IJ SOLR
INTRAMUSCULAR | Status: DC | PRN
  Administered 2015-12-03: 250 mg via INTRAVENOUS

## 2015-12-03 MED ORDER — HEPARIN SODIUM (PORCINE) 1000 UNIT/ML IJ SOLN
INTRAMUSCULAR | Status: DC | PRN
  Administered 2015-12-03: 18000 [IU] via INTRAVENOUS

## 2015-12-03 MED ORDER — METOPROLOL TARTRATE 5 MG/5ML IV SOLN
2.5000 mg | INTRAVENOUS | Status: DC | PRN
  Administered 2015-12-06: 2.5 mg via INTRAVENOUS
  Filled 2015-12-03: qty 5

## 2015-12-03 MED ORDER — NITROGLYCERIN IN D5W 200-5 MCG/ML-% IV SOLN: 0.0000 ug/min | INTRAVENOUS | Status: DC

## 2015-12-03 MED ORDER — SODIUM CHLORIDE 0.9% FLUSH
3.0000 mL | Freq: Two times a day (BID) | INTRAVENOUS | Status: DC
  Administered 2015-12-04 – 2015-12-09 (×10): 3 mL via INTRAVENOUS

## 2015-12-03 MED ORDER — MAGNESIUM SULFATE 4 GM/100ML IV SOLN
4.0000 g | Freq: Once | INTRAVENOUS | Status: AC
  Administered 2015-12-03: 4 g via INTRAVENOUS
  Filled 2015-12-03: qty 100

## 2015-12-03 MED ORDER — METOPROLOL TARTRATE 25 MG/10 ML ORAL SUSPENSION: 12.5000 mg | Freq: Two times a day (BID) | ORAL | Status: DC

## 2015-12-03 MED ORDER — ADULT MULTIVITAMIN W/MINERALS CH
1.0000 | ORAL_TABLET | Freq: Every day | ORAL | Status: DC
  Administered 2015-12-05 – 2015-12-09 (×5): 1 via ORAL
  Filled 2015-12-03 (×5): qty 1

## 2015-12-03 MED ORDER — INSULIN REGULAR BOLUS VIA INFUSION
0.0000 [IU] | Freq: Three times a day (TID) | INTRAVENOUS | Status: DC
  Filled 2015-12-03: qty 10

## 2015-12-03 MED ORDER — THROMBIN 20000 UNITS EX SOLR
OROMUCOSAL | Status: DC | PRN
  Administered 2015-12-03: 17 mL via TOPICAL

## 2015-12-03 MED ORDER — HYDROCORTISONE NA SUCCINATE PF 250 MG IJ SOLR
INTRAMUSCULAR | Status: AC
  Filled 2015-12-03: qty 250

## 2015-12-03 MED ORDER — CHLORHEXIDINE GLUCONATE 0.12 % MT SOLN
15.0000 mL | OROMUCOSAL | Status: AC
  Administered 2015-12-03: 15 mL via OROMUCOSAL

## 2015-12-03 MED ORDER — POTASSIUM CHLORIDE 10 MEQ/50ML IV SOLN
10.0000 meq | INTRAVENOUS | Status: AC
  Administered 2015-12-03 (×3): 10 meq via INTRAVENOUS

## 2015-12-03 MED ORDER — PHENYLEPHRINE HCL 10 MG/ML IJ SOLN
0.0000 ug/min | INTRAVENOUS | Status: DC
  Filled 2015-12-03: qty 2

## 2015-12-03 MED ORDER — SODIUM CHLORIDE 0.9 % IV SOLN
INTRAVENOUS | Status: DC
  Filled 2015-12-03: qty 2.5

## 2015-12-03 MED ORDER — POTASSIUM CHLORIDE 10 MEQ/50ML IV SOLN
10.0000 meq | Freq: Once | INTRAVENOUS | Status: AC
  Administered 2015-12-03: 10 meq via INTRAVENOUS

## 2015-12-03 MED ORDER — FAMOTIDINE IN NACL 20-0.9 MG/50ML-% IV SOLN
20.0000 mg | Freq: Two times a day (BID) | INTRAVENOUS | Status: AC
  Administered 2015-12-03: 20 mg via INTRAVENOUS

## 2015-12-03 MED ORDER — PANTOPRAZOLE SODIUM 40 MG PO TBEC
40.0000 mg | DELAYED_RELEASE_TABLET | Freq: Every day | ORAL | Status: DC
  Administered 2015-12-05 – 2015-12-09 (×5): 40 mg via ORAL
  Filled 2015-12-03 (×5): qty 1

## 2015-12-03 MED ORDER — CHLORHEXIDINE GLUCONATE 4 % EX LIQD: 30.0000 mL | CUTANEOUS | Status: DC

## 2015-12-03 MED ORDER — BISACODYL 10 MG RE SUPP
10.0000 mg | Freq: Every day | RECTAL | Status: DC
  Administered 2015-12-08: 10 mg via RECTAL
  Filled 2015-12-03 (×2): qty 1

## 2015-12-03 MED ORDER — THROMBIN 20000 UNITS EX SOLR
CUTANEOUS | Status: AC
  Filled 2015-12-03: qty 20000

## 2015-12-03 MED ORDER — DOCUSATE SODIUM 100 MG PO CAPS
200.0000 mg | ORAL_CAPSULE | Freq: Every day | ORAL | Status: DC
  Administered 2015-12-04 – 2015-12-08 (×5): 200 mg via ORAL
  Filled 2015-12-03 (×6): qty 2

## 2015-12-03 MED ORDER — VECURONIUM BROMIDE 10 MG IV SOLR
INTRAVENOUS | Status: AC
  Filled 2015-12-03: qty 10

## 2015-12-03 MED ORDER — SODIUM CHLORIDE 0.9 % IV SOLN
INTRAVENOUS | Status: DC | PRN
  Administered 2015-12-03: 5 g/h via INTRAVENOUS

## 2015-12-03 MED ORDER — BISACODYL 5 MG PO TBEC
10.0000 mg | DELAYED_RELEASE_TABLET | Freq: Every day | ORAL | Status: DC
  Administered 2015-12-04 – 2015-12-06 (×3): 10 mg via ORAL
  Filled 2015-12-03 (×6): qty 2

## 2015-12-03 MED ORDER — SODIUM CHLORIDE 0.9 % IV SOLN
INTRAVENOUS | Status: DC | PRN
  Administered 2015-12-03: .8 [IU]/h via INTRAVENOUS

## 2015-12-03 MED ORDER — ACETAMINOPHEN 500 MG PO TABS
1000.0000 mg | ORAL_TABLET | Freq: Four times a day (QID) | ORAL | Status: AC
  Administered 2015-12-04 – 2015-12-08 (×15): 1000 mg via ORAL
  Filled 2015-12-03 (×16): qty 2

## 2015-12-03 MED ORDER — MIDAZOLAM HCL 5 MG/5ML IJ SOLN
INTRAMUSCULAR | Status: DC | PRN
  Administered 2015-12-03: 2 mg via INTRAVENOUS
  Administered 2015-12-03: 3 mg via INTRAVENOUS
  Administered 2015-12-03: 2 mg via INTRAVENOUS

## 2015-12-03 MED ORDER — VECURONIUM BROMIDE 10 MG IV SOLR
INTRAVENOUS | Status: DC | PRN
  Administered 2015-12-03: 7 mg via INTRAVENOUS
  Administered 2015-12-03: 3 mg via INTRAVENOUS
  Administered 2015-12-03: 5 mg via INTRAVENOUS
  Administered 2015-12-03: 3 mg via INTRAVENOUS
  Administered 2015-12-03: 4 mg via INTRAVENOUS
  Administered 2015-12-03: 2 mg via INTRAVENOUS

## 2015-12-03 MED ORDER — SODIUM CHLORIDE 0.9 % IV SOLN: Freq: Once | INTRAVENOUS | Status: DC

## 2015-12-03 MED ORDER — MIDAZOLAM HCL 2 MG/2ML IJ SOLN: 2.0000 mg | INTRAMUSCULAR | Status: DC | PRN

## 2015-12-03 MED ORDER — MIDAZOLAM HCL 10 MG/2ML IJ SOLN
INTRAMUSCULAR | Status: AC
  Filled 2015-12-03: qty 2

## 2015-12-03 MED ORDER — LACTATED RINGERS IV SOLN
INTRAVENOUS | Status: DC | PRN
  Administered 2015-12-03 (×2): via INTRAVENOUS

## 2015-12-03 MED ORDER — MORPHINE SULFATE (PF) 2 MG/ML IV SOLN
2.0000 mg | INTRAVENOUS | Status: DC | PRN
  Administered 2015-12-04 (×5): 2 mg via INTRAVENOUS
  Administered 2015-12-05: 4 mg via INTRAVENOUS
  Filled 2015-12-03 (×2): qty 1
  Filled 2015-12-03: qty 2
  Filled 2015-12-03 (×5): qty 1

## 2015-12-03 MED ORDER — HEMOSTATIC AGENTS (NO CHARGE) OPTIME
TOPICAL | Status: DC | PRN
  Administered 2015-12-03: 1 via TOPICAL

## 2015-12-03 MED ORDER — ONDANSETRON HCL 4 MG/2ML IJ SOLN
4.0000 mg | Freq: Four times a day (QID) | INTRAMUSCULAR | Status: DC | PRN
  Administered 2015-12-03 – 2015-12-04 (×3): 4 mg via INTRAVENOUS
  Filled 2015-12-03 (×3): qty 2

## 2015-12-03 MED ORDER — ARTIFICIAL TEARS OP OINT
TOPICAL_OINTMENT | OPHTHALMIC | Status: AC
  Filled 2015-12-03: qty 3.5

## 2015-12-03 MED ORDER — SODIUM CHLORIDE 0.45 % IV SOLN
INTRAVENOUS | Status: DC | PRN
  Administered 2015-12-03: 14:00:00 via INTRAVENOUS

## 2015-12-03 MED ORDER — VECURONIUM BROMIDE 10 MG IV SOLR
INTRAVENOUS | Status: AC
  Filled 2015-12-03: qty 20

## 2015-12-03 MED ORDER — METOPROLOL TARTRATE 12.5 MG HALF TABLET
12.5000 mg | ORAL_TABLET | Freq: Two times a day (BID) | ORAL | Status: DC
  Administered 2015-12-04 – 2015-12-05 (×4): 12.5 mg via ORAL
  Filled 2015-12-03 (×4): qty 1

## 2015-12-03 MED FILL — Magnesium Sulfate Inj 50%: INTRAMUSCULAR | Qty: 10 | Status: AC

## 2015-12-03 MED FILL — Heparin Sodium (Porcine) Inj 1000 Unit/ML: INTRAMUSCULAR | Qty: 30 | Status: AC

## 2015-12-03 MED FILL — Potassium Chloride Inj 2 mEq/ML: INTRAVENOUS | Qty: 40 | Status: AC

## 2015-12-03 SURGICAL SUPPLY — 87 items
ADAPTER CARDIO PERF ANTE/RETRO (ADAPTER) ×6 IMPLANT
APPLICATOR TIP COSEAL (VASCULAR PRODUCTS) IMPLANT
ATTRACTOMAT 16X20 MAGNETIC DRP (DRAPES) IMPLANT
BAG DECANTER FOR FLEXI CONT (MISCELLANEOUS) ×3 IMPLANT
BLADE STERNUM SYSTEM 6 (BLADE) ×3 IMPLANT
BLADE SURG 15 STRL LF DISP TIS (BLADE) ×2 IMPLANT
BLADE SURG 15 STRL SS (BLADE) ×1
BOOT SUTURE AID YELLOW STND (SUTURE) ×3 IMPLANT
CANISTER SUCTION 2500CC (MISCELLANEOUS) ×3 IMPLANT
CANNULA GUNDRY RCSP 15FR (MISCELLANEOUS) ×6 IMPLANT
CATH HEART VENT LEFT (CATHETERS) ×2 IMPLANT
CATH ROBINSON RED A/P 18FR (CATHETERS) ×12 IMPLANT
CATH THORACIC 36FR (CATHETERS) ×3 IMPLANT
CATH THORACIC 36FR RT ANG (CATHETERS) ×3 IMPLANT
CAUTERY HIGH TEMP VAS (MISCELLANEOUS) ×3 IMPLANT
CAUTERY SURG HI TEMP FINE TIP (MISCELLANEOUS) ×3 IMPLANT
CONT SPEC 4OZ CLIKSEAL STRL BL (MISCELLANEOUS) ×6 IMPLANT
CONT SPEC STER OR (MISCELLANEOUS) ×3 IMPLANT
COVER SURGICAL LIGHT HANDLE (MISCELLANEOUS) ×6 IMPLANT
CRADLE DONUT ADULT HEAD (MISCELLANEOUS) ×3 IMPLANT
DRAPE SLUSH/WARMER DISC (DRAPES) ×3 IMPLANT
DRSG COVADERM 4X14 (GAUZE/BANDAGES/DRESSINGS) ×3 IMPLANT
ELECT CAUTERY BLADE 6.4 (BLADE) ×3 IMPLANT
ELECT REM PT RETURN 9FT ADLT (ELECTROSURGICAL) ×6
ELECTRODE REM PT RTRN 9FT ADLT (ELECTROSURGICAL) ×4 IMPLANT
FELT TEFLON 1X6 (MISCELLANEOUS) ×3 IMPLANT
GAUZE SPONGE 4X4 12PLY STRL (GAUZE/BANDAGES/DRESSINGS) ×3 IMPLANT
GLOVE BIO SURGEON STRL SZ 6 (GLOVE) ×6 IMPLANT
GLOVE BIO SURGEON STRL SZ 6.5 (GLOVE) ×6 IMPLANT
GLOVE BIO SURGEON STRL SZ7 (GLOVE) IMPLANT
GLOVE BIO SURGEON STRL SZ7.5 (GLOVE) IMPLANT
GLOVE BIOGEL PI IND STRL 6.5 (GLOVE) ×10 IMPLANT
GLOVE BIOGEL PI INDICATOR 6.5 (GLOVE) ×5
GLOVE EUDERMIC 7 POWDERFREE (GLOVE) ×6 IMPLANT
GOWN STRL REUS W/ TWL LRG LVL3 (GOWN DISPOSABLE) IMPLANT
GOWN STRL REUS W/ TWL XL LVL3 (GOWN DISPOSABLE) IMPLANT
GOWN STRL REUS W/TWL LRG LVL3 (GOWN DISPOSABLE)
GOWN STRL REUS W/TWL XL LVL3 (GOWN DISPOSABLE)
GRAFT HEMASHIELD 28X40 (Vascular Products) ×3 IMPLANT
HEART VENT LT CURVED (MISCELLANEOUS) ×3 IMPLANT
HEMOSTAT POWDER SURGIFOAM 1G (HEMOSTASIS) ×9 IMPLANT
HEMOSTAT SURGICEL 2X14 (HEMOSTASIS) ×6 IMPLANT
KIT BASIN OR (CUSTOM PROCEDURE TRAY) ×3 IMPLANT
KIT CATH CPB BARTLE (MISCELLANEOUS) ×3 IMPLANT
KIT ROOM TURNOVER OR (KITS) ×3 IMPLANT
KIT SUCTION CATH 14FR (SUCTIONS) ×6 IMPLANT
LINE VENT (MISCELLANEOUS) ×3 IMPLANT
NS IRRIG 1000ML POUR BTL (IV SOLUTION) ×15 IMPLANT
PACK OPEN HEART (CUSTOM PROCEDURE TRAY) ×3 IMPLANT
PAD ARMBOARD 7.5X6 YLW CONV (MISCELLANEOUS) ×6 IMPLANT
SEALANT SURG COSEAL 8ML (VASCULAR PRODUCTS) ×6 IMPLANT
SET CARDIOPLEGIA MPS 5001102 (MISCELLANEOUS) ×3 IMPLANT
SET VEIN GRAFT PERF (SET/KITS/TRAYS/PACK) ×3 IMPLANT
SPONGE GAUZE 4X4 12PLY STER LF (GAUZE/BANDAGES/DRESSINGS) ×3 IMPLANT
SUT BONE WAX W31G (SUTURE) ×3 IMPLANT
SUT ETHIBON 2 0 V 52N 30 (SUTURE) ×12 IMPLANT
SUT ETHIBON EXCEL 2-0 V-5 (SUTURE) ×3 IMPLANT
SUT ETHIBOND V-5 VALVE (SUTURE) IMPLANT
SUT PROLENE 3 0 SH 1 (SUTURE) ×3 IMPLANT
SUT PROLENE 3 0 SH 48 (SUTURE) ×3 IMPLANT
SUT PROLENE 3 0 SH DA (SUTURE) ×3 IMPLANT
SUT PROLENE 3 0 SH1 36 (SUTURE) ×3 IMPLANT
SUT PROLENE 4 0 RB 1 (SUTURE) ×6
SUT PROLENE 4-0 RB1 .5 CRCL 36 (SUTURE) ×12 IMPLANT
SUT PROLENE 5 0 C 1 36 (SUTURE) ×3 IMPLANT
SUT PROLENE 5 0 RB 2 (SUTURE) IMPLANT
SUT STEEL 6MS V (SUTURE) ×6 IMPLANT
SUT STEEL STERNAL CCS#1 18IN (SUTURE) IMPLANT
SUT STEEL SZ 6 DBL 3X14 BALL (SUTURE) IMPLANT
SUT VIC AB 1 CTX 36 (SUTURE) ×3
SUT VIC AB 1 CTX36XBRD ANBCTR (SUTURE) ×6 IMPLANT
SUT VIC AB 2-0 CT1 27 (SUTURE)
SUT VIC AB 2-0 CT1 TAPERPNT 27 (SUTURE) IMPLANT
SUT VIC AB 3-0 X1 27 (SUTURE) IMPLANT
SUTURE E-PAK OPEN HEART (SUTURE) ×3 IMPLANT
SYSTEM SAHARA CHEST DRAIN ATS (WOUND CARE) ×3 IMPLANT
TAPE CLOTH SURG 4X10 WHT LF (GAUZE/BANDAGES/DRESSINGS) ×3 IMPLANT
TOWEL OR 17X24 6PK STRL BLUE (TOWEL DISPOSABLE) ×3 IMPLANT
TOWEL OR 17X26 10 PK STRL BLUE (TOWEL DISPOSABLE) ×6 IMPLANT
TRAY FOLEY IC TEMP SENS 14FR (CATHETERS) ×3 IMPLANT
TUBE CONNECTING 12X1/4 (SUCTIONS) ×3 IMPLANT
UNDERPAD 30X30 (UNDERPADS AND DIAPERS) ×3 IMPLANT
VALVE AOR 12X21MECH LO POR (Prosthesis & Implant Heart) ×2 IMPLANT
VALVE AORTIC COND (Prosthesis & Implant Heart) ×1 IMPLANT
VENT LEFT HEART 12002 (CATHETERS) ×3
WATER STERILE IRR 1000ML POUR (IV SOLUTION) ×6 IMPLANT
YANKAUER SUCT BULB TIP NO VENT (SUCTIONS) ×3 IMPLANT

## 2015-12-03 NOTE — Anesthesia Procedure Notes (Signed)
Central Venous Catheter Insertion Performed by: anesthesiologist 12/03/2015 7:22 AM Patient location: Pre-op. Preanesthetic checklist: patient identified, IV checked, site marked, risks and benefits discussed, surgical consent, monitors and equipment checked, pre-op evaluation, timeout performed and anesthesia consent Position: Trendelenburg Lidocaine 1% used for infiltration Landmarks identified and Seldinger technique used Catheter size: 8.5 Fr Central line was placed.Sheath introducer Swan type and PA catheter depth:thermodilation and 50PA Cath depth:50 Procedure performed using ultrasound guided technique. Attempts: 1 Following insertion, line sutured and dressing applied. Post procedure assessment: blood return through all ports, free fluid flow and no air. Patient tolerated the procedure well with no immediate complications.

## 2015-12-03 NOTE — Op Note (Signed)
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 Median Sternotomy 2. Extracorporeal circulation 3.   Replacement of ascending aorta using a 28 mm Hemashield graft (hemi-arch) under deep hypothermic circulatory arrest. 4.   Bentall procedure using a 21 mm St. Jude mechanical valved graft.  Anesthesia:  General Endotracheal   Clinical History/Surgical Indication:  The patient is a 55 year old woman in generally good health with a history of mild mitral regurgitation but no AI by echo in 2011 when she was having SVT and PAC's. She was noted to have a new heart murmur recently on routine followup and a 2D echo on 10/29/2015 showed severe AI with normal LV function and mild MR. A TEE on 9/6 showed a trileaflet aortic valve with severe AI, mild MR, a small PFO and normal LV function. She reports a several month history of fatigue. She has been exercising but gets short of breath quickly. She denies chest pain or pressure and has no peripheral edema.  She has a trileaflet aortic valve with severe AI associated with aneurysmal change of the aortic root and ascending aorta. She had no AI by echo in 2011. 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 The aortic root is 4.3 cm and the mid-ascending aorta is 4.2 cm. She had a CTA of the chest in 2013 which is motion degraded but the ascending aorta measures less than 4 cm. Her current CTA shows that the aorta narrows just proximal to the innominate artery and the arch is fairly normal sized. There is fusiform aneurysmal change in the proximal descending aorta to about 3.4 cm and the distal thoracic aorta is 2.5 cm. She also had a small PFO on TEE. Cardiac cath shows normal coronary arteries. Her echo shows normal LV function with an EF of 60%  and the LV internal dimensions are just at the upper limit of normal. I think replacement of her aortic valve and ascending aorta is indicated to prevent progressive LV deterioration and associated development of congestive heart failure symptoms. This will require a Bentall procedure. I discussed the options for valve replacement including mechanical and tissue valves and the need for lifelong coumadin with a mechanical valve. I discussed the pros and cons of both valve types including the risk of structural valve deterioration with a tissue valve in her age group. She does not have any contraindication to anticoagulation and would like to use a mechanical valve which I think is her best choice at her young age. With annuloaortic ectasia and severe AI I don't think the valve will be suitable for repair. I discussed the operative procedure with the patient and her husbandincluding alternatives, benefits and risks; including but not limited to bleeding, blood transfusion, infection, stroke, myocardial infarction, graft failure, heart block requiring a permanent pacemaker, organ dysfunction, and death. I have answered all of her questions. Marissa Jonesunderstands and agrees to proceed.     Preparation:  The patient was seen in the preoperative holding area and the correct patient, correct operation were confirmed with the patient after reviewing the medical record and catheterization. The consent was signed by me. Preoperative antibiotics were given. A pulmonary arterial line and radial arterial line were placed by the anesthesia team. The patient was taken back to the operating room and positioned  supine on the operating room table. After being placed under general endotracheal anesthesia by the anesthesia team a foley catheter was placed. The neck, chest, abdomen, and both legs were prepped with betadine soap and solution and draped in the usual sterile manner. A surgical time-out was taken and the  correct patient and operative procedure were confirmed with the nursing and anesthesia staff.  TEE: performed by Dr. Marguerita MerlesEdmund Fitzgerald  This showed severe AI with normal LV function. There was trivial MR. There was a tiny PFO with minimal left to right shunt by color doppler. Bubble study done and no bubbles made it to left side. After review of the echo I did not feel that closure of this tiny PFO was worth the risk in this patient.   Cardiopulmonary Bypass:  A median sternotomy was performed. The pericardium was opened in the midline. Right ventricular function appeared normal. The ascending aorta was aneurysmal and there was dense adhesions between the aorta and the SVC. I decided not to use retrograde cerebral perfusion due to these adhesions since the aorta was thin and I was concerned about injuring it while trying to separate it from the SVC.  There were no contraindications to aortic cannulation. The patient was fully systemically heparinized and the ACT was maintained > 400 sec. The mid-ascending aorta was cannulated with a 20 F aortic cannula for arterial inflow. Venous cannulation was performed via the right atrial appendage using a two-staged venous cannula.  A temperature probe was inserted into the interventricular septum and an insulating pad was placed in the pericardium. CO2 was insufflated into the pericardium throughout the case to minimize intracardiac air.   Resection and grafting of ascending aortic aneurysm:  The patient was placed on cardiopulmonary bypass and a left ventricular vent was placed via the right superior pulmonary vein. Systemic cooling was begun with a goal temperature of 18 degrees centigrade by bladder and rectal temperature probes. A retrograde cardioplegia cannula was placed through the right atrium into the coronary sinus without difficulty.  After 30 minutes of cooling the target temperature of 18 degrees centigrade was reached. Cerebral oximetry was 70%  bilaterally. BIS was zero. The patient was given 200 mg Propofol and 250 mg of Solucortef per anesthesia. The head was packed in ice. The bed was placed in steep trendelenburg. Circulatory arrest was begun and the blood volume emptied into the venous reservoir.  Cold blood retrograde cardioplegia was given and myocardial temperature dropped to 10 degrees centigrade. Additional doses were given at approximately 20 minute intervals throughout the period of circulatory arrest and cross-clamping. Complete diastolic arrest was maintained. The aortic cannula was removed. The aorta was transected just proximal to the innominate artery beveling the resection out along the undersurface of the aortic arch (Hemiarch replacement). The aortic diameter was measured at 28 mm here. A 28 x 10 mm Hemasheild Platinum vascular graft was prepared. ( Catalog # [id number], Lot # [id number], SN [id number]). It was anastomosed to the aortic arch in an end to end manner using 3-0 prolene continuous suture with a felt strip to reinforce the anastomisis. A light coating of CoSeal was applied to seal needle holes. The arterial end of the bypass circuit was then connected to the 10mm side arm graft and circulation was slowly resumed. The aortic graft was cross-clamped proximal to the side arm graft and full CPB support was resumed. Circulatory arrest time was 20 minutes.   Bentall Procedure:   The ascending aorta was mobilized from the right  pulmonary artery and main PA. It was opened longitudinally and the valve inspected. It was a trileaflet valve with a few calcifications on the leaflets and poor coaptation due to aneurysmal enlargement of the left and non-coronary sinuses distorting the leaflets. This appeared to be an atherosclerotic aneurysm with thickening of the walls of the sinuses and plaque present. The right and left coronary arteries were removed from the aortic root with a button of aortic wall around the ostia. They were  retracted carefully out of the way with stay sutures to prevent rotation. The native valve was excised taking care to remove all particulate debri. The annulus was sized and a 21 mm St. Jude Mechanical Valved Graft was chosen. ( Ref # [id number], Serial # [id number]) A series of pledgetted 2-0 Ethibond horizontal mattress sutures were placed around the annulus with the pledgets in a sub-annular position. The sutures were placed through valve sewing ring. The valve was lowered into place and the sutures at the hinge posts tied first followed by the remaining sutures. The valve seated nicely. The discs moved normally. Small openings were made in the graft for the coronary anastomoses using a thermal cautery. Then the left and right coronary buttons were anastomosed to the graft in an end to side manner using continuous 5-0 prolene suture. A light coating of CoSeal was applied to each anastomosis for hemostasis. The two grafts were then cut to the appropriate length and anastomosed end to end using continuous 3-0 prolene suture. CoSeal was applied to seal the needle holes in the grafts. A vent cannula was placed into the graft to remove any air. Deairing maneuvers were performed and the bed placed in trendelenburg position.   Completion:  The patient was rewarmed to 37 degrees Centigrade. The crossclamp was removed with a time of 114 minutes. There was spontaneous return of sinus rhythm. The distal and proximal anastomoses were checked for hemostasis. The position of the grafts was satisfactory. The vascular anastomoses all appeared hemostatic. Two temporary epicardial pacing wires were placed on the right atrium and two on the right ventricle. The patient was weaned from CPB without difficulty on no inotropes. CPB time was 186 minutes. Cardiac output was 5 LPM. TEE showed a normal functioning mechanical valve. LV function was normal. Heparin was fully reversed with protamine and the aortic and venous cannulas  removed. The aortic side arm graft was ligated with a heavy silk tie and a 3-0 prolene pledgetted mattress suture.  The coagulation profile showed a low fibrinogen of 150 and platelet count of 80 K. Therefore the patient was given a unit of platelets and 2 packs of cryoprecipitate. Hemostasis was achieved. Mediastinal drainage tubes were placed. The sternum was closed with  #6 stainless steel wires. The fascia was closed with continuous # 1 vicryl suture. The subcutaneous tissue was closed with 2-0 vicryl continuous suture. The skin was closed with 3-0 vicryl subcuticular suture. All sponge, needle, and instrument counts were reported correct at the end of the case. Dry sterile dressings were placed over the incisions and around the chest tubes which were connected to pleurevac suction. The patient was then transported to the surgical intensive care unit in critical but stable condition.

## 2015-12-03 NOTE — Interval H&P Note (Signed)
History and Physical Interval Note:  12/03/2015 5:39 AM  Marissa Bailey  has presented today for surgery, with the diagnosis of AI PFO  The various methods of treatment have been discussed with the patient and family. After consideration of risks, benefits and other options for treatment, the patient has consented to  Procedure(s) with comments: BENTALL PROCEDURE (N/A) - CIRC ARREST CLOSURE OF PATENT FORAMEN OVALE (N/A) TRANSESOPHAGEAL ECHOCARDIOGRAM (TEE) (N/A) as a surgical intervention .  The patient's history has been reviewed, patient examined, no change in status, stable for surgery.  I have reviewed the patient's chart and labs.  Questions were answered to the patient's satisfaction.     Alleen BorneBryan K Hawthorne Day

## 2015-12-03 NOTE — Anesthesia Postprocedure Evaluation (Signed)
Anesthesia Post Note  Patient: Marissa Bailey  Procedure(s) Performed: Procedure(s) (LRB): BENTALL PROCEDURE USING 21MM ST. JUDE AORTIC VALVE CONDUIT (N/A) TRANSESOPHAGEAL ECHOCARDIOGRAM (TEE) (N/A) REPLACEMENT ASCENDING AORTA USING 28MM HEMASHIELD GRAFT (N/A)  Patient location during evaluation: SICU Anesthesia Type: General Level of consciousness: sedated Pain management: pain level controlled Vital Signs Assessment: post-procedure vital signs reviewed and stable Respiratory status: patient remains intubated per anesthesia plan Cardiovascular status: stable Anesthetic complications: no    Last Vitals:  Vitals:   12/03/15 1430 12/03/15 1445  BP: (!) 84/57 126/84  Pulse: 74 75  Resp: 12 12  Temp: (!) 35.7 C (!) 35.9 C    Last Pain:  Vitals:   12/03/15 0611  TempSrc: Oral                 Cruz Bong,W. EDMOND

## 2015-12-03 NOTE — Anesthesia Procedure Notes (Signed)
Central Venous Catheter Insertion Performed by: anesthesiologist 12/03/2015 7:23 AM Patient location: Pre-op. Preanesthetic checklist: patient identified, IV checked, site marked, risks and benefits discussed, surgical consent, monitors and equipment checked, pre-op evaluation, timeout performed and anesthesia consent Landmarks identified PA cath was placed.Swan type and PA catheter depth:thermodilationProcedure performed using ultrasound guided technique. Attempts: 1 Patient tolerated the procedure well with no immediate complications.

## 2015-12-03 NOTE — Transfer of Care (Signed)
Immediate Anesthesia Transfer of Care Note  Patient: Marissa Bailey  Procedure(s) Performed: Procedure(s) with comments: BENTALL PROCEDURE USING 21MM ST. JUDE AORTIC VALVE CONDUIT (N/A) - CIRC ARREST TRANSESOPHAGEAL ECHOCARDIOGRAM (TEE) (N/A) REPLACEMENT ASCENDING AORTA USING 28MM HEMASHIELD GRAFT (N/A)  Patient Location: ICU  Anesthesia Type:General  Level of Consciousness: unresponsive and Patient remains intubated per anesthesia plan  Airway & Oxygen Therapy: Patient remains intubated per anesthesia plan and Patient placed on Ventilator (see vital sign flow sheet for setting)  Post-op Assessment: Report given to RN  Post vital signs: Reviewed and stable  Last Vitals:  Vitals:   12/03/15 0611 12/03/15 0613  BP:  (!) 162/35  Pulse: 75   Resp: 18   Temp: 37 C     Last Pain:  Vitals:   12/03/15 0611  TempSrc: Oral         Complications: No apparent anesthesia complications

## 2015-12-03 NOTE — Progress Notes (Signed)
Patient ID: [name], female   DOB: 04/04/1960, 55 y.o.   MRN: [id number] EVENING ROUNDS NOTE :     [address]       [place] [id number]             [phone]                 Day of Surgery Procedure(s) (LRB): BENTALL PROCEDURE USING 21MM ST. JUDE AORTIC VALVE CONDUIT (N/A) TRANSESOPHAGEAL ECHOCARDIOGRAM (TEE) (N/A) REPLACEMENT ASCENDING AORTA USING 28MM HEMASHIELD GRAFT (N/A)  Total Length of Stay:  LOS: 0 days  BP 110/62   Pulse 72   Temp 97.7 F (36.5 C)   Resp (!) 21   SpO2 98%   .Intake/Output      10/02 0701 - 10/03 0700   I.V. 2816   Blood 1387   IV Piggyback 850   Total Intake 5053   Urine 2375   Emesis/NG output 100   Blood 1635   Chest Tube 150   Total Output 4260   Net +793         . sodium chloride 20 mL/hr at 12/03/15 1400  . [START ON 12/04/2015] sodium chloride    . sodium chloride 20 mL/hr at 12/03/15 1400  . dexmedetomidine 0.1 mcg/kg/hr (12/03/15 1600)  . insulin (NOVOLIN-R) infusion Stopped (12/03/15 1630)  . lactated ringers    . lactated ringers 20 mL/hr at 12/03/15 1400  . nitroGLYCERIN Stopped (12/03/15 1600)  . phenylephrine (NEO-SYNEPHRINE) Adult infusion Stopped (12/03/15 1600)     Lab Results  Component Value Date   WBC 9.7 12/03/2015   HGB 11.9 (L) 12/03/2015   HCT 35.0 (L) 12/03/2015   PLT 93 (L) 12/03/2015   GLUCOSE 163 (H) 12/03/2015   CHOL 177 09/18/2009   TRIG 109.0 09/18/2009   HDL 76.50 09/18/2009   LDLCALC 79 09/18/2009   ALT 23 11/30/2015   AST 32 11/30/2015   NA 141 12/03/2015   K 4.6 12/03/2015   CL 108 12/03/2015   CREATININE 0.70 12/03/2015   BUN 11 12/03/2015   CO2 21 (L) 11/30/2015   TSH 0.09 (L) 01/08/2012   INR 1.36 12/03/2015   HGBA1C 5.0 11/30/2015   Surgery today  Not bleeding  Neuro intact  Delight OvensEdward B Renee Erb MD  Beeper [phone] Office [phone] 12/03/2015 8:34 PM

## 2015-12-03 NOTE — Anesthesia Procedure Notes (Addendum)
Procedure Name: Intubation Date/Time: 12/03/2015 7:56 AM Performed by: Charm BargesBUTLER, Griselle Rufer R Pre-anesthesia Checklist: Patient identified, Emergency Drugs available, Suction available and Patient being monitored Patient Re-evaluated:Patient Re-evaluated prior to inductionOxygen Delivery Method: Circle System Utilized Preoxygenation: Pre-oxygenation with 100% oxygen Intubation Type: IV induction Ventilation: Mask ventilation without difficulty Laryngoscope Size: Mac and 3 Grade View: Grade III Tube type: Oral Tube size: 8.0 mm Number of attempts: 3 (Esophagus intubated X 2 with immediate recognition and removal of ETT, Bougie Stylet inserted above arytenoids and ETT advanced easily over stylet into trachea.) Airway Equipment and Method: Stylet and Bougie stylet Placement Confirmation: ETT inserted through vocal cords under direct vision,  positive ETCO2 and breath sounds checked- equal and bilateral Secured at: 22 cm Tube secured with: Tape Dental Injury: Teeth and Oropharynx as per pre-operative assessment  Difficulty Due To: Difficulty was unanticipated and Difficult Airway- due to anterior larynx Future Recommendations: Recommend- induction with short-acting agent, and alternative techniques readily available

## 2015-12-03 NOTE — Brief Op Note (Addendum)
12/03/2015  12:56 PM  PATIENT:  Marissa Bailey  55 y.o. female  PRE-OPERATIVE DIAGNOSIS:  1. SEVERE AORTIC INSUFFICIENCY 2. ASCENDING THORACIC AORTIC ANEURYSM 3. PATENT FORAMEN OVALE  POST-OPERATIVE DIAGNOSIS:  1. SEVERE AORTIC INSUFFICIENCY 2. ASCENDING THORACIC AORTIC ANEURYSM 3. PATENT FORAMEN OVALE   PROCEDURE:  TRANSESOPHAGEAL ECHOCARDIOGRAM (TEE), MEDIAN STERNOTOMY for BENTALL PROCEDURE USING 28MM X 10MM HEMASHIELD GRAFT AND 21MM ST JUDE AORTIC VALVE CONDUIT GRAFT, CIRC ARREST  SURGEON:  Surgeon(s) and Role:    * Alleen BorneBryan K Bartle, MD - Primary  PHYSICIAN ASSISTANT: Doree Fudgeonielle Zimmerman PA-C  ANESTHESIA:   general  EBL:  Total I/O In: -  Out: 1350 [Urine:1350]  BLOOD ADMINISTERED:One CC PRBC, two cryo,  and one PLTS  DRAINS: Chest tubes placed in the mediastinal and pleural spaces   SPECIMEN:  Source of Specimen:  Ascending thoracic aneurysm and native AV leaflets  DISPOSITION OF SPECIMEN:  PATHOLOGY  COUNTS CORRECT:  YES  DICTATION: .Dragon Dictation  PLAN OF CARE: Admit to inpatient   PATIENT DISPOSITION:  ICU - intubated and hemodynamically stable.   Delay start of Pharmacological VTE agent (>24hrs) due to surgical blood loss or risk of bleeding: yes  BASELINE WEIGHT: 70 kg

## 2015-12-03 NOTE — Procedures (Signed)
Extubation Procedure Note  Patient Details:   Name: [name] DOB: 12-04-1960 MRN: [id number]   Airway Documentation:     Evaluation  O2 sats: stable throughout and currently acceptable Complications: No apparent complications Patient did tolerate procedure well. Bilateral Breath Sounds: Clear   Yes   NIF >-20, VC 714  Antoine Pocherogdon, Josely Moffat Caroline 12/03/2015, 6:01 PM

## 2015-12-03 NOTE — H&P (Signed)
[address]       Mar[place] Bailey [address]             [phone]      Cardiothoracic Surgery History and Physical  PCP is Lorretta Harp, MD Referring Provider is Kathleene Hazel*      Chief Complaint  Patient presents with  . Aortic Insuffiency    TEE 11/07/15, CATH 11/15/15, CTA CHEST 11/20/15    HPI:  The patient is a 55 year old woman in generally good health with a history of mild mitral regurgitation but no AI by echo in 2011 when she was having SVT and PAC's. She was noted to have a new heart murmur recently on routine followup and a 2D echo on 10/29/2015 showed severe AI with normal LV function and mild MR. A TEE on 9/6 showed a trileaflet aortic valve with severe AI, mild MR, a small PFO and normal LV function. She reports a several month history of fatigue. She has been exercising but gets short of breath quickly. She denies chest pain or pressure and has no peripheral edema.      Past Medical History:  Diagnosis Date  . Anxiety   . Heart palpitations   . Migraines    menstrual  . Seasonal allergies          Past Surgical History:  Procedure Laterality Date  . CARDIAC CATHETERIZATION N/A 11/15/2015   Procedure: Right/Left Heart Cath and Coronary Angiography;  Surgeon: Kathleene Hazel, MD;  Location: [facility] INVASIVE CV LAB;  Service: Cardiovascular;  Laterality: N/A;  . EXPLORATORY LAPAROTOMY     for tubal pregnancy  . TEE WITHOUT CARDIOVERSION N/A 11/07/2015   Procedure: TRANSESOPHAGEAL ECHOCARDIOGRAM (TEE);  Surgeon: Chrystie Nose, MD;  Location: [facility] ENDOSCOPY;  Service: Cardiovascular;  Laterality: N/A;  . vein surgery lower extremity            Family History  Problem Relation Age of Onset  . Heart attack Mother 6    brain impairment tobacco dm  . Diabetes Mother   . Hyperlipidemia Mother   . Other Sister     whole in heart age 85  . Cancer Paternal Grandmother     breast  . Stroke  Paternal Grandmother   brother had cardiac arrest in his 25's last year and was resuscitated. Told that it was a primary arrhythmia.   No family history of aneurysm disease.  Social History        Social History   Substance Use Topics   . Smoking status: Never Smoker   . Smokeless tobacco: Never Used   . Alcohol use Yes     Comment: socially           Current Outpatient Prescriptions  Medication Sig Dispense Refill  . aspirin 81 MG tablet Take 81 mg by mouth at bedtime.     . Cholecalciferol (VITAMIN D) 1000 UNITS capsule Take 2,000 Units by mouth daily.     . Echinacea 400 MG CAPS Take 1 capsule by mouth daily.     . fish oil-omega-3 fatty acids 1000 MG capsule Take 2 g by mouth daily.      Marland Kitchen glucosamine-chondroitin 500-400 MG tablet Take 1 tablet by mouth daily.    Marland Kitchen MAGNESIUM CARBONATE PO Take 1 tablet by mouth daily.     . Multiple Vitamin (MULTIVITAMIN WITH MINERALS) TABS Take 1 tablet by mouth daily.    . NON FORMULARY Apply 0.5 mLs topically  daily. 70/30 mix of estriol and estradiol in a cream base. Compounded by [facility].    Marland Kitchen PRESCRIPTION MEDICATION Apply 0.1 mLs topically daily. Testosterone 2% cream compounded    . progesterone (PROMETRIUM) 100 MG capsule Take 100 mg by mouth at bedtime.    . vitamin B-12 (CYANOCOBALAMIN) 1000 MCG tablet Take 1,000 mcg by mouth daily.    . vitamin C (ASCORBIC ACID) 500 MG tablet Take 1,000 mg by mouth daily.     Marland Kitchen liotrix (THYROLAR-1) 60 (12.5-50) MG (MCG) TABS tablet Take 1 tablet by mouth daily.     No current facility-administered medications for this visit.     No Known Allergies  Review of Systems  Constitutional: Positive for activity change and fatigue.  HENT: Negative.        Saw her dentist in 10/2015  Eyes: Negative.   Respiratory: Negative.   Cardiovascular: Positive for palpitations. Negative for chest pain and leg swelling.  Gastrointestinal: Negative.   Endocrine:  Negative.   Genitourinary: Negative.   Musculoskeletal: Positive for myalgias.  Skin: Negative.   Allergic/Immunologic: Negative.   Neurological: Negative.   Hematological: Negative.   Psychiatric/Behavioral: Negative.     BP (!) 145/50   Pulse 71   Resp 16   Ht 5\' 3"  (1.6 m)   Wt 152 lb (68.9 kg)   SpO2 98% Comment: ON RA  BMI 26.93 kg/m  Physical Exam  Constitutional: She is oriented to person, place, and time. She appears well-developed and well-nourished. No distress.  HENT:  Head: Normocephalic and atraumatic.  Mouth/Throat: Oropharynx is clear and moist.  Eyes: EOM are normal. Pupils are equal, round, and reactive to light.  Neck: Normal range of motion. Neck supple. No JVD present. No tracheal deviation present. No thyromegaly present.  Cardiovascular: Normal rate, regular rhythm and intact distal pulses.   Murmur heard. 2/6 systolic murmur and 3/6 diastolic murmur heard throughout precordium.  Pulmonary/Chest: Effort normal and breath sounds normal. No respiratory distress. She has no rales.  Abdominal: Soft. Bowel sounds are normal. She exhibits no distension and no mass. There is no tenderness.  Musculoskeletal: Normal range of motion. She exhibits no edema.  Lymphadenopathy:    She has no cervical adenopathy.  Neurological: She is alert and oriented to person, place, and time. No cranial nerve deficit or sensory deficit.  Skin: Skin is warm and dry.  Psychiatric: She has a normal mood and affect.     Diagnostic Tests:  Result status: Final result   *[place]* *[facility]* [address] [place], [place] [address] [phone]  ------------------------------------------------------------------- Transesophageal Echocardiography  Patient: Marissa Bailey, Marissa Bailey MR #: [id number] Study Date: 11/07/2015 Gender:  F Age: 77 Height: 160 cm Weight: 69.4 kg BSA: 1.77 m^2 Pt. Status: Room:  Rondall Allegra REFERRING Jacolyn Reedy M ADMITTING Zoila Shutter MD ATTENDING Zoila Shutter MD PERFORMING Zoila Shutter MD SONOGRAPHER Jeryl Columbia  cc:  ------------------------------------------------------------------- LV EF: 55% - 60%  ------------------------------------------------------------------- Indications: Aortic insufficiency 424.1.  ------------------------------------------------------------------- History: PMH: Aortic Insufficiency. Palpitations.  ------------------------------------------------------------------- Study Conclusions  - Left ventricle: The cavity size was normal. Wall thickness was normal. Systolic function was normal. The estimated ejection fraction was in the range of 55% to 60%. Wall motion was normal; there were no regional wall motion abnormalities. - Aortic valve: Dilated sinotubular junction to 4.2 cm. Trileaflet valve. Severe regurgitation. Vena contracta measures 1.1 cm. Regurgitation pressure half-time: 253 ms. - Mitral valve: There was mild regurgitation. - Left atrium: No  evidence of thrombus in the atrial cavity or appendage. - Right atrium: No evidence of thrombus in the atrial cavity or appendage. - Atrial septum: There was a patent foramen ovale. Left to right, but not right to left shunting. There is a type 1R atrial septal aneurysm. - Pulmonic valve: No evidence of vegetation.  Impressions:  - Likely severe aortic stenosis. The preferred modality to evaluate regurgitant volume is PISA analysis by transthoracic echocardiogram. That being said, this TEE study does demonstrate a dilated sinotubular junction with a normal trileaflet structure. Evaluation for aortic valve surgery may be  indicated.  ------------------------------------------------------------------- Study data: Study status: Routine. Consent: The risks, benefits, and alternatives to the procedure were explained to the patient and informed consent was obtained. Procedure: Initial setup. The patient was brought to the laboratory. Surface ECG leads were monitored. Sedation. Conscious sedation was administered. Transesophageal echocardiography. Topical anesthesia was obtained using viscous lidocaine. A transesophageal probe was inserted by the attending cardiologist. Image quality was adequate. Study completion: The patient tolerated the procedure well. There were no complications. Administered medications: Midazolam, 7mg . Fentanyl, 100mcg. Diagnostic transesophageal echocardiography. 2D and color Doppler. Birthdate: Patient birthdate: Oct 10, 1960. Age: Patient is 55 yr old. Sex: Gender: female. BMI: 27.1 kg/m^2. Blood pressure: 142/39 Patient status: Outpatient. Study date: Study date: 11/07/2015. Study time: 08:59 AM. Location: Endoscopy.  -------------------------------------------------------------------  ------------------------------------------------------------------- Left ventricle: The cavity size was normal. Wall thickness was normal. Systolic function was normal. The estimated ejection fraction was in the range of 55% to 60%. Wall motion was normal; there were no regional wall motion abnormalities.  ------------------------------------------------------------------- Aortic valve: Dilated sinotubular junction to 4.2 cm. Trileaflet valve. Severe regurgitation. Vena contracta measures 1.1 cm.  ------------------------------------------------------------------- Mitral valve: Doppler: There was mild regurgitation.  ------------------------------------------------------------------- Left atrium: The atrium was normal in size. No evidence  of thrombus in the atrial cavity or appendage.  ------------------------------------------------------------------- Atrial septum: There was a patent foramen ovale. Left to right, but not right to left shunting. There is a type 1R atrial septal aneurysm.  ------------------------------------------------------------------- Pulmonic valve: Structurally normal valve. Cusp separation was normal. No evidence of vegetation.  ------------------------------------------------------------------- Tricuspid valve: Doppler: There was trivial regurgitation.  ------------------------------------------------------------------- Pulmonary artery: The main pulmonary artery was normal-sized.  ------------------------------------------------------------------- Right atrium: The atrium was normal in size. No evidence of thrombus in the atrial cavity or appendage.  ------------------------------------------------------------------- Pericardium: There was no pericardial effusion.  ------------------------------------------------------------------- Measurements  Aortic valve Value Aortic regurg pressure half-time 253 ms  Legend: (L) and (H) mark values outside specified reference range.  ------------------------------------------------------------------- Prepared and Electronically Authenticated by  Zoila ShutterKenneth Hilty MD 2017-09-06T13:31:49    Physicians   Panel Physicians Referring Physician Case Authorizing Physician  Kathleene Hazelhristopher D McAlhany, MD (Primary)    Procedures   Right/Left Heart Cath and Coronary Angiography  Conclusion   1. No angiographic evidence of CAD 2. Normal filling pressures  Recommendations: Will continue with planning for AVR. Surgical consultation and Chest CTA to size aortic root are being planned.   Indications   Aortic insufficiency [I35.1 (ICD-10-CM)]  Procedural Details/Technique    Technical Details Indication: 55 yo female with severe aortic valve insufficiency. Cardiac cath in preparation for surgical AVR.   Procedure: The risks, benefits, complications, treatment options, and expected outcomes were discussed with the patient. The patient and/or family concurred with the proposed plan, giving informed consent. The patient was brought to the cath lab after IV hydration was begun and oral premedication was given. The patient was further sedated with Versed and Fentanyl. There was  an IV catheter present in the right antecubital area in the brachial vein. I changed this catheter out for a 5 French sheath. A balloon tipped catheter was used to perform a right heart catheterization. The right wrist was prepped and draped in a sterile fashion. 1% lidocaine was used for local anesthesia. Using the modified Seldinger access technique, a 5 French sheath was placed in the right radial artery. 3 mg Verapamil was given through the sheath. 4000 units IV heparin was given. Standard diagnostic catheters were used to perform selective coronary angiography. A pigtail catheter was used to perform a left ventricular angiogram. The sheath was removed from the right radial artery and a Terumo hemostasis band was applied at the arteriotomy site on the right wrist.     Estimated blood loss <50 mL. . During this procedure the patient was administered the following to achieve and maintain moderate conscious sedation: Versed 3 mg, Fentanyl 75 mcg, while the patient's heart rate, blood pressure, and oxygen saturation were continuously monitored. The period of conscious sedation was 24 minutes, of which I was present face-to-face 100% of this time.    Complications   Complications documented before study signed (11/15/2015 12:25 PM EDT)   RIGHT/LEFT HEART CATH AND CORONARY ANGIOGRAPHY   None Documented by Kathleene Hazel, MD 11/15/2015 12:23 PM EDT  Time Range: Intra-procedure      Coronary  Findings   Dominance: Right  Left Anterior Descending  Vessel is large. Vessel is angiographically normal.  First Diagonal Branch  Vessel is moderate in size.  Second Diagonal Branch  Vessel is small in size.  Third Diagonal Branch  Vessel is small in size.  Left Circumflex  Vessel is large. Vessel is angiographically normal.  Second Obtuse Marginal Branch  Vessel is large in size. Vessel is angiographically normal.  Right Coronary Artery  Vessel is large. Vessel is angiographically normal.  Right Posterior Descending Artery  Vessel is moderate in size.  Right Posterior Atrioventricular Branch  Vessel is moderate in size.  Coronary Diagrams   Diagnostic Diagram     Implants        No implant documentation for this case.  PACS Images   Show images for Cardiac catheterization   Link to Procedure Log   Procedure Log    Hemo Data   Flowsheet Row Most Recent Value  Fick Cardiac Output 4.9 L/min  Fick Cardiac Output Index 2.85 (L/min)/BSA  RA A Wave 9 mmHg  RA V Wave 7 mmHg  RA Mean 5 mmHg  RV Systolic Pressure 31 mmHg  RV Diastolic Pressure 1 mmHg  RV EDP 8 mmHg  PA Systolic Pressure 28 mmHg  PA Diastolic Pressure 11 mmHg  PA Mean 19 mmHg  PW A Wave 15 mmHg  PW V Wave 14 mmHg  PW Mean 10 mmHg  AO Systolic Pressure 136 mmHg  AO Diastolic Pressure 38 mmHg  AO Mean 78 mmHg  LV Systolic Pressure 133 mmHg  LV Diastolic Pressure 7 mmHg  LV EDP 17 mmHg  Arterial Occlusion Pressure Extended Systolic Pressure 129 mmHg  Arterial Occlusion Pressure Extended Diastolic Pressure 36 mmHg  Arterial Occlusion Pressure Extended Mean Pressure 74 mmHg  Left Ventricular Apex Extended Systolic Pressure 132 mmHg  Left Ventricular Apex Extended Diastolic Pressure 6 mmHg  Left Ventricular Apex Extended EDP Pressure 15 mmHg  QP/QS 1  TPVR Index 6.66 HRUI  TSVR Index 27.35 HRUI  PVR SVR Ratio 0.12  TPVR/TSVR Ratio 0.24   CLINICAL DATA: Aortic  insufficiency.  EXAM: CT ANGIOGRAPHY CHEST WITH CONTRAST  TECHNIQUE: Multidetector CT imaging of the chest was performed using the standard protocol during bolus administration of intravenous contrast. Multiplanar CT image reconstructions and MIPs were obtained to evaluate the vascular anatomy.  CONTRAST: 100 mL of Isovue 370 intravenously.  COMPARISON: CT scan of November 04, 2011.  FINDINGS: Cardiovascular: Aortic root is dilated at 4.3 cm in diameter. 4.2 cm ascending thoracic aortic aneurysm is noted. Transverse aortic arch measures 2.1 cm in diameter. Proximal portion of descending thoracic aorta measures 3.4 cm consistent with mild dilatation. Distal portion of descending thoracic aorta measures 2.5 cm. Great vessels are widely patent with mild stenosis noted at the origin of the left common carotid artery.  Mediastinum/Nodes: No mediastinal mass or adenopathy is noted.  Lungs/Pleura: No pneumothorax or pleural effusion is noted. No acute pulmonary disease is noted.  Upper Abdomen: No definite abnormality seen.  Musculoskeletal: No significant osseous abnormality is noted.  Review of the MIP images confirms the above findings.  IMPRESSION: Aortic root dilatation is noted at 4.3 cm. Dilatation of proximal portion of descending thoracic aorta is noted at 3.4 cm.  4.2 cm ascending thoracic aortic aneurysm is noted. Recommend annual imaging followup by CTA or MRA. This recommendation follows 2010 ACCF/AHA/AATS/ACR/ASA/SCA/SCAI/SIR/STS/SVM Guidelines for the Diagnosis and Management of Patients with Thoracic Aortic Disease. Circulation. 2010; 121: Z610-R604.   Electronically Signed By: Lupita Raider, M.D. On: 11/20/2015 10:47   Impression:  I have personally reviewed and interpreted her 2D echo, TEE, cath films and CTA of the chest. She has a trileaflet aortic valve with severe AI associated with aneurysmal change of the aortic root and  ascending aorta. She had no AI by echo in 2011. 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 The aortic root is 4.3 cm and the mid-ascending aorta is 4.2 cm. She had a CTA of the chest in 2013 which is motion degraded but the ascending aorta measures less than 4 cm. Her current CTA shows that the aorta narrows just proximal to the innominate artery and the arch is fairly normal sized. There is fusiform aneurysmal change in the proximal descending aorta to about 3.4 cm and the distal thoracic aorta is 2.5 cm. She also had a small PFO on TEE. Cardiac cath shows normal coronary arteries. Her echo shows normal LV function with an EF of 60% and the LV internal dimensions are just at the upper limit of normal. I think replacement of her aortic valve and ascending aorta is indicated to prevent progressive LV deterioration and associated development of congestive heart failure symptoms. This will require a Bentall procedure. I discussed the options for valve replacement including mechanical and tissue valves and the need for lifelong coumadin with a mechanical valve. I discussed the pros and cons of both valve types including the risk of structural valve deterioration with a tissue valve in her age group. She does not have any contraindication to anticoagulation and would like to use a mechanical valve which I think is her best choice at her young age. With annuloaortic ectasia and severe AI I don't think the valve will be suitable for repair. I discussed the operative procedure with the patient and her husband including alternatives, benefits and risks; including but not limited to bleeding, blood transfusion, infection, stroke, myocardial infarction, graft failure, heart block requiring a permanent pacemaker, organ dysfunction, and death. I have answered all of her questions.  Pura Spice understands and agrees to proceed.  Plan:  Bentall procedure using a mechanical valve conduit and closure of patent foramen  ovale.  Alleen Borne, MD [facility] [phone]

## 2015-12-04 ENCOUNTER — Encounter (HOSPITAL_COMMUNITY): Payer: Self-pay | Admitting: Surgery

## 2015-12-04 ENCOUNTER — Inpatient Hospital Stay (HOSPITAL_COMMUNITY): Payer: BLUE CROSS/BLUE SHIELD

## 2015-12-04 LAB — TYPE AND SCREEN
ABO/RH(D): A POS
Antibody Screen: POSITIVE
DAT, IGG: NEGATIVE
DONOR AG TYPE: NEGATIVE
Donor AG Type: NEGATIVE
PT AG TYPE: NEGATIVE
UNIT DIVISION: 0
UNIT DIVISION: 0

## 2015-12-04 LAB — BASIC METABOLIC PANEL
ANION GAP: 5 (ref 5–15)
BUN: 8 mg/dL (ref 6–20)
CALCIUM: 7.4 mg/dL — AB (ref 8.9–10.3)
CO2: 23 mmol/L (ref 22–32)
Chloride: 110 mmol/L (ref 101–111)
Creatinine, Ser: 0.74 mg/dL (ref 0.44–1.00)
Glucose, Bld: 129 mg/dL — ABNORMAL HIGH (ref 65–99)
POTASSIUM: 4.4 mmol/L (ref 3.5–5.1)
SODIUM: 138 mmol/L (ref 135–145)

## 2015-12-04 LAB — POCT I-STAT, CHEM 8
BUN: 13 mg/dL (ref 6–20)
CALCIUM ION: 1.09 mmol/L — AB (ref 1.15–1.40)
CHLORIDE: 97 mmol/L — AB (ref 101–111)
Creatinine, Ser: 1 mg/dL (ref 0.44–1.00)
GLUCOSE: 143 mg/dL — AB (ref 65–99)
HCT: 37 % (ref 36.0–46.0)
Hemoglobin: 12.6 g/dL (ref 12.0–15.0)
Potassium: 4.2 mmol/L (ref 3.5–5.1)
SODIUM: 136 mmol/L (ref 135–145)
TCO2: 27 mmol/L (ref 0–100)

## 2015-12-04 LAB — GLUCOSE, CAPILLARY
GLUCOSE-CAPILLARY: 126 mg/dL — AB (ref 65–99)
GLUCOSE-CAPILLARY: 172 mg/dL — AB (ref 65–99)
Glucose-Capillary: 110 mg/dL — ABNORMAL HIGH (ref 65–99)
Glucose-Capillary: 160 mg/dL — ABNORMAL HIGH (ref 65–99)

## 2015-12-04 LAB — CBC
HCT: 38.2 % (ref 36.0–46.0)
HCT: 38.3 % (ref 36.0–46.0)
Hemoglobin: 12.6 g/dL (ref 12.0–15.0)
Hemoglobin: 12.5 g/dL (ref 12.0–15.0)
MCH: 30.1 pg (ref 26.0–34.0)
MCH: 30.1 pg (ref 26.0–34.0)
MCHC: 33 g/dL (ref 30.0–36.0)
MCHC: 32.6 g/dL (ref 30.0–36.0)
MCV: 91.4 fL (ref 78.0–100.0)
MCV: 92.3 fL (ref 78.0–100.0)
PLATELETS: 86 10*3/uL — AB (ref 150–400)
PLATELETS: 104 10*3/uL — AB (ref 150–400)
RBC: 4.18 MIL/uL (ref 3.87–5.11)
RBC: 4.15 MIL/uL (ref 3.87–5.11)
RDW: 13.7 % (ref 11.5–15.5)
RDW: 13.8 % (ref 11.5–15.5)
WBC: 12.4 10*3/uL — AB (ref 4.0–10.5)
WBC: 12.6 10*3/uL — AB (ref 4.0–10.5)

## 2015-12-04 LAB — PREPARE CRYOPRECIPITATE
UNIT DIVISION: 0
Unit division: 0

## 2015-12-04 LAB — PREPARE PLATELET PHERESIS: Unit division: 0

## 2015-12-04 LAB — CREATININE, SERUM: CREATININE: 0.96 mg/dL (ref 0.44–1.00)

## 2015-12-04 LAB — MAGNESIUM
MAGNESIUM: 2.5 mg/dL — AB (ref 1.7–2.4)
MAGNESIUM: 2.2 mg/dL (ref 1.7–2.4)

## 2015-12-04 MED ORDER — WARFARIN - PHYSICIAN DOSING INPATIENT: Freq: Every day | Status: DC

## 2015-12-04 MED ORDER — FUROSEMIDE 10 MG/ML IJ SOLN
40.0000 mg | Freq: Two times a day (BID) | INTRAMUSCULAR | Status: AC
  Administered 2015-12-04 (×2): 40 mg via INTRAVENOUS
  Filled 2015-12-04: qty 4

## 2015-12-04 MED ORDER — WARFARIN SODIUM 2.5 MG PO TABS
2.5000 mg | ORAL_TABLET | Freq: Every day | ORAL | Status: DC
  Administered 2015-12-04 – 2015-12-05 (×2): 2.5 mg via ORAL
  Filled 2015-12-04 (×2): qty 1

## 2015-12-04 MED ORDER — METOCLOPRAMIDE HCL 5 MG/ML IJ SOLN
10.0000 mg | Freq: Four times a day (QID) | INTRAMUSCULAR | Status: AC
Start: 2015-12-04 — End: 2015-12-05
  Administered 2015-12-04 – 2015-12-05 (×4): 10 mg via INTRAVENOUS
  Filled 2015-12-04 (×4): qty 2

## 2015-12-04 MED FILL — Mannitol IV Soln 20%: INTRAVENOUS | Qty: 500 | Status: AC

## 2015-12-04 MED FILL — Heparin Sodium (Porcine) Inj 1000 Unit/ML: INTRAMUSCULAR | Qty: 20 | Status: AC

## 2015-12-04 MED FILL — Sodium Bicarbonate IV Soln 8.4%: INTRAVENOUS | Qty: 50 | Status: AC

## 2015-12-04 MED FILL — Electrolyte-R (PH 7.4) Solution: INTRAVENOUS | Qty: 6000 | Status: AC

## 2015-12-04 MED FILL — Sodium Chloride IV Soln 0.9%: INTRAVENOUS | Qty: 3000 | Status: AC

## 2015-12-04 MED FILL — Lidocaine HCl IV Inj 20 MG/ML: INTRAVENOUS | Qty: 5 | Status: AC

## 2015-12-04 NOTE — Progress Notes (Signed)
1 Day Post-Op Procedure(s) (LRB): BENTALL PROCEDURE USING 21MM ST. JUDE AORTIC VALVE CONDUIT (N/A) TRANSESOPHAGEAL ECHOCARDIOGRAM (TEE) (N/A) REPLACEMENT ASCENDING AORTA USING 28MM HEMASHIELD GRAFT (N/A) Subjective: sore  Objective: Vital signs in last 24 hours: Temp:  [95.9 F (35.5 C)-98.8 F (37.1 C)] 98.8 F (37.1 C) (10/03 0700) Pulse Rate:  [59-80] 77 (10/03 0700) Cardiac Rhythm: Normal sinus rhythm (10/03 0400) Resp:  [10-26] 14 (10/03 0700) BP: (84-134)/(55-86) 110/60 (10/03 0700) SpO2:  [96 %-100 %] 96 % (10/03 0700) Arterial Line BP: (91-146)/(50-113) 125/63 (10/03 0700) FiO2 (%):  [40 %-50 %] 40 % (10/02 1630) Weight:  [72.4 kg (159 lb 9.8 oz)] 72.4 kg (159 lb 9.8 oz) (10/03 0500)  Hemodynamic parameters for last 24 hours: PAP: (18-36)/(4-19) 36/19 CO:  [2.1 L/min-5.2 L/min] 3.1 L/min CI:  [1.2 L/min/m2-3 L/min/m2] 1.8 L/min/m2  Intake/Output from previous day: 10/02 0701 - 10/03 0700 In: 6113 [P.O.:300; I.V.:3326; Blood:1387; IV Piggyback:1100] Out: 5540 [Urine:3405; Emesis/NG output:100; Blood:1635; Chest Tube:400] Intake/Output this shift: No intake/output data recorded.  General appearance: alert and cooperative Neurologic: intact Heart: regular rate and rhythm and crisp mechanical valve click, rub from tubes Lungs: clear to auscultation bilaterally Extremities: edema mild Wound: dressing dry  Lab Results:  Recent Labs  12/03/15 2005 12/03/15 2012 12/04/15 0415  WBC 11.1*  --  12.4*  HGB 12.4 11.9* 12.6  HCT 37.1 35.0* 38.2  PLT 86*  --  86*   BMET:  Recent Labs  12/03/15 2012 12/04/15 0415  NA 141 138  K 4.6 4.4  CL 108 110  CO2  --  23  GLUCOSE 163* 129*  BUN 11 8  CREATININE 0.70 0.74  CALCIUM  --  7.4*    PT/INR:  Recent Labs  12/03/15 1403  LABPROT 16.9*  INR 1.36   ABG    Component Value Date/Time   PHART 7.339 (L) 12/03/2015 1803   HCO3 22.8 12/03/2015 1803   TCO2 22 12/03/2015 2012   ACIDBASEDEF 3.0 (H) 12/03/2015  1803   O2SAT 98.0 12/03/2015 1803   CBG (last 3)   Recent Labs  12/03/15 1601 12/03/15 2351 12/04/15 0412  GLUCAP 112* 122* 126*   CXR: ok  ECG: sinus, prolonged QTc, no acute changes.  498 Assessment/Plan: S/P Procedure(s) (LRB): BENTALL PROCEDURE USING 21MM ST. JUDE AORTIC VALVE CONDUIT (N/A) TRANSESOPHAGEAL ECHOCARDIOGRAM (TEE) (N/A) REPLACEMENT ASCENDING AORTA USING 28MM HEMASHIELD GRAFT (N/A)  She is hemodynamically stable in sinus rhythm Mobilize Diuresis d/c tubes/lines Continue foley due to diuresing patient and patient in ICU Start coumadin slowly tonight. Will DC ASA. See progression orders   LOS: 1 day    Alleen BorneBryan K Shayle Donahoo 12/04/2015

## 2015-12-04 NOTE — Progress Notes (Signed)
TCTS BRIEF SICU PROGRESS NOTE  1 Day Post-Op  S/P Procedure(s) (LRB): BENTALL PROCEDURE USING 21MM ST. JUDE AORTIC VALVE CONDUIT (N/A) TRANSESOPHAGEAL ECHOCARDIOGRAM (TEE) (N/A) REPLACEMENT ASCENDING AORTA USING 28MM HEMASHIELD GRAFT (N/A)   Stable day NSR w/ stable BP Breathing comfortably w/ O2 sats 95% on 2 L/min via [place] UOP adequate Labs okay  Plan: Continue current plan  Purcell Nailslarence H Lysandra Loughmiller, MD 12/04/2015 6:37 PM

## 2015-12-04 NOTE — Care Management Note (Signed)
Case Management Note  Patient Details  Name: Pura SpiceDonna Folkes MRN: [id number] Date of Birth: 12/05/1960  Subjective/Objective:       S/p Bentall procedure             Action/Plan:  PTA independent from home with husband.  Pt states family will be support system once discharge.  CM will continue to follow for discharge needs   Expected Discharge Date:                  Expected Discharge Plan:  Home/Self Care  In-House Referral:     Discharge planning Services  CM Consult  Post Acute Care Choice:    Choice offered to:     DME Arranged:    DME Agency:     HH Arranged:    HH Agency:     Status of Service:  In process, will continue to follow  If discussed at Long Length of Stay Meetings, dates discussed:    Additional Comments:  Cherylann ParrClaxton, Ascencion Coye S, RN 12/04/2015, 10:21 AM

## 2015-12-05 ENCOUNTER — Inpatient Hospital Stay (HOSPITAL_COMMUNITY): Payer: BLUE CROSS/BLUE SHIELD

## 2015-12-05 LAB — BASIC METABOLIC PANEL
Anion gap: 4 — ABNORMAL LOW (ref 5–15)
BUN: 14 mg/dL (ref 6–20)
CALCIUM: 8 mg/dL — AB (ref 8.9–10.3)
CO2: 29 mmol/L (ref 22–32)
CREATININE: 0.87 mg/dL (ref 0.44–1.00)
Chloride: 100 mmol/L — ABNORMAL LOW (ref 101–111)
GFR calc Af Amer: >60 mL/min (ref >60)
GFR calc non Af Amer: >60 mL/min (ref >60)
GLUCOSE: 131 mg/dL — AB (ref 65–99)
Potassium: 4.1 mmol/L (ref 3.5–5.1)
Sodium: 133 mmol/L — ABNORMAL LOW (ref 135–145)

## 2015-12-05 LAB — CBC
HCT: 38.7 % (ref 36.0–46.0)
Hemoglobin: 12.8 g/dL (ref 12.0–15.0)
MCH: 30.5 pg (ref 26.0–34.0)
MCHC: 33.1 g/dL (ref 30.0–36.0)
MCV: 92.4 fL (ref 78.0–100.0)
PLATELETS: 95 10*3/uL — AB (ref 150–400)
RBC: 4.19 MIL/uL (ref 3.87–5.11)
RDW: 13.7 % (ref 11.5–15.5)
WBC: 12.2 10*3/uL — ABNORMAL HIGH (ref 4.0–10.5)

## 2015-12-05 LAB — GLUCOSE, CAPILLARY: GLUCOSE-CAPILLARY: 131 mg/dL — AB (ref 65–99)

## 2015-12-05 LAB — PROTIME-INR
INR: 1.12
PROTHROMBIN TIME: 14.5 s (ref 11.4–15.2)

## 2015-12-05 MED ORDER — ONDANSETRON HCL 4 MG PO TABS: 4.0000 mg | ORAL_TABLET | Freq: Four times a day (QID) | ORAL | Status: DC | PRN

## 2015-12-05 MED ORDER — POTASSIUM CHLORIDE CRYS ER 20 MEQ PO TBCR
20.0000 meq | EXTENDED_RELEASE_TABLET | Freq: Two times a day (BID) | ORAL | Status: AC
  Administered 2015-12-05 – 2015-12-07 (×6): 20 meq via ORAL
  Filled 2015-12-05 (×6): qty 1

## 2015-12-05 MED ORDER — SODIUM CHLORIDE 0.9% FLUSH: 3.0000 mL | INTRAVENOUS | Status: DC | PRN

## 2015-12-05 MED ORDER — METOLAZONE 5 MG PO TABS
5.0000 mg | ORAL_TABLET | Freq: Every day | ORAL | Status: DC
  Administered 2015-12-05: 5 mg via ORAL
  Filled 2015-12-05: qty 1

## 2015-12-05 MED ORDER — OXYCODONE HCL 5 MG PO TABS: 5.0000 mg | ORAL_TABLET | ORAL | Status: DC | PRN

## 2015-12-05 MED ORDER — TRAMADOL HCL 50 MG PO TABS: 50.0000 mg | ORAL_TABLET | ORAL | Status: DC | PRN

## 2015-12-05 MED ORDER — BISACODYL 5 MG PO TBEC: 10.0000 mg | DELAYED_RELEASE_TABLET | Freq: Every day | ORAL | Status: DC | PRN

## 2015-12-05 MED ORDER — METOPROLOL TARTRATE 12.5 MG HALF TABLET: 12.5000 mg | ORAL_TABLET | Freq: Two times a day (BID) | ORAL | Status: DC

## 2015-12-05 MED ORDER — ACETAMINOPHEN 325 MG PO TABS
650.0000 mg | ORAL_TABLET | Freq: Four times a day (QID) | ORAL | Status: DC | PRN
  Administered 2015-12-06 – 2015-12-09 (×3): 650 mg via ORAL
  Filled 2015-12-05 (×3): qty 2

## 2015-12-05 MED ORDER — SODIUM CHLORIDE 0.9 % IV SOLN: 250.0000 mL | INTRAVENOUS | Status: DC | PRN

## 2015-12-05 MED ORDER — MOVING RIGHT ALONG BOOK
Freq: Once | Status: AC
  Administered 2015-12-05: 18:00:00
  Filled 2015-12-05 (×2): qty 1

## 2015-12-05 MED ORDER — FUROSEMIDE 40 MG PO TABS
40.0000 mg | ORAL_TABLET | Freq: Every day | ORAL | Status: AC
  Administered 2015-12-05 – 2015-12-07 (×3): 40 mg via ORAL
  Filled 2015-12-05 (×3): qty 1

## 2015-12-05 MED ORDER — SODIUM CHLORIDE 0.9% FLUSH
3.0000 mL | Freq: Two times a day (BID) | INTRAVENOUS | Status: DC
  Administered 2015-12-05 – 2015-12-09 (×7): 3 mL via INTRAVENOUS

## 2015-12-05 MED ORDER — FAMOTIDINE 20 MG PO TABS
20.0000 mg | ORAL_TABLET | Freq: Two times a day (BID) | ORAL | Status: DC
  Administered 2015-12-05 – 2015-12-09 (×9): 20 mg via ORAL
  Filled 2015-12-05 (×10): qty 1

## 2015-12-05 MED ORDER — ONDANSETRON HCL 4 MG/2ML IJ SOLN: 4.0000 mg | Freq: Four times a day (QID) | INTRAMUSCULAR | Status: DC | PRN

## 2015-12-05 MED ORDER — DOCUSATE SODIUM 100 MG PO CAPS: 200.0000 mg | ORAL_CAPSULE | Freq: Every day | ORAL | Status: DC

## 2015-12-05 MED ORDER — BISACODYL 10 MG RE SUPP: 10.0000 mg | Freq: Every day | RECTAL | Status: DC | PRN

## 2015-12-05 NOTE — Progress Notes (Signed)
Pt arrived to 2w14. Tele started, verified x2. VSS. Pt in no distress. Rates pain 0/10. Call bell in reach. Will continue to monitor.  Margarito LinerStephanie M Shylin Keizer, RN

## 2015-12-05 NOTE — Progress Notes (Signed)
Patient ambulated from 2S13 to 2W14 standby assist with a wheelchair, patient back to bed, placed on tele, call bell in reach and receiving RN at bedside. Belongings at bedside. No further questions from patient or receiving RN. Patient due to void at 1900.  Hermina BartersBOWMAN, Jos Cygan M, RN

## 2015-12-05 NOTE — Progress Notes (Signed)
2 Days Post-Op Procedure(s) (LRB): BENTALL PROCEDURE USING 21MM ST. JUDE AORTIC VALVE CONDUIT (N/A) TRANSESOPHAGEAL ECHOCARDIOGRAM (TEE) (N/A) REPLACEMENT ASCENDING AORTA USING 28MM HEMASHIELD GRAFT (N/A) Subjective: sore  Objective: Vital signs in last 24 hours: Temp:  [97.6 F (36.4 C)-98.5 F (36.9 C)] 98.5 F (36.9 C) (10/04 0400) Pulse Rate:  [80-92] 89 (10/04 0700) Cardiac Rhythm: (P) Normal sinus rhythm (10/04 0745) Resp:  [13-29] 20 (10/04 0700) BP: (115-150)/(55-87) 115/55 (10/04 0700) SpO2:  [88 %-97 %] 92 % (10/04 0700) Weight:  [73.5 kg (162 lb 0.6 oz)] 73.5 kg (162 lb 0.6 oz) (10/04 0500)  Hemodynamic parameters for last 24 hours:    Intake/Output from previous day: 10/03 0701 - 10/04 0700 In: 1090 [P.O.:300; I.V.:690; IV Piggyback:100] Out: 2325 [Urine:2225; Chest Tube:100] Intake/Output this shift: No intake/output data recorded.  General appearance: alert and cooperative Neurologic: intact Heart: regular rate and rhythm and crisp mechanical valve click Lungs: clear to auscultation bilaterally Extremities: edema mild Wound: dressing dry  Lab Results:  Recent Labs  12/04/15 1651 12/05/15 0500  WBC 12.6* 12.2*  HGB 12.5 12.8  HCT 38.3 38.7  PLT 104* 95*   BMET:  Recent Labs  12/04/15 0415 12/04/15 1635 12/04/15 1651 12/05/15 0500  NA 138 136  --  133*  K 4.4 4.2  --  4.1  CL 110 97*  --  100*  CO2 23  --   --  29  GLUCOSE 129* 143*  --  131*  BUN 8 13  --  14  CREATININE 0.74 1.00 0.96 0.87  CALCIUM 7.4*  --   --  8.0*    PT/INR:  Recent Labs  12/05/15 0500  LABPROT 14.5  INR 1.12   ABG    Component Value Date/Time   PHART 7.339 (L) 12/03/2015 1803   HCO3 22.8 12/03/2015 1803   TCO2 27 12/04/2015 1635   ACIDBASEDEF 3.0 (H) 12/03/2015 1803   O2SAT 98.0 12/03/2015 1803   CBG (last 3)   Recent Labs  12/04/15 1139 12/04/15 1543 12/05/15 0730  GLUCAP 172* 160* 131*   CLINICAL DATA:  55 year old female status post aortic  valve replacement. Initial encounter.  EXAM: PORTABLE CHEST 1 VIEW  COMPARISON:  12/04/2015 and earlier.  FINDINGS: Portable AP semi upright view at 0615 hours. Swan-Ganz catheter removed. Right IJ introducer sheath remains. Mediastinal tube and left chest tube removed. No pneumothorax identified. Continued veiling opacity at the left lung base. Mildly increased veiling opacity at the right lung base. Stable cardiomegaly and mediastinal contours. Aortic valve ring appears stable.  IMPRESSION: 1. Lines and tubes removed, right IJ introducer sheath remains. 2. No pneumothorax. 3. Continued left lung base an increased right lung base veiling opacities favored due to pleural effusions with atelectasis.   Electronically Signed   By: Odessa FlemingH  Hall M.D.   On: 12/05/2015 07:29  Assessment/Plan: S/P Procedure(s) (LRB): BENTALL PROCEDURE USING 21MM ST. JUDE AORTIC VALVE CONDUIT (N/A) TRANSESOPHAGEAL ECHOCARDIOGRAM (TEE) (N/A) REPLACEMENT ASCENDING AORTA USING 28MM HEMASHIELD GRAFT (N/A)  She is hemodynamically stable in sinus rhythm  Still has volume excess: will continue lasix and metolazone for a couple more days. Replete K+  Coumadin 2.5 mg daily  DC sleeve and foley  Transfer to 2W and continue mobilization   LOS: 2 days    Alleen BorneBryan K Bartle 12/05/2015

## 2015-12-06 DIAGNOSIS — Z952 Presence of prosthetic heart valve: Secondary | ICD-10-CM

## 2015-12-06 DIAGNOSIS — Z95828 Presence of other vascular implants and grafts: Secondary | ICD-10-CM

## 2015-12-06 LAB — PROTIME-INR
INR: 1.12
Prothrombin Time: 14.4 seconds (ref 11.4–15.2)

## 2015-12-06 LAB — BASIC METABOLIC PANEL
Anion gap: 9 (ref 5–15)
BUN: 9 mg/dL (ref 6–20)
CHLORIDE: 95 mmol/L — AB (ref 101–111)
CO2: 34 mmol/L — ABNORMAL HIGH (ref 22–32)
CREATININE: 0.76 mg/dL (ref 0.44–1.00)
Calcium: 8.8 mg/dL — ABNORMAL LOW (ref 8.9–10.3)
GFR calc Af Amer: >60 mL/min (ref >60)
GFR calc non Af Amer: >60 mL/min (ref >60)
GLUCOSE: 107 mg/dL — AB (ref 65–99)
Potassium: 3.4 mmol/L — ABNORMAL LOW (ref 3.5–5.1)
SODIUM: 138 mmol/L (ref 135–145)

## 2015-12-06 MED ORDER — AMIODARONE HCL IN DEXTROSE 360-4.14 MG/200ML-% IV SOLN
30.0000 mg/h | INTRAVENOUS | Status: DC
  Administered 2015-12-07: 30 mg/h via INTRAVENOUS

## 2015-12-06 MED ORDER — WARFARIN VIDEO
Freq: Once | Status: AC
  Administered 2015-12-06: 15:00:00

## 2015-12-06 MED ORDER — CHLORHEXIDINE GLUCONATE 0.12 % MT SOLN
OROMUCOSAL | Status: AC
  Filled 2015-12-06: qty 15

## 2015-12-06 MED ORDER — AMIODARONE HCL IN DEXTROSE 360-4.14 MG/200ML-% IV SOLN
60.0000 mg/h | INTRAVENOUS | Status: AC
  Administered 2015-12-06 – 2015-12-07 (×2): 60 mg/h via INTRAVENOUS
  Filled 2015-12-06 (×2): qty 200

## 2015-12-06 MED ORDER — WARFARIN SODIUM 5 MG PO TABS
5.0000 mg | ORAL_TABLET | Freq: Every day | ORAL | Status: DC
  Administered 2015-12-06: 5 mg via ORAL
  Filled 2015-12-06: qty 2

## 2015-12-06 MED ORDER — AMIODARONE LOAD VIA INFUSION
150.0000 mg | Freq: Once | INTRAVENOUS | Status: AC
  Administered 2015-12-06: 150 mg via INTRAVENOUS
  Filled 2015-12-06: qty 83.34

## 2015-12-06 MED ORDER — WARFARIN - PHYSICIAN DOSING INPATIENT
Freq: Every day | Status: DC
  Administered 2015-12-06: 1

## 2015-12-06 MED ORDER — COUMADIN BOOK
Freq: Once | Status: AC
  Administered 2015-12-06: 15:00:00
  Filled 2015-12-06: qty 1

## 2015-12-06 MED ORDER — POTASSIUM CHLORIDE CRYS ER 20 MEQ PO TBCR
40.0000 meq | EXTENDED_RELEASE_TABLET | Freq: Once | ORAL | Status: AC
Start: 2015-12-06 — End: 2015-12-06
  Administered 2015-12-06: 40 meq via ORAL
  Filled 2015-12-06: qty 2

## 2015-12-06 MED ORDER — METOPROLOL TARTRATE 25 MG/10 ML ORAL SUSPENSION
25.0000 mg | Freq: Two times a day (BID) | ORAL | Status: DC
  Filled 2015-12-06 (×2): qty 10

## 2015-12-06 MED ORDER — METOPROLOL TARTRATE 25 MG PO TABS
25.0000 mg | ORAL_TABLET | Freq: Two times a day (BID) | ORAL | Status: DC
  Administered 2015-12-06 – 2015-12-09 (×7): 25 mg via ORAL
  Filled 2015-12-06 (×7): qty 1

## 2015-12-06 MED ORDER — LACTULOSE 10 GM/15ML PO SOLN
20.0000 g | Freq: Once | ORAL | Status: AC
  Administered 2015-12-06: 20 g via ORAL
  Filled 2015-12-06: qty 30

## 2015-12-06 NOTE — Discharge Summary (Signed)
Physician Discharge Summary  Patient ID: Kadeshia Kasparian MRN: [id number] DOB/AGE: Jul 03, 1960 55 y.o.  Admit date: 12/03/2015 Discharge date: 12/09/2015  Admission Diagnoses:  Patient Active Problem List   Diagnosis Date Noted  . Severe aortic insufficiency 12/03/2015  . Aortic insufficiency   . Fam hx-ischem heart disease 08/31/2010  . Eustachian tube dysfunction 08/31/2010  . Wax in ear 08/31/2010  . Decreased hearing 08/31/2010  . ANEMIA, MILD 10/19/2009  . SEDIMENTATION RATE, ELEVATED 10/19/2009  . VITAMIN D DEFICIENCY 09/18/2009  . BACK PAIN, THORACIC REGION 09/18/2009  . PALPITATIONS 09/18/2009   Discharge Diagnoses:   Patient Active Problem List   Diagnosis Date Noted  . S/P AVR 12/06/2015  . Hx of ascending aorta replacement 12/06/2015  . Severe aortic insufficiency 12/03/2015  . Aortic insufficiency   . Fam hx-ischem heart disease 08/31/2010  . Eustachian tube dysfunction 08/31/2010  . Wax in ear 08/31/2010  . Decreased hearing 08/31/2010  . ANEMIA, MILD 10/19/2009  . SEDIMENTATION RATE, ELEVATED 10/19/2009  . VITAMIN D DEFICIENCY 09/18/2009  . BACK PAIN, THORACIC REGION 09/18/2009  . PALPITATIONS 09/18/2009  Post op a fib (converted to SR)  Discharged Condition: good  History of Present Illness:  Ms. Schneck is a 55 yo female with known history of mild Mitral Regurgitation.  Recently she was noted have a new heart murmur on physical exam.  Follow up Echocardiogram showed severe AI with normal LV function.  Further workup with TEE was performed and confirmed the presence of severe AI, mild MR and a small PFO.  Due to this she was referred to Dr. Laneta Simmers for surgical intervention.  She was evaluated by Dr. Laneta Simmers at which time the patient admitted to a several month history of fatigue.  She exercises but develops shortness of breath pretty quickly.  She denied chest pain.  It was felt she should undergo Aortic Valve replacement.  However CTA of the chest would need to be  completed to assess the size of her Aortic Root.  This was obtained and showed a mildly enlarged Aorta.  It was felt she should undergo Bentall procedure.  The risks and benefits of the procedure were explained to the patient and she was agreeable to proceed.  Hospital Course:   She presented to [facility] on [facility].  She was taken to the operating room and underwent Replacement of Ascending Aorta using a 28 mm Hemashield graft and a Bentall procedure with a St. Jude 21 mm Mechanical valved graft.  She tolerated the procedure without difficulty and was taken to the SICU in stable condition. She was extubated the evening of surgery.  During her stay in the SICU the patient's chest tubes and arterial lines were removed without difficulty.  She was started on low dose Coumadin for her mechanical valve.  She was hypervolemic and responded well to Lasix and Metolazone.  She was maintaining NSR and felt stable for transfer to the telemetry unit.  The patient continues to make progress.  Pacing wires were removed on 10/05. She then went into a fib with RVR. She given IV Amiodarone. She converted to sinus rhythm and remained in sinus rhythm.  She remains on coumadin.  Her INR is 1.81 and she will be discharged home on 5 mg of Coumadin daily.  I discussed this with the attending. Her INR needs to be in the range of 2.0-3.0.  She is ambulating independently.  She is tolerating a diet and her pain is well controlled.  She was taking  a non-formulary low-dose thyroid medication for weight loss per the patient therefore she will need follow-up with her GYN outpatient. She will need labs for TSH, T4 and T3. This was discontinued on discharge due to the addition of Amiodarone. She is felt medically stable for discharge home today.      Significant Diagnostic Studies: cardiac graphics:   Echocardiogram:   - Left ventricle: The cavity size was normal. Systolic function was   normal. The estimated ejection  fraction was in the range of 50%   to 55%. Wall motion was normal; there were no regional wall   motion abnormalities. Left ventricular diastolic function   parameters were normal. Internal dimension, ES (PLAX chordal):   39.2 mm. - Aortic valve: A bicuspid morphology cannot be excluded; normal   thickness leaflets. There was severe regurgitation. - Aorta: Aortic root dimension: 38 mm (ED). - Ascending aorta: The ascending aorta was mildly dilated.  Impressions:  - When compared to prior, aortic regurgitation is now reported.  Treatments: surgery:   1. Median Sternotomy 2. Extracorporeal circulation 3.   Replacement of ascending aorta using a 28 mm Hemashield graft (hemi-arch) under deep hypothermic circulatory arrest. 4.   Bentall procedure using a 21 mm St. Jude mechanical valved graft.  Disposition: 01-Home or Self Care   Discharge Medications:  The patient has been discharged on:   1.Beta Blocker:  Yes [ x  ]                              No   [   ]                              If No, reason:  2.Ace Inhibitor/ARB: Yes [   ]                                     No  [    ]                                     If No, reason:  3.Statin:   Yes [   ]                  No  [x   ]                  If No, reason: No CAD  4.Marlowe KaysValentino Hue  [   ]                  No   [   ]                  If No, reason:   Discharge Instructions    Amb Referral to Cardiac Rehabilitation    Complete by:  As directed    Diagnosis:  Valve Replacement   Valve:  Aortic       Medication List    STOP taking these medications   Echinacea 400 MG Caps   NONFORMULARY OR COMPOUNDED ITEM     TAKE these medications   acetaminophen 325 MG tablet Commonly known as:  TYLENOL Take 2 tablets (650 mg total) by mouth every 6 (six) hours as needed for  mild pain.   amiodarone 200 MG tablet Commonly known as:  PACERONE Take one tab twice a day for 14 days then switch to one tab daily.   aspirin 81  MG tablet Take 81 mg by mouth at bedtime.   fish oil-omega-3 fatty acids 1000 MG capsule Take 2 g by mouth daily.   glucosamine-chondroitin 500-400 MG tablet Take 1 tablet by mouth daily.   lisinopril 5 MG tablet Commonly known as:  PRINIVIL,ZESTRIL Take 1 tablet (5 mg total) by mouth daily. Start taking on:  12/10/2015   MAGNESIUM CARBONATE PO Take 1 tablet by mouth daily.   metoprolol tartrate 25 MG tablet Commonly known as:  LOPRESSOR Take 1 tablet (25 mg total) by mouth 2 (two) times daily.   multivitamin with minerals Tabs tablet Take 1 tablet by mouth daily.   NON FORMULARY Apply 0.5 mLs topically daily. 70/30 mix of estriol and estradiol in a cream base. Compounded by [facility].   PRESCRIPTION MEDICATION Apply 0.1 mLs topically daily. Testosterone 2% cream compounded   progesterone 100 MG capsule Commonly known as:  PROMETRIUM Take 100 mg by mouth at bedtime.   traMADol 50 MG tablet Commonly known as:  ULTRAM Take 1 tablet (50 mg total) by mouth every 6 (six) hours as needed for moderate pain.   vitamin B-12 1000 MCG tablet Commonly known as:  CYANOCOBALAMIN Take 1,000 mcg by mouth daily.   vitamin C 500 MG tablet Commonly known as:  ASCORBIC ACID Take 1,000 mg by mouth daily.   Vitamin D 1000 units capsule Take 2,000 Units by mouth daily.   warfarin 5 MG tablet Commonly known as:  COUMADIN Take 1 tablet (5 mg total) by mouth daily.      Follow-up Information    Alleen BorneBryan K Bartle, MD Follow up on 01/09/2016.   Specialty:  Cardiothoracic Surgery Why:  Appointment is at 12:00. Please report at 11:30am [facility]. It is located on the first floor of our building.  Contact information: [address] [place] [address] [phone]        [facility] Follow up on 12/10/2015.   Specialty:  Cardiology Why:  Appointment is at 9:30 for PT/INR Draw  Contact information: [address], [address] [place] [id number] [phone]       Robbie LisBrittainy Simmons, PA-C Follow up on 12/26/2015.   Specialties:  Cardiology, Radiology Why:  Appointment is at 10:00 Contact information: [address] [place] [address] [phone]          The patient has been discharged on:   1.Beta Blocker:  Yes [ x  ]                              No   [   ]                              If No, reason:  2.Ace Inhibitor/ARB: Yes [ x  ]                                     No  [    ]  If No, reason:  3.Statin:   Yes [   ]                  No  [ x  ]                  If No, reason: no indication.   4.Ecasa:  Yes  [ x  ]                  No   [   ]                  If No, reason:   Signed: Sharlene Dory 12/09/2015, 10:57 AM

## 2015-12-06 NOTE — Progress Notes (Signed)
Pt pacing wires were pulled.They were intact. Pt instructed to be on bedrest for an hour. Vitals will be taking Q15. Will continue to monitor. Gregor HamsAlisha Abdulhamid Olgin, RN

## 2015-12-06 NOTE — Progress Notes (Signed)
CARDIAC REHAB PHASE I   PRE:  Rate/Rhythm: 87 SR    BP: sitting 130/80    SaO2: 93 2L, 89 RA  MODE:  Ambulation: 550 ft   POST:  Rate/Rhythm: 104 ST    BP: sitting 140/80     SaO2: 87-91 RA  Tolerated very well with RW. No c/o. To recliner. Pt walked on RA, SaO2 borderline at 87-91 RA. Reapplied 1 L for recliner, encouraged more IS (1000 mL) and walking. She can walk independently.  [phone]   Marissa MassonRandi Kristan [name] CES, ACSM 12/06/2015 10:09 AM

## 2015-12-06 NOTE — Progress Notes (Addendum)
      [address]       [place] [id number]             [phone]        3 Days Post-Op Procedure(s) (LRB): BENTALL PROCEDURE USING 21MM ST. JUDE AORTIC VALVE CONDUIT (N/A) TRANSESOPHAGEAL ECHOCARDIOGRAM (TEE) (N/A) REPLACEMENT ASCENDING AORTA USING 28MM HEMASHIELD GRAFT (N/A)  Subjective: Patient has not had a bowel movement yet.  Objective: Vital signs in last 24 hours: Temp:  [97.4 F (36.3 C)-99.1 F (37.3 C)] 99.1 F (37.3 C) (10/05 0428) Pulse Rate:  [80-96] 80 (10/05 0428) Cardiac Rhythm: Normal sinus rhythm (10/05 0709) Resp:  [18-26] 20 (10/05 0428) BP: (123-137)/(63-79) 136/69 (10/05 0428) SpO2:  [92 %-97 %] 95 % (10/05 0428) Weight:  [152 lb 11.2 oz (69.3 kg)] 152 lb 11.2 oz (69.3 kg) (10/05 0428)  Pre op weight 70 kg Current Weight  12/06/15 152 lb 11.2 oz (69.3 kg)       Intake/Output from previous day: 10/04 0701 - 10/05 0700 In: 50 [IV Piggyback:50] Out: 1235 [Urine:1235]   Physical Exam:  Cardiovascular: RRR, no murmur, sharp valve click Pulmonary: Mostly clear to auscultation bilaterally Abdomen: Soft, non tender, bowel sounds present. Extremities: Trace bilateral lower extremity edema. Wounds: Clean and dry.  No erythema or signs of infection.  Lab Results: CBC: Recent Labs  12/04/15 1651 12/05/15 0500  WBC 12.6* 12.2*  HGB 12.5 12.8  HCT 38.3 38.7  PLT 104* 95*   BMET:  Recent Labs  12/05/15 0500 12/06/15 0206  NA 133* 138  K 4.1 3.4*  CL 100* 95*  CO2 29 34*  GLUCOSE 131* 107*  BUN 14 9  CREATININE 0.87 0.76  CALCIUM 8.0* 8.8*    PT/INR:  Lab Results  Component Value Date   INR 1.12 12/06/2015   INR 1.12 12/05/2015   INR 1.36 12/03/2015   ABG:  INR: Will add last result for INR, ABG once components are confirmed Will add last 4 CBG results once components are confirmed  Assessment/Plan:  1. CV - SR in the 90's. On Lopressor 12.5 mg bid and Coumadin. INR remains 1.12 on Coumadin 2.5 mg so  will increase to 5 mg daily. Will increase Lopressor to 25 mg bid for better HR and BP control. 2.  Pulmonary - On room air. Encourage incentive spirometer 3. Volume Overload - On Lasix 40 mg daily and Metazolone 5 mg daily. 4.  Acute blood loss anemia - Last H and H stable 12.8 and 38.7 5. Supplement potassium 6. Remove EPW 7. LOC constipation 8. Hopefully, home in next few days  ZIMMERMAN,DONIELLE MPA-C 12/06/2015,7:27 AM   Chart reviewed, patient examined, agree with above. Bowels working now. She feels much better today.

## 2015-12-07 LAB — TYPE AND SCREEN
ABO/RH(D): A POS
ANTIBODY SCREEN: POSITIVE
DONOR AG TYPE: NEGATIVE
Donor AG Type: NEGATIVE
Donor AG Type: NEGATIVE
Donor AG Type: NEGATIVE
Unit division: 0
Unit division: 0
Unit division: 0
Unit division: 0

## 2015-12-07 LAB — PROTIME-INR
INR: 1.04
PROTHROMBIN TIME: 13.6 s (ref 11.4–15.2)

## 2015-12-07 MED ORDER — AMIODARONE HCL 200 MG PO TABS
400.0000 mg | ORAL_TABLET | Freq: Two times a day (BID) | ORAL | Status: DC
  Administered 2015-12-07 – 2015-12-09 (×5): 400 mg via ORAL
  Filled 2015-12-07 (×5): qty 2

## 2015-12-07 MED ORDER — WARFARIN SODIUM 7.5 MG PO TABS
7.5000 mg | ORAL_TABLET | Freq: Every day | ORAL | Status: DC
  Administered 2015-12-07: 7.5 mg via ORAL
  Filled 2015-12-07: qty 1

## 2015-12-07 NOTE — Progress Notes (Signed)
Patient is on atrial fibrillation with RVR confirmed with 12 leads EKG,patient is asymptomatic,Dr.Hendrickson made aware with order to start amiodarone per protocol with bolus.Amiodarone started and patient converted back to normal sinus rhythm right after the bolus of amiodarone.Will continue to monitor.

## 2015-12-07 NOTE — Progress Notes (Signed)
CARDIAC REHAB PHASE I   PRE:  Rate/Rhythm: 77 SR  BP:  Sitting: 124/75       SaO2: 98 RA  MODE:  Ambulation: 550 ft   POST:  Rate/Rhythm: 87 SR  BP:  Sitting: 131/77        SaO2: 93 RA  Pt ambulated with one assist and tolerated very well.  SaO2 ranged between 92-94% during exercise.  Discussed sternal precautions and IS use with pt.  Discussed diet (precautions w/ coumadin), exercise guidelines and CRPII.  Will refer to [place] CRPII.  Will continue to follow.  Pt returned to recliner with feet elevated and call bell in reach. [phone]  [name], MS 12/07/2015 11:46 AM

## 2015-12-07 NOTE — Progress Notes (Addendum)
      [address]       [place] [id number]             [phone]        4 Days Post-Op Procedure(s) (LRB): BENTALL PROCEDURE USING 21MM ST. JUDE AORTIC VALVE CONDUIT (N/A) TRANSESOPHAGEAL ECHOCARDIOGRAM (TEE) (N/A) REPLACEMENT ASCENDING AORTA USING 28MM HEMASHIELD GRAFT (N/A)  Subjective: Patient has had a bowel movement.  Objective: Vital signs in last 24 hours: Temp:  [98 F (36.7 C)-98.3 F (36.8 C)] 98.3 F (36.8 C) (10/06 0500) Pulse Rate:  [67-135] 67 (10/06 0500) Cardiac Rhythm: Normal sinus rhythm (10/06 0700) Resp:  [12-19] 18 (10/06 0500) BP: (77-148)/(42-74) 147/67 (10/06 0500) SpO2:  [94 %-97 %] 94 % (10/06 0500) Weight:  [149 lb (67.6 kg)] 149 lb (67.6 kg) (10/06 0500)  Pre op weight 70 kg Current Weight  12/07/15 149 lb (67.6 kg)       Intake/Output from previous day: 10/05 0701 - 10/06 0700 In: 240 [P.O.:240] Out: -    Physical Exam:  Cardiovascular: RRR, no murmur, sharp valve click Pulmonary: Mostly clear to auscultation bilaterally Abdomen: Soft, non tender, bowel sounds present. Extremities: No  lower extremity edema. Wounds: Clean and dry.  No erythema or signs of infection.  Lab Results: CBC:  Recent Labs  12/04/15 1651 12/05/15 0500  WBC 12.6* 12.2*  HGB 12.5 12.8  HCT 38.3 38.7  PLT 104* 95*   BMET:   Recent Labs  12/05/15 0500 12/06/15 0206  NA 133* 138  K 4.1 3.4*  CL 100* 95*  CO2 29 34*  GLUCOSE 131* 107*  BUN 14 9  CREATININE 0.87 0.76  CALCIUM 8.0* 8.8*    PT/INR:  Lab Results  Component Value Date   INR 1.04 12/07/2015   INR 1.12 12/06/2015   INR 1.12 12/05/2015   ABG:  INR: Will add last result for INR, ABG once components are confirmed Will add last 4 CBG results once components are confirmed  Assessment/Plan:  1. CV - Went into a fib last night. Converted to sinus rhythm after given Amiodarone bolus. On Amiodarone drip, Lopressor 25 mg bid, and Coumadin. INR slightly  decreased to 1.04. She is on Coumadin 5 mg daily. Will stop Amiodarone drip and start oral. Will give Coumadin 7.5 mg this evening to try to increase INR. 2.  Pulmonary - On1 liter via [place] over night. Encourage incentive spirometer 3. Volume Overload - On Lasix 40 mg daily and Metazolone 5 mg daily. 4.  Acute blood loss anemia - Last H and H stable 12.8 and 38.7 5. Thrombocytopenia-last platelets 95,000 6. Hopefully, home this weekend.  Manville Rico MPA-C 12/07/2015,8:05 AM

## 2015-12-07 NOTE — Discharge Instructions (Signed)
Aortic Valve Replacement, Care After Refer to this sheet in the next few weeks. These instructions provide you with information on caring for yourself after your procedure. Your health care provider may also give you specific instructions. Your treatment has been planned according to current medical practices, but problems sometimes occur. Call your health care provider if you have any problems or questions after your procedure. HOME CARE INSTRUCTIONS   Take medicines only as directed by your health care provider.  If your health care provider has prescribed elastic stockings, wear them as directed.  Take frequent naps or rest often throughout the day.  Avoid lifting over 10 lbs (4.5 kg) or pushing or pulling things with your arms for 6-8 weeks or as directed by your health care provider.  Avoid driving or airplane travel for 4-6 weeks after surgery or as directed by your health care provider. If you are riding in a car for an extended period, stop every 1-2 hours to stretch your legs. Keep a record of your medicines and medical history with you when traveling.  Do not drive or operate heavy machinery while taking pain medicine. (narcotics).  Do not cross your legs.  Do not use any tobacco products including cigarettes, chewing tobacco, or electronic cigarettes. If you need help quitting, ask your health care provider.  Do not take baths, swim, or use a hot tub until your health care provider approves. Take showers once your health care provider approves. Pat incisions dry. Do not rub incisions with a washcloth or towel.  Avoid climbing stairs and using the handrail to pull yourself up for the first 2-3 weeks after surgery.  Return to work as directed by your health care provider.  Drink enough fluid to keep your urine clear or pale yellow.  Do not strain to have a bowel movement. Eat high-fiber foods if you become constipated. You may also take a medicine to help you have a bowel  movement (laxative) as directed by your health care provider.  Resume sexual activity as directed by your health care provider. Men should not use medicines for erectile dysfunction until their doctor says it isokay.  If you had a certain type of heart condition in the past, you may need to take antibiotic medicine before having dental work or surgery. Let your dentist and health care providers know if you had one or more of the following:  Previous endocarditis.  An artificial (prosthetic) heart valve.  Congenital heart disease. SEEK MEDICAL CARE IF:  You develop a skin rash.   You experience sudden changes in your weight.  You have a fever. SEEK IMMEDIATE MEDICAL CARE IF:   You develop chest pain that is not coming from your incision.  You have drainage (pus), redness, swelling, or pain at your incision site.   You develop shortness of breath or have difficulty breathing.   You have increased bleeding from your incision site.   You develop light-headedness.  MAKE SURE YOU:   Understand these directions.  Will watch your condition.  Will get help right away if you are not doing well or get worse.   This information is not intended to replace advice given to you by your health care provider. Make sure you discuss any questions you have with your health care provider.   Document Released: 09/05/2004 Document Revised: 03/10/2014 Document Reviewed: 12/02/2011 Elsevier Interactive Patient Education 2016 [employer]  Vitamin K Foods and Warfarin Warfarin is a medicine that helps prevent harmful blood clots by  causing blood to clot more slowly. It does this by decreasing the activity of vitamin K, which promotes normal blood clotting. For the dose of warfarin you have been prescribed to work well, you need to get about the same amount of vitamin K from your food from day to day. Suddenly getting a lot more vitamin K could cause your blood to clot too quickly. A sudden  decrease in vitamin K intake could cause your blood to clot too slowly. These changes in vitamin K intake could lead to dangerous blood clotsor to bleeding. WHAT GENERAL GUIDELINES DO I NEED TO FOLLOW?  Keep your intake of vitamin K consistent from day to day. To do this, you must be aware of which foods contain moderate or high amounts of vitamin K. Listed below are some foods that are very high, high, or moderately high in vitamin K. If you eat these foods, make sure you eat a consistent amount of them from day to day.  Avoid major changes in your diet, or tell your health care provider before changing your diet.  If you take a multivitamin that contains vitamin K, be sure to take it every day.  If you drink green tea, drink the same amount each day. WHAT FOODS ARE VERY HIGH IN VITAMIN K?   Greens, such as Swiss chard and beet, collard, mustard, or turnip greens (fresh or frozen, cooked).  Kale (fresh or frozen, cooked).   Parsley (raw).  Spinach (cooked).  WHAT FOODS ARE HIGH IN VITAMIN K?  Asparagus (frozen, cooked).  Broccoli.   Bok choy (cooked).   Brussels sprouts (fresh or frozen, cooked).  Cabbage (cooked).  Coleslaw. WHAT FOODS ARE MODERATELY HIGH IN VITAMIN K?  Blueberries.  Black-eyed peas.  Endive (raw).   Green leaf lettuce (raw).   Green scallions (raw).  Kale (raw).  Okra (frozen, cooked).  Plantains (fried).  Romaine lettuce (raw).   Sauerkraut (canned).   Spinach (raw).   This information is not intended to replace advice given to you by your health care provider. Make sure you discuss any questions you have with your health care provider.   Document Released: 12/15/2008 Document Revised: 03/10/2014 Document Reviewed: 12/22/2012 Elsevier Interactive Patient Education [employer]  Information on my medicine - Coumadin   (Warfarin)  This medication education was reviewed with me or my healthcare representative as part  of my discharge preparation.  The pharmacist that spoke with me during my hospital stay was:  Elwin Sleight, [facility]  Why was Coumadin prescribed for you? Coumadin was prescribed for you because you have a blood clot or a medical condition that can cause an increased risk of forming blood clots. Blood clots can cause serious health problems by blocking the flow of blood to the heart, lung, or brain. Coumadin can prevent harmful blood clots from forming. As a reminder your indication for Coumadin is:   Blood Clot Prevention After Heart Valve Surgery  What test will check on my response to Coumadin? While on Coumadin (warfarin) you will need to have an INR test regularly to ensure that your dose is keeping you in the desired range. The INR (international normalized ratio) number is calculated from the result of the laboratory test called prothrombin time (PT).  If an INR APPOINTMENT HAS NOT ALREADY BEEN MADE FOR YOU please schedule an appointment to have this lab work done by your health care provider within 7 days. Your INR goal is usually a number between:  2 to  3 or your provider may give you a more narrow range like 2-2.5.  Ask your health care provider during an office visit what your goal INR is.  What  do you need to  know  About  COUMADIN? Take Coumadin (warfarin) exactly as prescribed by your healthcare provider about the same time each day.  DO NOT stop taking without talking to the doctor who prescribed the medication.  Stopping without other blood clot prevention medication to take the place of Coumadin may increase your risk of developing a new clot or stroke.  Get refills before you run out.  What do you do if you miss a dose? If you miss a dose, take it as soon as you remember on the same day then continue your regularly scheduled regimen the next day.  Do not take two doses of Coumadin at the same time.  Important Safety Information A possible side effect of Coumadin (Warfarin) is an  increased risk of bleeding. You should call your healthcare provider right away if you experience any of the following: ? Bleeding from an injury or your nose that does not stop. ? Unusual colored urine (red or dark brown) or unusual colored stools (red or black). ? Unusual bruising for unknown reasons. ? A serious fall or if you hit your head (even if there is no bleeding).  Some foods or medicines interact with Coumadin (warfarin) and might alter your response to warfarin. To help avoid this: ? Eat a balanced diet, maintaining a consistent amount of Vitamin K. ? Notify your provider about major diet changes you plan to make. ? Avoid alcohol or limit your intake to 1 drink for women and 2 drinks for men per day. (1 drink is 5 oz. wine, 12 oz. beer, or 1.5 oz. liquor.)  Make sure that ANY health care provider who prescribes medication for you knows that you are taking Coumadin (warfarin).  Also make sure the healthcare provider who is monitoring your Coumadin knows when you have started a new medication including herbals and non-prescription products.  Coumadin (Warfarin)  Major Drug Interactions  Increased Warfarin Effect Decreased Warfarin Effect  Alcohol (large quantities) Antibiotics (esp. Septra/Bactrim, Flagyl, Cipro) Amiodarone (Cordarone) Aspirin (ASA) Cimetidine (Tagamet) Megestrol (Megace) NSAIDs (ibuprofen, naproxen, etc.) Piroxicam (Feldene) Propafenone (Rythmol SR) Propranolol (Inderal) Isoniazid (INH) Posaconazole (Noxafil) Barbiturates (Phenobarbital) Carbamazepine (Tegretol) Chlordiazepoxide (Librium) Cholestyramine (Questran) Griseofulvin Oral Contraceptives Rifampin Sucralfate (Carafate) Vitamin K   Coumadin (Warfarin) Major Herbal Interactions  Increased Warfarin Effect Decreased Warfarin Effect  Garlic Ginseng Ginkgo biloba Coenzyme Q10 Green tea St. Johns wort    Coumadin (Warfarin) FOOD Interactions  Eat a consistent number of servings per week  of foods HIGH in Vitamin K (1 serving =  cup)  Collards (cooked, or boiled & drained) Kale (cooked, or boiled & drained) Mustard greens (cooked, or boiled & drained) Parsley *serving size only =  cup Spinach (cooked, or boiled & drained) Swiss chard (cooked, or boiled & drained) Turnip greens (cooked, or boiled & drained)  Eat a consistent number of servings per week of foods MEDIUM-HIGH in Vitamin K (1 serving = 1 cup)  Asparagus (cooked, or boiled & drained) Broccoli (cooked, boiled & drained, or raw & chopped) Brussel sprouts (cooked, or boiled & drained) *serving size only =  cup Lettuce, raw (green leaf, endive, romaine) Spinach, raw Turnip greens, raw & chopped   These websites have more information on Coumadin (warfarin):  http://www.king-russell.com/; https://www.hines.net/;

## 2015-12-08 LAB — PROTIME-INR
INR: 1.15
PROTHROMBIN TIME: 14.7 s (ref 11.4–15.2)

## 2015-12-08 MED ORDER — POTASSIUM CHLORIDE CRYS ER 20 MEQ PO TBCR
30.0000 meq | EXTENDED_RELEASE_TABLET | Freq: Two times a day (BID) | ORAL | Status: DC
  Administered 2015-12-08 – 2015-12-09 (×3): 30 meq via ORAL
  Filled 2015-12-08 (×3): qty 1

## 2015-12-08 MED ORDER — LISINOPRIL 2.5 MG PO TABS
2.5000 mg | ORAL_TABLET | Freq: Every day | ORAL | Status: DC
  Administered 2015-12-08 – 2015-12-09 (×2): 2.5 mg via ORAL
  Filled 2015-12-08: qty 1

## 2015-12-08 MED ORDER — WARFARIN SODIUM 7.5 MG PO TABS
7.5000 mg | ORAL_TABLET | Freq: Once | ORAL | Status: AC
  Administered 2015-12-08: 7.5 mg via ORAL
  Filled 2015-12-08: qty 1

## 2015-12-08 NOTE — Progress Notes (Addendum)
      [address]       [place] [id number]             [phone]      5 Days Post-Op Procedure(s) (LRB): BENTALL PROCEDURE USING 21MM ST. JUDE AORTIC VALVE CONDUIT (N/A) TRANSESOPHAGEAL ECHOCARDIOGRAM (TEE) (N/A) REPLACEMENT ASCENDING AORTA USING 28MM HEMASHIELD GRAFT (N/A) Subjective: Feels good and asking to go home.  Objective: Vital signs in last 24 hours: Temp:  [97.7 F (36.5 C)-98.6 F (37 C)] 97.7 F (36.5 C) (10/07 0607) Pulse Rate:  [67-77] 67 (10/07 0607) Cardiac Rhythm: Normal sinus rhythm (10/07 0739) Resp:  [18-19] 18 (10/07 0607) BP: (127-150)/(65-73) 150/71 (10/07 0607) SpO2:  [96 %-98 %] 98 % (10/07 0607) Weight:  [148 lb (67.1 kg)] 148 lb (67.1 kg) (10/07 0554)    Intake/Output from previous day: No intake/output data recorded. Intake/Output this shift: No intake/output data recorded.  General appearance: alert, cooperative and no distress Heart: regular rate and rhythm, S1, S2 normal, no murmur, click, rub or gallop Lungs: clear to auscultation bilaterally Abdomen: soft, non-tender; bowel sounds normal; no masses,  no organomegaly Extremities: extremities normal, atraumatic, no cyanosis or edema Wound: c/d/i without discharge  Lab Results: No results for input(s): WBC, HGB, HCT, PLT in the last 72 hours. BMET:  Recent Labs  12/06/15 0206  NA 138  K 3.4*  CL 95*  CO2 34*  GLUCOSE 107*  BUN 9  CREATININE 0.76  CALCIUM 8.8*    PT/INR:  Recent Labs  12/08/15 0254  LABPROT 14.7  INR 1.15   ABG    Component Value Date/Time   PHART 7.339 (L) 12/03/2015 1803   HCO3 22.8 12/03/2015 1803   TCO2 27 12/04/2015 1635   ACIDBASEDEF 3.0 (H) 12/03/2015 1803   O2SAT 98.0 12/03/2015 1803   CBG (last 3)  No results for input(s): GLUCAP in the last 72 hours.  Assessment/Plan: S/P Procedure(s) (LRB): BENTALL PROCEDURE USING 21MM ST. JUDE AORTIC VALVE CONDUIT (N/A) TRANSESOPHAGEAL ECHOCARDIOGRAM (TEE) (N/A) REPLACEMENT  ASCENDING AORTA USING 28MM HEMASHIELD GRAFT (N/A)   1. CV - h/o atrial fibrillation. Converted with IV amio. Lopressor 25 mg bid, and Coumadin. INR slightly increased to 1.15. She is on Coumadin 7.5 mg daily. Oral Amio.  2.  Pulmonary - Tolerating RA. Encourage incentive spirometer 3. Volume Overload - On Lasix 40 mg daily and Metazolone 5 mg daily. -1L yesterday. Replace potassium. Creatinine stable at 0.76 4.  Acute blood loss anemia - Last H and H stable   5. Thrombocytopenia-last platelets 95,000  Plan:  Possibly home today. Will discuss subtherapeutic INR and hypertension with attending.    LOS: 5 days    Sharlene Doryessa N Conte 12/08/2015   inr too low to go home If does not increase will need to add lovenox 7.5 mg coumadin today  I have seen and examined Pura Spiceonna Staib and agree with the above assessment  and plan.  Delight OvensEdward B Javiel Canepa MD [facility] [phone] Office [phone] 12/08/2015 12:28 PM

## 2015-12-08 NOTE — Progress Notes (Signed)
CARDIAC REHAB PHASE I   PRE:  Rate/Rhythm: 76 nsr  BP:  Sitting: 144/84      SaO2: 94 ra  MODE:  Ambulation: 600 ft   POST:  Rate/Rhythm: 80 nsr  BP:  Sitting: 140/80     SaO2: 96 ra 1020-1035 Patient ambulated in hallway independently. Steady gait noted. Patient may go home today. Denies complaints or need for education reinforcement. Post ambulation patient back to chair with call bell in reach.  Mardell Cragg English PayneRN, BSN 12/08/2015 10:37 AM

## 2015-12-09 LAB — PROTIME-INR
INR: 1.81
PROTHROMBIN TIME: 21.3 s — AB (ref 11.4–15.2)

## 2015-12-09 MED ORDER — WARFARIN SODIUM 5 MG PO TABS: 5.0000 mg | ORAL_TABLET | Freq: Every day | ORAL | 1 refills | Status: DC

## 2015-12-09 MED ORDER — METOPROLOL TARTRATE 25 MG PO TABS: 25.0000 mg | ORAL_TABLET | Freq: Two times a day (BID) | ORAL | 1 refills | Status: DC

## 2015-12-09 MED ORDER — AMIODARONE HCL 200 MG PO TABS: ORAL_TABLET | ORAL | 1 refills | Status: DC

## 2015-12-09 MED ORDER — LISINOPRIL 5 MG PO TABS: 5.0000 mg | ORAL_TABLET | Freq: Every day | ORAL | 1 refills | Status: DC

## 2015-12-09 MED ORDER — TRAMADOL HCL 50 MG PO TABS: 50.0000 mg | ORAL_TABLET | Freq: Four times a day (QID) | ORAL | 0 refills | Status: DC | PRN

## 2015-12-09 MED ORDER — ACETAMINOPHEN 325 MG PO TABS: 650.0000 mg | ORAL_TABLET | Freq: Four times a day (QID) | ORAL | Status: AC | PRN

## 2015-12-09 NOTE — Progress Notes (Signed)
Discharged to home with family office visits in place teaching done  

## 2015-12-09 NOTE — Progress Notes (Addendum)
      [address]       [place] [id number]             [phone]      6 Days Post-Op Procedure(s) (LRB): BENTALL PROCEDURE USING 21MM ST. JUDE AORTIC VALVE CONDUIT (N/A) TRANSESOPHAGEAL ECHOCARDIOGRAM (TEE) (N/A) REPLACEMENT ASCENDING AORTA USING 28MM HEMASHIELD GRAFT (N/A) Subjective: Feels good today. Ready to go home.   Objective: Vital signs in last 24 hours: Temp:  [97.8 F (36.6 C)-98.5 F (36.9 C)] 98.5 F (36.9 C) (10/08 0545) Pulse Rate:  [65-74] 74 (10/08 0545) Cardiac Rhythm: Normal sinus rhythm (10/08 0802) Resp:  [18] 18 (10/08 0545) BP: (142-152)/(70-72) 152/72 (10/08 0545) SpO2:  [93 %-100 %] 100 % (10/08 0545) Weight:  [145 lb 6.4 oz (66 kg)] 145 lb 6.4 oz (66 kg) (10/08 0545)     Intake/Output from previous day: 10/07 0701 - 10/08 0700 In: 600 [P.O.:600] Out: -  Intake/Output this shift: No intake/output data recorded.  General appearance: alert and no distress Heart: regular rate and rhythm, S1, S2 normal, no murmur, click, rub or gallop Lungs: clear to auscultation bilaterally Abdomen: soft, non-tender; bowel sounds normal; no masses,  no organomegaly Extremities: extremities normal, atraumatic, no cyanosis or edema Wound: c/d/i without discharge  Lab Results: No results for input(s): WBC, HGB, HCT, PLT in the last 72 hours. BMET: No results for input(s): NA, K, CL, CO2, GLUCOSE, BUN, CREATININE, CALCIUM in the last 72 hours.  PT/INR:  Recent Labs  12/09/15 0307  LABPROT 21.3*  INR 1.81   ABG    Component Value Date/Time   PHART 7.339 (L) 12/03/2015 1803   HCO3 22.8 12/03/2015 1803   TCO2 27 12/04/2015 1635   ACIDBASEDEF 3.0 (H) 12/03/2015 1803   O2SAT 98.0 12/03/2015 1803   CBG (last 3)  No results for input(s): GLUCAP in the last 72 hours.  Assessment/Plan: S/P Procedure(s) (LRB): BENTALL PROCEDURE USING 21MM ST. JUDE AORTIC VALVE CONDUIT (N/A) TRANSESOPHAGEAL ECHOCARDIOGRAM (TEE) (N/A) REPLACEMENT  ASCENDING AORTA USING 28MM HEMASHIELD GRAFT (N/A)  1. CV - h/o atrial fibrillation. Converted with IV amio. Lopressor 25mg  bid,and Coumadin. INR  increased to 1.81. She is on Coumadin 7.5mg  daily. Oral Amio.  2. Pulmonary - Tolerating RA. Encourage incentive spirometer 3. Renal-weight trending down. Last creatinine 0.76. Diuretics discontinued 4. Acute blood loss anemia - Last H and H stable   5. Thrombocytopenia-last platelets 95,000  Plan:  Possibly home today. Will discuss INR with attending and the need for Lovenox. Little effect with her hypertension with the addition of lisinopril. She doesn't take any hypertensive agents at home.    LOS: 6 days    Marissa Bailey 12/09/2015  INR now up to 1.8, home today on coumadin and 81 mg asa , follow up tomorrow in coumadin clinic for early recheck  increase dose of ace  I have seen and examined Marissa Spiceonna Bailey and agree with the above assessment  and plan.  Delight OvensEdward B Saanvika Vazques MD [facility] [phone] Office [phone] 12/09/2015 10:26 AM

## 2015-12-10 ENCOUNTER — Ambulatory Visit (INDEPENDENT_AMBULATORY_CARE_PROVIDER_SITE_OTHER): Payer: BLUE CROSS/BLUE SHIELD | Admitting: *Deleted

## 2015-12-10 DIAGNOSIS — Z952 Presence of prosthetic heart valve: Secondary | ICD-10-CM | POA: Diagnosis not present

## 2015-12-10 DIAGNOSIS — Z79899 Other long term (current) drug therapy: Secondary | ICD-10-CM | POA: Insufficient documentation

## 2015-12-10 DIAGNOSIS — Z5181 Encounter for therapeutic drug level monitoring: Secondary | ICD-10-CM

## 2015-12-10 LAB — POCT INR: INR: 2.3

## 2015-12-10 MED FILL — Heparin Sodium (Porcine) Inj 1000 Unit/ML: INTRAMUSCULAR | Qty: 2500 | Status: AC

## 2015-12-10 MED FILL — Dexmedetomidine HCl in NaCl 0.9% IV Soln 400 MCG/100ML: INTRAVENOUS | Qty: 100 | Status: AC

## 2015-12-10 NOTE — Patient Instructions (Signed)

## 2015-12-17 ENCOUNTER — Ambulatory Visit (INDEPENDENT_AMBULATORY_CARE_PROVIDER_SITE_OTHER): Payer: Self-pay

## 2015-12-17 ENCOUNTER — Ambulatory Visit (INDEPENDENT_AMBULATORY_CARE_PROVIDER_SITE_OTHER): Payer: BLUE CROSS/BLUE SHIELD | Admitting: *Deleted

## 2015-12-17 DIAGNOSIS — Z4802 Encounter for removal of sutures: Secondary | ICD-10-CM

## 2015-12-17 DIAGNOSIS — Z5181 Encounter for therapeutic drug level monitoring: Secondary | ICD-10-CM | POA: Diagnosis not present

## 2015-12-17 DIAGNOSIS — Z952 Presence of prosthetic heart valve: Secondary | ICD-10-CM

## 2015-12-17 LAB — POCT INR: INR: 1.6

## 2015-12-17 NOTE — Progress Notes (Signed)
Removed 2 sutures from chest tube incision sites with no signs of infection and patient tolerated well. 

## 2015-12-19 ENCOUNTER — Ambulatory Visit (INDEPENDENT_AMBULATORY_CARE_PROVIDER_SITE_OTHER): Payer: BLUE CROSS/BLUE SHIELD | Admitting: Cardiology

## 2015-12-19 ENCOUNTER — Encounter: Payer: Self-pay | Admitting: Cardiology

## 2015-12-19 VITALS — BP 138/60 | HR 70 | Ht 63.0 in | Wt 147.4 lb

## 2015-12-19 DIAGNOSIS — Z952 Presence of prosthetic heart valve: Secondary | ICD-10-CM | POA: Diagnosis not present

## 2015-12-19 NOTE — Patient Instructions (Addendum)
Your physician recommends that you continue on your current medications as directed. Please refer to the Current Medication list given to you today.    Your physician recommends that you schedule a follow-up appointment in: 2 MONTHS  WITH  DR  Clifton JamesMCALHANY

## 2015-12-19 NOTE — Progress Notes (Signed)
12/19/2015 Pura Spice   [birth year]  [id number]  Primary Physician Lorretta Harp, MD Primary Cardiologist: Dr. Clifton James    Reason for Visit/CC: [facility] F/u S/p AVR  HPI:  The patient is a 55 year old female, followed by Dr. Clifton James, with a history of mild MR, PACs and atypical chest pain with normal ETT in 2013. 2D Echo in 2011 showed normal LV size and function with mild MR. She was lost to follow-up for a period of time but presented back to clinic this past August to reestablish routine cardiovascular care. At that visit she was noted to have a significant cardiac murmur suggestive of aortic pathology. Subsequently a 2-D echocardiogram was arranged which suggested severe aortic insufficiency. She also underwent a nuclear stress test at that time, as she had also endorsed exertional chest discomfort. Her stress test was negative for ischemia. Given the findings of a 2-D echocardiogram, it was recommended that she undergo a TEE. This also confirmed the presence of severe aortic insufficiency. She underwent a right and left heart catheterization which showed no obstructive coronary disease. CTA of the chest demonstrated a mildly enlarged aortic root. She was referred to CT surgery and seen by Dr. Lavinia Sharps who recommended that she undergo a Bentall procedure. She was admitted on 12/03/2015 for the planned procedure. She underwent replacement of Ascending Aorta using a 28 mm Hemashield graft and a Bentall procedure with a St. Jude 21 mm Mechanical valved graft. Postoperative recovery was complicated by atrial fibrillation with RVR and she required IV amiodarone. She converted to normal sinus rhythm and was transitioned to oral amiodarone. Given her mechanical aortic valve, she was started on Coumadin for anticoagulation. She was discharged home by CT surgery on 12/09/2015.  She presents as back to clinic for post hospital follow-up. She is accompanied by her husband. EKG shows normal sinus  rhythm with lateral T-wave abnormalities, which appear to be new. However, she denies any chest pain, pressure/thightness and no dyspnea. Her vital signs are stable. She reports full compliance with Coumadin. No abnormal bleeding. No falls. She is pleased with the progress that she made.     Current Meds  Medication Sig  . acetaminophen (TYLENOL) 325 MG tablet Take 2 tablets (650 mg total) by mouth every 6 (six) hours as needed for mild pain.  Marland Kitchen amiodarone (PACERONE) 200 MG tablet Take one tab twice a day for 14 days then switch to one tab daily.  Marland Kitchen aspirin 81 MG tablet Take 81 mg by mouth at bedtime.   . Cholecalciferol (VITAMIN D) 1000 UNITS capsule Take 2,000 Units by mouth daily.   . fish oil-omega-3 fatty acids 1000 MG capsule Take 2 g by mouth daily.    Marland Kitchen glucosamine-chondroitin 500-400 MG tablet Take 1 tablet by mouth daily.  Marland Kitchen lisinopril (PRINIVIL,ZESTRIL) 5 MG tablet Take 1 tablet (5 mg total) by mouth daily.  Marland Kitchen MAGNESIUM CARBONATE PO Take 1 tablet by mouth daily.   . metoprolol tartrate (LOPRESSOR) 25 MG tablet Take 1 tablet (25 mg total) by mouth 2 (two) times daily.  . Multiple Vitamin (MULTIVITAMIN WITH MINERALS) TABS Take 1 tablet by mouth daily.  . NON FORMULARY Apply 0.5 mLs topically daily. 70/30 mix of estriol and estradiol in a cream base. Compounded by [facility].  Marland Kitchen PRESCRIPTION MEDICATION Apply 0.1 mLs topically daily. Testosterone 2% cream compounded  . progesterone (PROMETRIUM) 100 MG capsule Take 100 mg by mouth at bedtime.  . traMADol (ULTRAM) 50 MG tablet Take 1 tablet (50 mg  total) by mouth every 6 (six) hours as needed for moderate pain.  . vitamin B-12 (CYANOCOBALAMIN) 1000 MCG tablet Take 1,000 mcg by mouth daily.  . vitamin C (ASCORBIC ACID) 500 MG tablet Take 1,000 mg by mouth daily.   Marland Kitchen warfarin (COUMADIN) 5 MG tablet Take 1 tablet (5 mg total) by mouth daily.   Allergies  Allergen Reactions  . No Known Allergies    Past Medical History:    Diagnosis Date  . Anemia   . Anxiety   . Heart palpitations   . Migraines    menstrual  . Patent foramen ovale   . Seasonal allergies    Family History  Problem Relation Age of Onset  . Heart attack Mother 31    brain impairment tobacco dm  . Diabetes Mother   . Hyperlipidemia Mother   . Other Sister     whole in heart age 75  . Cancer Paternal Grandmother     breast  . Stroke Paternal Grandmother    Past Surgical History:  Procedure Laterality Date  . BENTALL PROCEDURE N/A 12/03/2015   Procedure: BENTALL PROCEDURE USING ST. JUDE AORTIC VALVE CONDUIT;  Surgeon: Alleen Borne, MD;  Location: [facility] OR;  Service: Open Heart Surgery;  Laterality: N/A;  CIRC ARREST  . CARDIAC CATHETERIZATION N/A 11/15/2015   Procedure: Right/Left Heart Cath and Coronary Angiography;  Surgeon: Kathleene Hazel, MD;  Location: [facility] INVASIVE CV LAB;  Service: Cardiovascular;  Laterality: N/A;  . DILATION AND CURETTAGE OF UTERUS    . EXPLORATORY LAPAROTOMY     for tubal pregnancy  . EYE SURGERY Bilateral    lasik  . REPLACEMENT ASCENDING AORTA N/A 12/03/2015   Procedure: REPLACEMENT ASCENDING AORTA USING HEMASHIELD GRAFT;  Surgeon: Alleen Borne, MD;  Location: [facility] OR;  Service: Open Heart Surgery;  Laterality: N/A;  . TEE WITHOUT CARDIOVERSION N/A 11/07/2015   Procedure: TRANSESOPHAGEAL ECHOCARDIOGRAM (TEE);  Surgeon: Chrystie Nose, MD;  Location[facility] ENDOSCOPY;  Service: Cardiovascular;  Laterality: N/A;  . TEE WITHOUT CARDIOVERSION N/A 12/03/2015   Procedure: TRANSESOPHAGEAL ECHOCARDIOGRAM (TEE);  Surgeon: Alleen Borne, MD;  [facility] OR;  Service: Open Heart Surgery;  Laterality: N/A;  . vein surgery lower extremity     Social History   Social History  . Marital status: Married    Spouse name: N/A  . Number of children: N/A  . Years of education: N/A   Occupational History  . Not on file.   Social History Main Topics  . Smoking status: Never Smoker  . Smokeless tobacco:  Never Used  . Alcohol use Yes     Comment: socially  . Drug use: No  . Sexual activity: Not on file   Other Topics Concern  . 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  Cardiovascular: negative for chest pain, dyspnea on exertion, edema, orthopnea, palpitations, paroxysmal nocturnal dyspnea or shortness of breath Dermatological: negative for rash Respiratory: negative for cough or wheezing Urologic: negative for hematuria Abdominal: negative for nausea, vomiting, diarrhea, bright red blood per rectum, melena, or hematemesis Neurologic: negative for visual changes, syncope, or dizziness All other systems reviewed and are otherwise negative except as  noted above.   Physical Exam:  Blood pressure 138/60, pulse 70, height 5\' 3"  (1.6 m), weight 147 lb 6.4 oz (66.9 kg).  General appearance: alert, cooperative and no distress Neck: no carotid bruit and no JVD Lungs: clear to auscultation bilaterally Heart: regular rate and rhythm and crisp mechanical valve sounds Extremities: extremities normal, atraumatic, no cyanosis or edema Pulses: 2+ and symmetric Skin: warm and dry Neurologic: Grossly normal  EKG NSR with new TWIs in inferior and lateral leads  ASSESSMENT AND PLAN:   No problem-specific Assessment & Plan notes found for this encounter.  1. Aortic Disease: h/o severe Aortic Insufficiency and dilated aortic root. S/P Bentall Procedure per Dr. Laneta SimmersBartle 12/03/15 with mechanical AV. She is recovering well. No recurrent exertional angina. No dyspnea. Still with mild pleuritic incision pain which is to be expected. She is on coumadin for mechanical Valve. INRs are followed in our Coumadin Clinic. She has f/u with Dr. Laneta SimmersBartle 01/09/16.  2. Post Operative  Atrial Fibrillation: maintaining NSR on low dose Amiodarone, 200 mg daily. QT/QTc is stable. Continue until f/u with Dr. Laneta SimmersBartle 11/8. Can discontinue if still in NSR. If recurrence of atrial fibrillation, she is protected against stroke given she is already on coumadin for mechanical aortic valve.  3. Abnormal EKG- Reviewed today's EKG with previous EKG with Dr. Delton SeeNelson, DOD. Patient denies anginal symptoms. Her pre-surgical LHC showed no CAD. In the absence of symptoms and CAD, we believe this represents post surgical changes. Dr. Delton SeeNelson has recommended repeat EKG in 8 weeks. If still abnormal and if the development of CP, consider NST.   4. HTN: BP is well controlled.   PLAN  F/u with Dr. Clifton JamesMcAlhany in 6-8 weeks.   Robbie LisBrittainy Sebastiano Luecke PA-C 12/19/2015 12:17 PM

## 2015-12-24 ENCOUNTER — Telehealth: Payer: Self-pay | Admitting: Pharmacist Clinician (PhC)/ Clinical Pharmacy Specialist

## 2015-12-24 DIAGNOSIS — Z5181 Encounter for therapeutic drug level monitoring: Secondary | ICD-10-CM | POA: Diagnosis not present

## 2015-12-24 DIAGNOSIS — Z952 Presence of prosthetic heart valve: Secondary | ICD-10-CM | POA: Diagnosis not present

## 2015-12-24 LAB — PROTIME-INR: INR: 2.1 — AB (ref 0.9–1.1)

## 2015-12-24 NOTE — Telephone Encounter (Signed)
Patient called to find INR results.  [facility].    Tried to call, their office closed at 4:30 pm, and as of this time fax has not been received.    [facility] [phone].

## 2015-12-25 ENCOUNTER — Ambulatory Visit (INDEPENDENT_AMBULATORY_CARE_PROVIDER_SITE_OTHER): Payer: BLUE CROSS/BLUE SHIELD | Admitting: Cardiovascular Disease

## 2015-12-25 DIAGNOSIS — Z5181 Encounter for therapeutic drug level monitoring: Secondary | ICD-10-CM

## 2015-12-25 DIAGNOSIS — Z952 Presence of prosthetic heart valve: Secondary | ICD-10-CM

## 2015-12-25 NOTE — Telephone Encounter (Signed)
Spoke with pt See pt coumadin encounter of today

## 2015-12-26 ENCOUNTER — Encounter: Payer: BLUE CROSS/BLUE SHIELD | Admitting: Cardiology

## 2015-12-31 ENCOUNTER — Ambulatory Visit (INDEPENDENT_AMBULATORY_CARE_PROVIDER_SITE_OTHER): Payer: BLUE CROSS/BLUE SHIELD

## 2015-12-31 DIAGNOSIS — Z952 Presence of prosthetic heart valve: Secondary | ICD-10-CM

## 2015-12-31 DIAGNOSIS — Z5181 Encounter for therapeutic drug level monitoring: Secondary | ICD-10-CM

## 2015-12-31 LAB — POCT INR: INR: 2.6

## 2016-01-03 DIAGNOSIS — D225 Melanocytic nevi of trunk: Secondary | ICD-10-CM | POA: Diagnosis not present

## 2016-01-03 DIAGNOSIS — L570 Actinic keratosis: Secondary | ICD-10-CM | POA: Diagnosis not present

## 2016-01-03 DIAGNOSIS — L853 Xerosis cutis: Secondary | ICD-10-CM | POA: Diagnosis not present

## 2016-01-03 DIAGNOSIS — L821 Other seborrheic keratosis: Secondary | ICD-10-CM | POA: Diagnosis not present

## 2016-01-03 DIAGNOSIS — Z85828 Personal history of other malignant neoplasm of skin: Secondary | ICD-10-CM | POA: Diagnosis not present

## 2016-01-08 ENCOUNTER — Other Ambulatory Visit: Payer: Self-pay | Admitting: Surgery

## 2016-01-08 DIAGNOSIS — Z952 Presence of prosthetic heart valve: Secondary | ICD-10-CM

## 2016-01-09 ENCOUNTER — Ambulatory Visit (INDEPENDENT_AMBULATORY_CARE_PROVIDER_SITE_OTHER): Payer: Self-pay | Admitting: Surgery

## 2016-01-09 ENCOUNTER — Encounter: Payer: Self-pay | Admitting: Surgery

## 2016-01-09 ENCOUNTER — Other Ambulatory Visit: Payer: Self-pay | Admitting: Physician Assistant

## 2016-01-09 ENCOUNTER — Ambulatory Visit (INDEPENDENT_AMBULATORY_CARE_PROVIDER_SITE_OTHER): Payer: BLUE CROSS/BLUE SHIELD | Admitting: Pharmacist

## 2016-01-09 ENCOUNTER — Ambulatory Visit
Admission: RE | Admit: 2016-01-09 | Discharge: 2016-01-09 | Disposition: A | Discharge status: Home / Self Care | Payer: BLUE CROSS/BLUE SHIELD | Source: Ambulatory Visit | Attending: Surgery | Admitting: Surgery

## 2016-01-09 ENCOUNTER — Other Ambulatory Visit: Payer: Self-pay | Admitting: Cardiovascular Disease

## 2016-01-09 VITALS — BP 140/62 | HR 68 | Resp 16 | Ht 63.0 in | Wt 147.0 lb

## 2016-01-09 DIAGNOSIS — Z950 Presence of cardiac pacemaker: Secondary | ICD-10-CM | POA: Diagnosis not present

## 2016-01-09 DIAGNOSIS — Z952 Presence of prosthetic heart valve: Secondary | ICD-10-CM | POA: Diagnosis not present

## 2016-01-09 DIAGNOSIS — Z5181 Encounter for therapeutic drug level monitoring: Secondary | ICD-10-CM | POA: Diagnosis not present

## 2016-01-09 DIAGNOSIS — I351 Nonrheumatic aortic (valve) insufficiency: Secondary | ICD-10-CM

## 2016-01-09 DIAGNOSIS — Z09 Encounter for follow-up examination after completed treatment for conditions other than malignant neoplasm: Secondary | ICD-10-CM

## 2016-01-09 DIAGNOSIS — I719 Aortic aneurysm of unspecified site, without rupture: Secondary | ICD-10-CM

## 2016-01-09 DIAGNOSIS — I712 Thoracic aortic aneurysm, without rupture: Secondary | ICD-10-CM

## 2016-01-09 DIAGNOSIS — I7121 Aneurysm of the ascending aorta, without rupture: Secondary | ICD-10-CM

## 2016-01-09 LAB — POCT INR: INR: 3.3

## 2016-01-09 NOTE — Progress Notes (Signed)
HPI: Patient returns for routine postoperative follow-up having undergone Bentall procedure using a 21 mm St. Jude mechanical valved graft and replacement of the ascending aorta and proximal aortic arch using a Hemashield graft on 12/03/2015. The patient's early postoperative recovery while in the hospital was notable for development of postop atrial fibrillation. She was converted with amiodarone. Since hospital discharge the patient reports that she has felt well overall. She still has some soreness in her chest, back and neck but is walking daily without pain or shortness of breath. Her IN[facility] and her INR was 3.3 today.   Current Outpatient Prescriptions  Medication Sig Dispense Refill  . acetaminophen (TYLENOL) 325 MG tablet Take 2 tablets (650 mg total) by mouth every 6 (six) hours as needed for mild pain.    Marland Kitchen. amiodarone (PACERONE) 200 MG tablet Take one tab twice a day for 14 days then switch to one tab daily. 30 tablet 1  . aspirin 81 MG tablet Take 81 mg by mouth at bedtime.     . Cholecalciferol (VITAMIN D) 1000 UNITS capsule Take 2,000 Units by mouth daily.     . fish oil-omega-3 fatty acids 1000 MG capsule Take 2 g by mouth daily.      Marland Kitchen. glucosamine-chondroitin 500-400 MG tablet Take 1 tablet by mouth daily.    Marland Kitchen. lisinopril (PRINIVIL,ZESTRIL) 5 MG tablet Take 1 tablet (5 mg total) by mouth daily. 30 tablet 1  . MAGNESIUM CARBONATE PO Take 1 tablet by mouth daily.     . Multiple Vitamin (MULTIVITAMIN WITH MINERALS) TABS Take 1 tablet by mouth daily.    . NON FORMULARY Apply 0.5 mLs topically daily. 70/30 mix of estriol and estradiol in a cream base. Compounded by [facility].    Marland Kitchen. PRESCRIPTION MEDICATION Apply 0.1 mLs topically daily. Testosterone 2% cream compounded    . progesterone (PROMETRIUM) 100 MG capsule Take 100 mg by mouth at bedtime.    . traMADol (ULTRAM) 50 MG tablet Take 1 tablet (50 mg total) by mouth every 6 (six) hours as  needed for moderate pain. 30 tablet 0  . vitamin B-12 (CYANOCOBALAMIN) 1000 MCG tablet Take 1,000 mcg by mouth daily.    . vitamin C (ASCORBIC ACID) 500 MG tablet Take 1,000 mg by mouth daily.     Marland Kitchen. warfarin (COUMADIN) 5 MG tablet Take 1 tablet (5 mg total) by mouth daily. 30 tablet 1  . metoprolol tartrate (LOPRESSOR) 25 MG tablet TAKE 1 TABLET BY MOUTH TWICE A DAY 30 tablet 1   No current facility-administered medications for this visit.     Physical Exam: BP 140/62   Pulse 68   Resp 16   Ht 5\' 3"  (1.6 m)   Wt 147 lb (66.7 kg)   SpO2 95% Comment: ON RA  BMI 26.04 kg/m  She looks well. Lung exam is clear. Cardiac exam shows a regular rate and rhythm with crisp mechanical heart sounds. Chest incision is healing well and sternum is stable. There is no peripheral edema.   Diagnostic Tests:  CLINICAL DATA:  Previous aortic valve replacement.  Follow-up.  EXAM: CHEST  2 VIEW  COMPARISON:  12/05/2015  FINDINGS: Previous median sternotomy and aortic valve replacement. Heart size is normal. The aorta shows atherosclerosis and tortuosity. The pulmonary vascularity is normal. The lungs are clear. No effusions. Ordinary mild degenerative changes affect the spine.  IMPRESSION: Good appearance following aortic valve replacement. No active disease presently.  Aortic atherosclerosis.   Electronically Signed   By: Paulina FusiMark  Shogry M.D.   On: 01/09/2016 11:35      Impression:  Overall I think she is doing well. I encouraged her to continue walking. She is planning to participate in cardiac rehab and I think she is ready for that. I told her that she could drive her car but should not lift anything heavier than 10 lbs for three months postop. 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 She appears to be  maintaining sinus rhythm so I told her that she can stop the amiodarone after her current prescription is finished.  Plan:  She will continue to follow up with cardiology and her PCP. [facility].     Alleen BorneBryan K Bartle, MD [facility] [phone]

## 2016-01-23 ENCOUNTER — Ambulatory Visit (INDEPENDENT_AMBULATORY_CARE_PROVIDER_SITE_OTHER): Payer: BLUE CROSS/BLUE SHIELD | Admitting: *Deleted

## 2016-01-23 DIAGNOSIS — Z952 Presence of prosthetic heart valve: Secondary | ICD-10-CM

## 2016-01-23 DIAGNOSIS — Z5181 Encounter for therapeutic drug level monitoring: Secondary | ICD-10-CM | POA: Diagnosis not present

## 2016-01-23 LAB — POCT INR: INR: 2.6

## 2016-02-05 ENCOUNTER — Other Ambulatory Visit: Payer: Self-pay | Admitting: Cardiology

## 2016-02-06 ENCOUNTER — Other Ambulatory Visit: Payer: Self-pay | Admitting: Cardiovascular Disease

## 2016-02-06 ENCOUNTER — Other Ambulatory Visit: Payer: Self-pay | Admitting: Physician Assistant

## 2016-02-11 ENCOUNTER — Encounter: Payer: Self-pay | Admitting: Cardiovascular Disease

## 2016-02-11 ENCOUNTER — Ambulatory Visit (INDEPENDENT_AMBULATORY_CARE_PROVIDER_SITE_OTHER): Payer: BLUE CROSS/BLUE SHIELD | Admitting: Pharmacist

## 2016-02-11 ENCOUNTER — Ambulatory Visit (INDEPENDENT_AMBULATORY_CARE_PROVIDER_SITE_OTHER): Payer: BLUE CROSS/BLUE SHIELD | Admitting: Cardiovascular Disease

## 2016-02-11 VITALS — BP 136/54 | HR 54 | Ht 63.0 in | Wt 153.0 lb

## 2016-02-11 DIAGNOSIS — Z952 Presence of prosthetic heart valve: Secondary | ICD-10-CM | POA: Diagnosis not present

## 2016-02-11 DIAGNOSIS — I712 Thoracic aortic aneurysm, without rupture, unspecified: Secondary | ICD-10-CM

## 2016-02-11 DIAGNOSIS — Z5181 Encounter for therapeutic drug level monitoring: Secondary | ICD-10-CM

## 2016-02-11 DIAGNOSIS — I351 Nonrheumatic aortic (valve) insufficiency: Secondary | ICD-10-CM | POA: Diagnosis not present

## 2016-02-11 LAB — POCT INR: INR: 2.5

## 2016-02-11 MED ORDER — WARFARIN SODIUM 5 MG PO TABS: ORAL_TABLET | ORAL | 2 refills | Status: DC

## 2016-02-11 NOTE — Patient Instructions (Signed)

## 2016-02-11 NOTE — Progress Notes (Signed)
Chief Complaint  Patient presents with  . Follow-up      History of Present Illness: 55 yo WF with history of SVT, PACs and severe aortic valve insufficiency now s/p Bentall procedure with mechanical aortic valve replacement here today for cardiac follow up. I have followed her for palpitations and valve disease. She had a normal stress test in 2013. 48 hour monitor in 2013 with NSR, PACs and several short runs of SVT (15 beats). She was seen in our office August 2017 for routine follow up. Echo 10/29/15 with normal LV function, severe aortic insufficiency. TEE on 96/17 with normal LV function, severe AI, mild MR, PFO. Cardiac cath 11/15/15 with normal coronary arteries. Dr. Laneta Simmers performed a Bentall procedure 12/03/15 with placement of a mechanical AVR with replacement of the ascending aorta and proximal aortic arch.   She is here today for follow up. She has no chest pain or dyspnea. No lower extremity edema. Her weight has been stable.   Primary Care Physician: Lorretta Harp, MD   Past Medical History:  Diagnosis Date  . Anemia   . Anxiety   . Heart palpitations   . Migraines    menstrual  . Patent foramen ovale   . Seasonal allergies     Past Surgical History:  Procedure Laterality Date  . BENTALL PROCEDURE N/A 12/03/2015   Procedure: BENTALL PROCEDURE USING ST. JUDE AORTIC VALVE CONDUIT;  Surgeon: Alleen Borne, MD;  Location: [facility] OR;  Service: Open Heart Surgery;  Laterality: N/A;  CIRC ARREST  . CARDIAC CATHETERIZATION N/A 11/15/2015   Procedure: Right/Left Heart Cath and Coronary Angiography;  Surgeon: Kathleene Hazel, MD;  Location: [facility] INVASIVE CV LAB;  Service: Cardiovascular;  Laterality: N/A;  . DILATION AND CURETTAGE OF UTERUS    . EXPLORATORY LAPAROTOMY     for tubal pregnancy  . EYE SURGERY Bilateral    lasik  . REPLACEMENT ASCENDING AORTA N/A 12/03/2015   Procedure: REPLACEMENT ASCENDING AORTA USING HEMASHIELD GRAFT;  Surgeon: Alleen Borne,  MD;  Location: [facility] OR;  Service: Open Heart Surgery;  Laterality: N/A;  . TEE WITHOUT CARDIOVERSION N/A 11/07/2015   Procedure: TRANSESOPHAGEAL ECHOCARDIOGRAM (TEE);  Surgeon: Chrystie Nose, MD;  Location: [facility] ENDOSCOPY;  Service: Cardiovascular;  Laterality: N/A;  . TEE WITHOUT CARDIOVERSION N/A 12/03/2015   Procedure: TRANSESOPHAGEAL ECHOCARDIOGRAM (TEE);  Surgeon: Alleen Borne, MD;  Location: [facility] OR;  Service: Open Heart Surgery;  Laterality: N/A;  . vein surgery lower extremity      Current Outpatient Prescriptions  Medication Sig Dispense Refill  . acetaminophen (TYLENOL) 325 MG tablet Take 2 tablets (650 mg total) by mouth every 6 (six) hours as needed for mild pain.    Marland Kitchen aspirin 81 MG tablet Take 81 mg by mouth at bedtime.     . Cholecalciferol (VITAMIN D) 1000 UNITS capsule Take 2,000 Units by mouth daily.     . fish oil-omega-3 fatty acids 1000 MG capsule Take 2 g by mouth daily.      Marland Kitchen glucosamine-chondroitin 500-400 MG tablet Take 1 tablet by mouth daily.    Marland Kitchen lisinopril (PRINIVIL,ZESTRIL) 5 MG tablet TAKE 1 TABLET BY MOUTH EVERY DAY 30 tablet 1  . MAGNESIUM CARBONATE PO Take 1 tablet by mouth daily.     . metoprolol tartrate (LOPRESSOR) 25 MG tablet TAKE 1 TABLET BY MOUTH TWICE A DAY 30 tablet 10  . Multiple Vitamin (MULTIVITAMIN WITH MINERALS) TABS Take 1 tablet by mouth daily.    Marland Kitchen  NON FORMULARY Apply 0.5 mLs topically daily. 70/30 mix of estriol and estradiol in a cream base. Compounded by [facility].    Marland Kitchen. PRESCRIPTION MEDICATION Apply 0.1 mLs topically daily. Testosterone 2% cream compounded    . progesterone (PROMETRIUM) 100 MG capsule Take 100 mg by mouth at bedtime.    . traMADol (ULTRAM) 50 MG tablet Take 1 tablet (50 mg total) by mouth every 6 (six) hours as needed for moderate pain. 30 tablet 0  . vitamin B-12 (CYANOCOBALAMIN) 1000 MCG tablet Take 1,000 mcg by mouth daily.    . vitamin C (ASCORBIC ACID) 500 MG tablet Take 1,000 mg by mouth daily.     Marland Kitchen. warfarin  (COUMADIN) 5 MG tablet Take 1 tablet (5 mg total) by mouth daily. 30 tablet 1   No current facility-administered medications for this visit.     Allergies  Allergen Reactions  . No Known Allergies     Social History   Social History  . Marital status: Married    Spouse name: N/A  . Number of children: 0  . Years of education: N/A   Occupational History  . Not on file.   Social History Main Topics  . Smoking status: Never Smoker  . Smokeless tobacco: Never Used  . Alcohol use Yes     Comment: socially  . Drug use: No  . Sexual activity: Not on file   Other Topics Concern  . 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 Heart attack Mother 946    brain impairment tobacco dm  . Diabetes Mother   . Hyperlipidemia Mother   . Heart disease Sister     whole in heart age 234  . Cancer Paternal Grandmother     breast  . Stroke Paternal Grandmother   . Heart failure Brother     Review of Systems:  As stated in the HPI and otherwise negative.   BP (!) 136/54   Pulse (!) 54   Ht 5\' 3"  (1.6 m)   Wt 153 lb (69.4 kg)   BMI 27.10 kg/m   Physical Examination: General: Well developed, well nourished, NAD  HEENT: OP clear, mucus membranes moist  SKIN: warm, dry. No rashes. Neuro: No focal deficits  Musculoskeletal: Muscle strength 5/5 all ext  Psychiatric: Mood and affect normal  Neck: No JVD, no carotid bruits, no thyromegaly, no lymphadenopathy.  Lungs:Clear bilaterally, no wheezes, rhonci, crackles Cardiovascular: Regular rate and rhythm. Diastolic murmur noted. No gallops or rubs. Abdomen:Soft. Bowel sounds present. Non-tender.  Extremities: No lower extremity edema. Pulses are 2 + in the bilateral DP/PT.  Echo 10/29/15: Left ventricle: The cavity size was normal. Systolic  function was   normal. The estimated ejection fraction was in the range of 50%   to 55%. Wall motion was normal; there were no regional wall   motion abnormalities. Left ventricular diastolic function   parameters were normal. Internal dimension, ES (PLAX chordal):   39.2 mm. - Aortic valve: A bicuspid morphology cannot be excluded; normal   thickness leaflets. There was severe regurgitation. - Aorta: Aortic root dimension: 38 mm (ED). - Ascending aorta: The ascending aorta was mildly dilated.   TEE 11/07/15: Left ventricle: The cavity size  was normal. Wall thickness was   normal. Systolic function was normal. The estimated ejection   fraction was in the range of 55% to 60%. Wall motion was normal;   there were no regional wall motion abnormalities. - Aortic valve: Dilated sinotubular junction to 4.2 cm. Trileaflet   valve. Severe regurgitation. Vena contracta measures 1.1 cm.   Regurgitation pressure half-time: 253 ms. - Mitral valve: There was mild regurgitation. - Left atrium: No evidence of thrombus in the atrial cavity or   appendage. - Right atrium: No evidence of thrombus in the atrial cavity or   appendage. - Atrial septum: There was a patent foramen ovale. Left to right,   but not right to left shunting. There is a type 1R atrial septal   aneurysm. - Pulmonic valve: No evidence of vegetation.  Impressions:  - Likely severe aortic stenosis. The preferred modality to evaluate   regurgitant volume is PISA analysis by transthoracic   echocardiogram. That being said, this TEE study does demonstrate   a dilated sinotubular junction with a normal trileaflet   structure. Evaluation for aortic valve surgery may be indicated.  EKG:  EKG is ordered today. The ekg ordered today demonstrates Sinus brady, rate 54 bpm. LVH  Recent Labs: 11/30/2015: ALT 23 12/04/2015: Magnesium 2.2 12/05/2015: Hemoglobin 12.8; Platelets 95 12/06/2015: BUN 9; Creatinine, Ser 0.76; Potassium 3.4;  Sodium 138   Lipid Panel    Component Value Date/Time   CHOL 177 09/18/2009 1621   TRIG 109.0 09/18/2009 1621   HDL 76.50 09/18/2009 1621   CHOLHDL 2 09/18/2009 1621   VLDL 21.8 09/18/2009 1621   LDLCALC 79 09/18/2009 1621     Wt Readings from Last 3 Encounters:  02/11/16 153 lb (69.4 kg)  01/09/16 147 lb (66.7 kg)  12/19/15 147 lb 6.4 oz (66.9 kg)     Other studies Reviewed: Additional studies/ records that were reviewed today include: . Review of the above records demonstrates:   Assessment and Plan:   1. Severe aortic valve insufficiency: She is s/p Bentall procedure with mechanical AVR October 2017. She is on coumadin.   2. Thoracic aortic aneurysm: s/p replacement of the ascending aorta and proximal arch October 2017.   Current medicines are reviewed at length with the patient today.  The patient does not have concerns regarding medicines.  The following changes have been made:  no change  Labs/ tests ordered today include:   Orders Placed This Encounter  Procedures  . EKG 12-Lead    Disposition:   FU with me in six months.     Signed, Verne Carrowhristopher Decker Cogdell, MD 02/11/2016 9:46 AM    [facility] [address], [place], [place]  [address] Phone: [phone]; Fax: [phone]

## 2016-02-22 ENCOUNTER — Other Ambulatory Visit: Payer: Self-pay | Admitting: *Deleted

## 2016-02-22 MED ORDER — METOPROLOL TARTRATE 25 MG PO TABS: 25.0000 mg | ORAL_TABLET | Freq: Two times a day (BID) | ORAL | 10 refills | Status: DC

## 2016-03-05 ENCOUNTER — Ambulatory Visit (INDEPENDENT_AMBULATORY_CARE_PROVIDER_SITE_OTHER): Payer: BLUE CROSS/BLUE SHIELD | Admitting: *Deleted

## 2016-03-05 DIAGNOSIS — Z5181 Encounter for therapeutic drug level monitoring: Secondary | ICD-10-CM | POA: Diagnosis not present

## 2016-03-05 DIAGNOSIS — Z952 Presence of prosthetic heart valve: Secondary | ICD-10-CM

## 2016-03-05 LAB — POCT INR: INR: 1.7

## 2016-03-19 ENCOUNTER — Encounter: Payer: Self-pay | Admitting: Physician Assistant

## 2016-03-19 NOTE — Progress Notes (Deleted)
Cardiology Office Note    Date:  03/19/2016  ID:  Marissa Bailey, DOB 11/01/1960, MRN [id number] PCP:  Lorretta HarpPANOSH,WANDA KOTVAN, MD  Cardiologist:  Dr. Clifton JamesMcAlhany   Chief Complaint: dizziness  History of Present Illness:  Marissa Bailey is a 56 y.o. female with history of SVT, PACs, migraines, PFO, severe aortic valve insufficiency s/p Bentall with mechanical aortic valve replacement 12/2015 who presents for evaluation of dizziness. 48 hour monitor in 2013 with NSR, PACs and several short runs of SVT (15 beats) in the setting of suppressed TSH. LHC 11/2015 showed normal coronaries. TEE on 11/07/15 with EF 55-60%, severe AI, mild MR, +PFO. Dr. Laneta SimmersBartle performed a Bentall procedure 12/03/15 with placement of a mechanical AVR with replacement of the ascending aorta and proximal aortic arch. Last pertinent labs showed TSH 0.09 (2013 -> instructed to f/u PCP), K 3.4, Hgb 12.8, Plt 95, Cr 0.76 in 12/2015.   TSH  Dizziness PSVT H/o PACs AI and dilated aorta s/p AVR/Bentall Abnormal TSH    Past Medical History:  Diagnosis Date  . Abnormal TSH   . Anemia   . Anxiety   . Aortic valve insufficiency    a. s/p Bentall with mechanical aortic valve replacement, with replacement of the ascending aorta and proximal aortic arch 12/2015.  . Migraines    menstrual  . Patent foramen ovale   . Premature atrial contractions   . PSVT (paroxysmal supraventricular tachycardia) (HCC)    a. 48 hour monitor in 2013 with NSR, PACs and several short runs of SVT (15 beats).  . Seasonal allergies     Past Surgical History:  Procedure Laterality Date  . BENTALL PROCEDURE N/A 12/03/2015   Procedure: BENTALL PROCEDURE USING 21MM ST. JUDE AORTIC VALVE CONDUIT;  Surgeon: Alleen BorneBryan K Bartle, MD;  Location: [facility];  Service: Open Heart Surgery;  Laterality: N/A;  CIRC ARREST  . CARDIAC CATHETERIZATION N/A 11/15/2015   Procedure: Right/Left Heart Cath and Coronary Angiography;  Surgeon: Kathleene Hazelhristopher D McAlhany, MD;  [facility];  Service: Cardiovascular;  Laterality: N/A;  . DILATION AND CURETTAGE OF UTERUS    . EXPLORATORY LAPAROTOMY     for tubal pregnancy  . EYE SURGERY Bilateral    lasik  . REPLACEMENT ASCENDING AORTA N/A 12/03/2015   Procedure: REPLACEMENT ASCENDING AORTA USING 28MM HEMASHIELD GRAFT;  Surgeon: Alleen BorneBryan K Bartle, MD;  Location: [facility] OR;  Service: Open Heart Surgery;  Laterality: N/A;  . TEE WITHOUT CARDIOVERSION N/A 11/07/2015   Procedure: TRANSESOPHAGEAL ECHOCARDIOGRAM (TEE);  Surgeon: Chrystie NoseKenneth C Hilty, MD;  [facility];  Service: Cardiovascular;  Laterality: N/A;  . TEE WITHOUT CARDIOVERSION N/A 12/03/2015   Procedure: TRANSESOPHAGEAL ECHOCARDIOGRAM (TEE);  Surgeon: Alleen BorneBryan K Bartle, MD;  [facility];  Service: Open Heart Surgery;  Laterality: N/A;  . vein surgery lower extremity      Current Medications: Current Outpatient Prescriptions  Medication Sig Dispense Refill  . acetaminophen (TYLENOL) 325 MG tablet Take 2 tablets (650 mg total) by mouth every 6 (six) hours as needed for mild pain.    Marland Kitchen. aspirin 81 MG tablet Take 81 mg by mouth at bedtime.     . Cholecalciferol (VITAMIN D) 1000 UNITS capsule Take 2,000 Units by mouth daily.     . fish oil-omega-3 fatty acids 1000 MG capsule Take 2 g by mouth daily.      Marland Kitchen. glucosamine-chondroitin 500-400 MG tablet Take 1 tablet by mouth daily.    Marland Kitchen. lisinopril (PRINIVIL,ZESTRIL) 5 MG tablet TAKE 1  TABLET BY MOUTH EVERY DAY 30 tablet 1  . MAGNESIUM CARBONATE PO Take 1 tablet by mouth daily.     . metoprolol tartrate (LOPRESSOR) 25 MG tablet Take 1 tablet (25 mg total) by mouth 2 (two) times daily. 60 tablet 10  . Multiple Vitamin (MULTIVITAMIN WITH MINERALS) TABS Take 1 tablet by mouth daily.    . NON FORMULARY Apply 0.5 mLs topically daily. 70/30 mix of estriol and estradiol in a cream base. Compounded by [facility].    Marland Kitchen PRESCRIPTION MEDICATION Apply 0.1 mLs topically daily. Testosterone 2% cream compounded    .  progesterone (PROMETRIUM) 100 MG capsule Take 100 mg by mouth at bedtime.    . traMADol (ULTRAM) 50 MG tablet Take 1 tablet (50 mg total) by mouth every 6 (six) hours as needed for moderate pain. 30 tablet 0  . vitamin B-12 (CYANOCOBALAMIN) 1000 MCG tablet Take 1,000 mcg by mouth daily.    . vitamin C (ASCORBIC ACID) 500 MG tablet Take 1,000 mg by mouth daily.     Marland Kitchen warfarin (COUMADIN) 5 MG tablet Take as directed by Coumadin clinic. 30 tablet 2   No current facility-administered medications for this visit.      Allergies:   No known allergies   Social History   Social History  . Marital status: Married    Spouse name: N/A  . Number of children: 0  . Years of education: N/A   Social History Main Topics  . Smoking status: Never Smoker  . Smokeless tobacco: Never Used  . Alcohol use Yes     Comment: socially  . Drug use: No  . Sexual activity: Not on file   Other Topics Concern  . 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 ***  ROS:   Please see the history of present illness. Otherwise, review of systems is positive for ***.  All other systems are reviewed and otherwise negative.    PHYSICAL EXAM:   VS:  There were no vitals taken for this visit.  BMI: There is no height or weight on file to calculate BMI. GEN: Well nourished, well developed, in no acute distress  HEENT: normocephalic, atraumatic Neck: no JVD, carotid bruits, or masses Cardiac: ***RRR; no murmurs, rubs, or gallops, no edema  Respiratory:  clear to auscultation bilaterally, normal  work of breathing GI: soft, nontender, nondistended, + BS MS: no deformity or atrophy  Skin: warm and dry, no rash Neuro:  Alert and Oriented x 3, Strength and sensation are intact, follows commands Psych: euthymic mood, full affect  Wt Readings from Last 3 Encounters:  02/11/16 153 lb (69.4 kg)  01/09/16 147 lb (66.7 kg)  12/19/15 147 lb 6.4 oz (66.9 kg)      Studies/Labs Reviewed:   EKG:  EKG was ordered today and personally reviewed by me and demonstrates *** EKG was not ordered today.***  Recent Labs: 11/30/2015: ALT 23 12/04/2015: Magnesium 2.2 12/05/2015: Hemoglobin 12.8; Platelets 95 12/06/2015: BUN 9; Creatinine, Ser 0.76; Potassium  3.4; Sodium 138   Lipid Panel    Component Value Date/Time   CHOL 177 09/18/2009 1621   TRIG 109.0 09/18/2009 1621   HDL 76.50 09/18/2009 1621   CHOLHDL 2 09/18/2009 1621   VLDL 21.8 09/18/2009 1621   LDLCALC 79 09/18/2009 1621    Additional studies/ records that were reviewed today include: Summarized above.***    ASSESSMENT & PLAN:   1. ***  Disposition: F/u with ***   Medication Adjustments/Labs and Tests Ordered: Current medicines are reviewed at length with the patient today.  Concerns regarding medicines are outlined above. Medication changes, Labs and Tests ordered today are summarized above and listed in the Patient Instructions accessible in Encounters.   Thomasene Mohair PA-C  03/19/2016 12:26 PM    [facility] [address] [place], [place], [place]  [address] Phone: [phone]; Fax: [phone]

## 2016-03-20 ENCOUNTER — Ambulatory Visit: Payer: BLUE CROSS/BLUE SHIELD | Admitting: Physician Assistant

## 2016-03-25 ENCOUNTER — Ambulatory Visit (INDEPENDENT_AMBULATORY_CARE_PROVIDER_SITE_OTHER): Payer: BLUE CROSS/BLUE SHIELD | Admitting: *Deleted

## 2016-03-25 ENCOUNTER — Ambulatory Visit: Payer: BLUE CROSS/BLUE SHIELD | Admitting: Physician Assistant

## 2016-03-25 DIAGNOSIS — Z952 Presence of prosthetic heart valve: Secondary | ICD-10-CM

## 2016-03-25 DIAGNOSIS — Z5181 Encounter for therapeutic drug level monitoring: Secondary | ICD-10-CM

## 2016-03-25 LAB — POCT INR: INR: 1.7

## 2016-03-25 NOTE — Progress Notes (Deleted)
Cardiology Office Note    Date:  03/25/2016   ID:  Marissa Bailey, DOB Jun 25, 1960, MRN [id number]  PCP:  Lorretta HarpPANOSH,WANDA KOTVAN, MD  Cardiologist:   No chief complaint on file.   History of Present Illness:  Marissa Bailey is a 56 y.o. female  with history of SVT, PACs and severe aortic valve insufficiency now s/p Bentall procedure with mechanical aortic valve replacement here today for cardiac follow up. I have followed her for palpitations and valve disease. She had a normal stress test in 2013. 48 hour monitor in 2013 with NSR, PACs and several short runs of SVT (15 beats). She was seen in our office August 2017 for routine follow up. Echo 10/29/15 with normal LV function, severe aortic insufficiency. TEE on 96/17 with normal LV function, severe AI, mild MR, PFO. Cardiac cath 11/15/15 with normal coronary arteries. Dr. Laneta SimmersBartle performed a Bentall procedure 12/03/15 with placement of a mechanical AVR with replacement of the ascending aorta and proximal aortic arch.        Past Medical History:  Diagnosis Date  . Abnormal TSH   . Anemia   . Anxiety   . Aortic valve insufficiency    a. s/p Bentall with mechanical aortic valve replacement, with replacement of the ascending aorta and proximal aortic arch 12/2015.  . Migraines    menstrual  . Patent foramen ovale   . Premature atrial contractions   . PSVT (paroxysmal supraventricular tachycardia) (HCC)    a. 48 hour monitor in 2013 with NSR, PACs and several short runs of SVT (15 beats).  . Seasonal allergies     Past Surgical History:  Procedure Laterality Date  . BENTALL PROCEDURE N/A 12/03/2015   Procedure: BENTALL PROCEDURE USING 21MM ST. JUDE AORTIC VALVE CONDUIT;  Surgeon: Alleen BorneBryan K Bartle, MD;  [facility];  Service: Open Heart Surgery;  Laterality: N/A;  CIRC ARREST  . CARDIAC CATHETERIZATION N/A 11/15/2015   Procedure: Right/Left Heart Cath and Coronary Angiography;  Surgeon: Kathleene Hazelhristopher D McAlhany, MD;  [facility];  Service: Cardiovascular;  Laterality: N/A;  . DILATION AND CURETTAGE OF UTERUS    . EXPLORATORY LAPAROTOMY     for tubal pregnancy  . EYE SURGERY Bilateral    lasik  . REPLACEMENT ASCENDING AORTA N/A 12/03/2015   Procedure: REPLACEMENT ASCENDING AORTA USING 28MM HEMASHIELD GRAFT;  Surgeon: Alleen BorneBryan K Bartle, MD;  [facility];  Service: Open Heart Surgery;  Laterality: N/A;  . TEE WITHOUT CARDIOVERSION N/A 11/07/2015   Procedure: TRANSESOPHAGEAL ECHOCARDIOGRAM (TEE);  Surgeon: Chrystie NoseKenneth C Hilty, MD;  [facility];  Service: Cardiovascular;  Laterality: N/A;  . TEE WITHOUT CARDIOVERSION N/A 12/03/2015   Procedure: TRANSESOPHAGEAL ECHOCARDIOGRAM (TEE);  Surgeon: Alleen BorneBryan K Bartle, MD;  [facility];  Service: Open Heart Surgery;  Laterality: N/A;  . vein surgery lower extremity      Current Medications: Outpatient Medications Prior to Visit  Medication Sig Dispense Refill  . acetaminophen (TYLENOL) 325 MG tablet Take 2 tablets (650 mg total) by mouth every 6 (six) hours as needed for mild pain.    Marland Kitchen. aspirin 81 MG tablet Take 81 mg by mouth at bedtime.     . Cholecalciferol (VITAMIN D) 1000 UNITS capsule Take 2,000 Units by mouth daily.     . fish oil-omega-3 fatty acids 1000 MG capsule Take 2 g by mouth daily.      Marland Kitchen. glucosamine-chondroitin 500-400 MG tablet Take 1 tablet by mouth daily.    Marland Kitchen. lisinopril (  PRINIVIL,ZESTRIL) 5 MG tablet TAKE 1 TABLET BY MOUTH EVERY DAY 30 tablet 1  . MAGNESIUM CARBONATE PO Take 1 tablet by mouth daily.     . metoprolol tartrate (LOPRESSOR) 25 MG tablet Take 1 tablet (25 mg total) by mouth 2 (two) times daily. 60 tablet 10  . Multiple Vitamin (MULTIVITAMIN WITH MINERALS) TABS Take 1 tablet by mouth daily.    . NON FORMULARY Apply 0.5 mLs topically daily. 70/30 mix of estriol and estradiol in a cream base. Compounded by [facility].    Marland Kitchen PRESCRIPTION MEDICATION Apply 0.1 mLs topically daily. Testosterone 2% cream compounded    . progesterone  (PROMETRIUM) 100 MG capsule Take 100 mg by mouth at bedtime.    . traMADol (ULTRAM) 50 MG tablet Take 1 tablet (50 mg total) by mouth every 6 (six) hours as needed for moderate pain. 30 tablet 0  . vitamin B-12 (CYANOCOBALAMIN) 1000 MCG tablet Take 1,000 mcg by mouth daily.    . vitamin C (ASCORBIC ACID) 500 MG tablet Take 1,000 mg by mouth daily.     Marland Kitchen warfarin (COUMADIN) 5 MG tablet Take as directed by Coumadin clinic. 30 tablet 2   No facility-administered medications prior to visit.      Allergies:   No known allergies   Social History   Social History  . Marital status: Married    Spouse name: N/A  . Number of children: 0  . Years of education: N/A   Social History Main Topics  . Smoking status: Never Smoker  . Smokeless tobacco: Never Used  . Alcohol use Yes     Comment: socially  . Drug use: No  . Sexual activity: Not on file   Other Topics Concern  . 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   ROS:   Please see the history of present illness.    ROS All other systems reviewed and are negative.   PHYSICAL EXAM:   VS:  There were no vitals taken for this visit.  Physical Exam  GEN: Well nourished, well developed, in no acute distress HEENT: normal Neck: no JVD, carotid bruits, or masses Cardiac:RRR; no murmurs, rubs, or gallops  Respiratory:  clear to auscultation bilaterally, normal work of breathing GI: soft, nontender, nondistended, + BS Ext: without cyanosis, clubbing, or edema, Good distal pulses bilaterally MS: no deformity  or atrophy Skin: warm and dry, no rash Neuro:  Alert and Oriented x 3, Strength and sensation are intact Psych: euthymic mood, full affect  Wt Readings from Last 3 Encounters:  02/11/16 153 lb (69.4 kg)  01/09/16 147 lb (66.7 kg)  12/19/15 147 lb 6.4 oz (66.9 kg)      Studies/Labs Reviewed:   EKG:  EKG is*** ordered today.  The ekg ordered today demonstrates ***  Recent Labs: 11/30/2015: ALT 23 12/04/2015: Magnesium 2.2 12/05/2015: Hemoglobin 12.8; Platelets 95 12/06/2015: BUN 9; Creatinine, Ser 0.76; Potassium 3.4; Sodium 138   Lipid Panel    Component Value Date/Time  CHOL 177 09/18/2009 1621   TRIG 109.0 09/18/2009 1621   HDL 76.50 09/18/2009 1621   CHOLHDL 2 09/18/2009 1621   VLDL 21.8 09/18/2009 1621   LDLCALC 79 09/18/2009 1621    Additional studies/ records that were reviewed today include:  Echo 10/29/15: Left ventricle: The cavity size was normal. Systolic function was   normal. The estimated ejection fraction was in the range of 50%   to 55%. Wall motion was normal; there were no regional wall   motion abnormalities. Left ventricular diastolic function   parameters were normal. Internal dimension, ES (PLAX chordal):   39.2 mm. - Aortic valve: A bicuspid morphology cannot be excluded; normal   thickness leaflets. There was severe regurgitation. - Aorta: Aortic root dimension: 38 mm (ED). - Ascending aorta: The ascending aorta was mildly dilated.    TEE 11/07/15: Left ventricle: The cavity size was normal. Wall thickness was   normal. Systolic function was normal. The estimated ejection   fraction was in the range of 55% to 60%. Wall motion was normal;   there were no regional wall motion abnormalities. - Aortic valve: Dilated sinotubular junction to 4.2 cm. Trileaflet   valve. Severe regurgitation. Vena contracta measures 1.1 cm.   Regurgitation pressure half-time: 253 ms. - Mitral valve: There was mild regurgitation. - Left atrium: No evidence of thrombus in  the atrial cavity or   appendage. - Right atrium: No evidence of thrombus in the atrial cavity or   appendage. - Atrial septum: There was a patent foramen ovale. Left to right,   but not right to left shunting. There is a type 1R atrial septal   aneurysm. - Pulmonic valve: No evidence of vegetation.   Impressions:   - Likely severe aortic stenosis. The preferred modality to evaluate   regurgitant volume is PISA analysis by transthoracic   echocardiogram. That being said, this TEE study does demonstrate   a dilated sinotubular junction with a normal trileaflet   structure. Evaluation for aortic valve surgery may be indicated.      ASSESSMENT:    No diagnosis found.   PLAN:  In order of problems listed above:      Medication Adjustments/Labs and Tests Ordered: Current medicines are reviewed at length with the patient today.  Concerns regarding medicines are outlined above.  Medication changes, Labs and Tests ordered today are listed in the Patient Instructions below. There are no Patient Instructions on file for this visit.   Elson Clan, PA-C  03/25/2016 1:47 PM    [facility] [address] [place], [place], [place]  [address] Phone: [phone]; Fax: [phone]

## 2016-03-31 ENCOUNTER — Ambulatory Visit (INDEPENDENT_AMBULATORY_CARE_PROVIDER_SITE_OTHER): Payer: BLUE CROSS/BLUE SHIELD | Admitting: Physician Assistant

## 2016-03-31 ENCOUNTER — Encounter: Payer: Self-pay | Admitting: Physician Assistant

## 2016-03-31 VITALS — BP 170/70 | HR 63 | Ht 63.0 in | Wt 154.0 lb

## 2016-03-31 DIAGNOSIS — Z952 Presence of prosthetic heart valve: Secondary | ICD-10-CM

## 2016-03-31 DIAGNOSIS — R42 Dizziness and giddiness: Secondary | ICD-10-CM | POA: Insufficient documentation

## 2016-03-31 DIAGNOSIS — Z95828 Presence of other vascular implants and grafts: Secondary | ICD-10-CM

## 2016-03-31 MED ORDER — LISINOPRIL 10 MG PO TABS: 10.0000 mg | ORAL_TABLET | Freq: Every day | ORAL | 3 refills | Status: DC

## 2016-03-31 NOTE — Progress Notes (Signed)
Cardiology Office Note    Date:  03/31/2016   ID:  Marissa Bailey, DOB Feb 21, 1961, MRN [id number]  PCP:  Lorretta Harp, MD  Cardiologist: Dr. Clifton James  Chief Complaint  Patient presents with  . Dizziness    History of Present Illness:  Marissa Bailey is a 56 y.o. female with history of SVT, PACs and severe aortic valve insufficiency now s/p Bentall procedure with mechanical aortic valve replacement .She had a normal stress test in 2013. 48 hour monitor in 2013 with NSR, PACs and several short runs of SVT (15 beats). She was seen in our office August 2017 for routine follow up. Echo 10/29/15 with normal LV function, severe aortic insufficiency. TEE on 96/17 with normal LV function, severe AI, mild MR, PFO. Cardiac cath 11/15/15 with normal coronary arteries. Dr. Laneta Simmers performed a Bentall procedure 12/03/15 with placement of a mechanical AVR with replacement of the ascending aorta and proximal aortic arch.   Having more migraines since she started back to work. More aura visual changes with seeing lines and vibrations. She takes tylenol and it goes away.  At work got up to go to lunch and felt dizzy, sat down and it passed. A week later getting ready to eat lunch and became dizzy while standing. She ate a banana which helped. BP up today and up once at home 158 but doesn't check it often. No bleeding problems on Coumadin. Says her heart rate not beating fast like when she had palpitations and SVT in the past.No History of diabetes.      Past Medical History:  Diagnosis Date  . Abnormal TSH   . Anemia   . Anxiety   . Aortic valve insufficiency    a. s/p Bentall with mechanical aortic valve replacement, with replacement of the ascending aorta and proximal aortic arch 12/2015.  . Migraines    menstrual  . Patent foramen ovale   . Premature atrial contractions   . PSVT (paroxysmal supraventricular tachycardia) (HCC)    a. 48 hour monitor in 2013 with NSR, PACs and several short runs  of SVT (15 beats).  . Seasonal allergies     Past Surgical History:  Procedure Laterality Date  . BENTALL PROCEDURE N/A 12/03/2015   Procedure: BENTALL PROCEDURE USING [facility] AORTIC VALVE CONDUIT;  Surgeon: Alleen Borne, MD;  Location: [facility] OR;  Service: Open Heart Surgery;  Laterality: N/A;  CIRC ARREST  . CARDIAC CATHETERIZATION N/A 11/15/2015   Procedure: Right/Left Heart Cath and Coronary Angiography;  Surgeon: Kathleene Hazel, MD;  Location[facility] INVASIVE CV LAB;  Service: Cardiovascular;  Laterality: N/A;  . DILATION AND CURETTAGE OF UTERUS    . EXPLORATORY LAPAROTOMY     for tubal pregnancy  . EYE SURGERY Bilateral    lasik  . REPLACEMENT ASCENDING AORTA N/A 12/03/2015   Procedure: REPLACEMENT ASCENDING AORTA USING HEMASHIELD GRAFT;  Surgeon: Alleen Borne, MD;  Location: [facility] OR;  Service: Open Heart Surgery;  Laterality: N/A;  . TEE WITHOUT CARDIOVERSION N/A 11/07/2015   Procedure: TRANSESOPHAGEAL ECHOCARDIOGRAM (TEE);  Surgeon: Chrystie Nose, MD;  Location: [facility] ENDOSCOPY;  Service: Cardiovascular;  Laterality: N/A;  . TEE WITHOUT CARDIOVERSION N/A 12/03/2015   Procedure: TRANSESOPHAGEAL ECHOCARDIOGRAM (TEE);  Surgeon: Alleen Borne, MD;  Location: [facility] OR;  Service: Open Heart Surgery;  Laterality: N/A;  . vein surgery lower extremity      Current Medications: Outpatient Medications Prior to Visit  Medication Sig Dispense Refill  . acetaminophen (TYLENOL) 325  MG tablet Take 2 tablets (650 mg total) by mouth every 6 (six) hours as needed for mild pain.    Marland Kitchen aspirin 81 MG tablet Take 81 mg by mouth at bedtime.     . Cholecalciferol (VITAMIN D) 1000 UNITS capsule Take 2,000 Units by mouth daily.     . fish oil-omega-3 fatty acids 1000 MG capsule Take 2 g by mouth daily.      Marland Kitchen glucosamine-chondroitin 500-400 MG tablet Take 1 tablet by mouth daily.    Marland Kitchen MAGNESIUM CARBONATE PO Take 1 tablet by mouth daily.     . metoprolol tartrate (LOPRESSOR) 25 MG tablet Take 1  tablet (25 mg total) by mouth 2 (two) times daily. 60 tablet 10  . Multiple Vitamin (MULTIVITAMIN WITH MINERALS) TABS Take 1 tablet by mouth daily.    . NON FORMULARY Apply 0.5 mLs topically daily. 70/30 mix of estriol and estradiol in a cream base. Compounded by [facility].    Marland Kitchen PRESCRIPTION MEDICATION Apply 0.1 mLs topically daily. Testosterone 2% cream compounded    . progesterone (PROMETRIUM) 100 MG capsule Take 100 mg by mouth at bedtime.    . traMADol (ULTRAM) 50 MG tablet Take 1 tablet (50 mg total) by mouth every 6 (six) hours as needed for moderate pain. 30 tablet 0  . vitamin B-12 (CYANOCOBALAMIN) 1000 MCG tablet Take 1,000 mcg by mouth daily.    . vitamin C (ASCORBIC ACID) 500 MG tablet Take 1,000 mg by mouth daily.     Marland Kitchen warfarin (COUMADIN) 5 MG tablet Take as directed by Coumadin clinic. 30 tablet 2  . lisinopril (PRINIVIL,ZESTRIL) 5 MG tablet TAKE 1 TABLET BY MOUTH EVERY DAY 30 tablet 1   No facility-administered medications prior to visit.      Allergies:   No known allergies   Social History   Social History  . Marital status: Married    Spouse name: N/A  . Number of children: 0  . Years of education: N/A   Social History Main Topics  . Smoking status: Never Smoker  . Smokeless tobacco: Never Used  . Alcohol use Yes     Comment: socially  . Drug use: No  . Sexual activity: Not on file   Other Topics Concern  . 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   ROS:   Please see the history of present illness.    Review of Systems    Constitution: Negative.  HENT: Negative.   Eyes: Positive for visual disturbance.  Cardiovascular: Negative.   Respiratory: Negative.   Hematologic/Lymphatic: Negative.   Musculoskeletal: Positive for back pain. Negative for joint pain.  Gastrointestinal: Negative.   Genitourinary: Negative.   Neurological: Positive for dizziness.   All other systems reviewed and are negative.   PHYSICAL EXAM:   VS:  BP (!) 170/70   Pulse 63   Ht 5\' 3"  (1.6 m)   Wt 154 lb (69.9 kg)   SpO2 98%   BMI 27.28 kg/m   Physical  Exam  GEN: Well nourished, well developed, in no acute distress  Neck: no JVD, carotid bruits, or masses Cardiac:RRR;Crisp Valvular clicks Respiratory:  clear to auscultation bilaterally, normal work of breathing GI: soft, nontender, nondistended, + BS Ext: without cyanosis, clubbing, or edema, Good distal pulses bilaterally Psych: euthymic mood, full affect  Wt Readings from Last 3 Encounters:  03/31/16 154 lb (69.9 kg)  02/11/16 153 lb (69.4 kg)  01/09/16 147 lb (66.7 kg)      Studies/Labs Reviewed:   EKG:  EKG is ordered today.  The ekg ordered today demonstrates  Sinus bradycardia at 54 bpm nonspecific ST-T wave changes, no acute change. ST changes much at her than last EKG 12/19/15 Recent Labs: 11/30/2015: ALT 23 12/04/2015: Magnesium 2.2 12/05/2015: Hemoglobin 12.8; Platelets 95 12/06/2015: BUN 9; Creatinine, Ser 0.76; Potassium 3.4; Sodium 138   Lipid Panel    Component Value Date/Time   CHOL 177 09/18/2009 1621   TRIG 109.0 09/18/2009 1621   HDL 76.50 09/18/2009 1621   CHOLHDL 2 09/18/2009 1621   VLDL 21.8 09/18/2009 1621   LDLCALC 79 09/18/2009 1621    Additional studies/ records that were reviewed today include:  Echo 10/29/15: Left ventricle: The cavity size was normal. Systolic function was   normal. The estimated ejection fraction was in the range of 50%   to 55%. Wall motion was normal; there were no regional wall   motion abnormalities. Left  ventricular diastolic function   parameters were normal. Internal dimension, ES (PLAX chordal):   39.2 mm. - Aortic valve: A bicuspid morphology cannot be excluded; normal   thickness leaflets. There was severe regurgitation. - Aorta: Aortic root dimension: 38 mm (ED). - Ascending aorta: The ascending aorta was mildly dilated.    TEE 11/07/15: Left ventricle: The cavity size was normal. Wall thickness was   normal. Systolic function was normal. The estimated ejection   fraction was in the range of 55% to 60%. Wall motion was normal;   there were no regional wall motion abnormalities. - Aortic valve: Dilated sinotubular junction to 4.2 cm. Trileaflet   valve. Severe regurgitation. Vena contracta measures 1.1 cm.   Regurgitation pressure half-time: 253 ms. - Mitral valve: There was mild regurgitation. - Left atrium: No evidence of thrombus in the atrial cavity or   appendage. - Right atrium: No evidence of thrombus in the atrial cavity or   appendage. - Atrial septum: There was a patent foramen ovale. Left to right,   but not right to left shunting. There is a type 1R atrial septal   aneurysm. - Pulmonic valve: No evidence of vegetation.   Impressions:   - Likely severe aortic stenosis. The preferred modality to evaluate   regurgitant volume is PISA analysis by transthoracic   echocardiogram. That being said, this TEE study does demonstrate   a dilated sinotubular junction with a normal trileaflet   structure. Evaluation for aortic valve surgery may be indicated.      ASSESSMENT:    1. Dizziness   2. S/P AVR   3. Hx of ascending aorta replacement      PLAN:  In order of problems listed above:  Dizziness patient's blood pressure was elevated when she first got here and it's been up at home. She is also having aura's similar to her migraines in the past. She is not orthostatic. She does not think she is having SVT or arrhythmias. Will increase lisinopril to 10 mg once daily.  Also check blood work to make  sure her electrolytes are stable and CBC on Coumadin. I will see her back in one month.  Status post aVR 12/2015 on Coumadin  Status post thoracic aortic aneurysm replacement 12/2015    Medication Adjustments/Labs and Tests Ordered: Current medicines are reviewed at length with the patient today.  Concerns regarding medicines are outlined above.  Medication changes, Labs and Tests ordered today are listed in the Patient Instructions below. Patient Instructions  Medication Instructions:   START TAKING LISINOPRIL 10 MG ONCE A DAY    If you need a refill on your cardiac medications before your next appointment, please call your pharmacy.  Labwork:  CBC AND BMET TODAY    Testing/Procedures: NONE ORDERED  TODAY    Follow-Up: IN ONE MONTH WITH Mikell Kazlauskas    Any Other Special Instructions Will Be Listed Below (If Applicable).                                                                                                                                                      Elson ClanSigned, Mathilda Maguire, PA-C  03/31/2016 12:03 PM    [facility] [address], [place], [place]  [address] Phone: [phone]; Fax: [phone]

## 2016-03-31 NOTE — Patient Instructions (Addendum)
Medication Instructions:   START TAKING LISINOPRIL 10 MG ONCE A DAY    If you need a refill on your cardiac medications before your next appointment, please call your pharmacy.  Labwork:  CBC AND BMET TODAY    Testing/Procedures: NONE ORDERED  TODAY    Follow-Up: IN ONE MONTH WITH LENZE    Any Other Special Instructions Will Be Listed Below (If Applicable).

## 2016-04-01 ENCOUNTER — Telehealth: Payer: Self-pay | Admitting: Internal Medicine

## 2016-04-01 LAB — CBC
Hematocrit: 37.4 % (ref 34.0–46.6)
Hemoglobin: 12.8 g/dL (ref 11.1–15.9)
MCH: 30.3 pg (ref 26.6–33.0)
MCHC: 34.2 g/dL (ref 31.5–35.7)
MCV: 88 fL (ref 79–97)
PLATELETS: 263 10*3/uL (ref 150–379)
RBC: 4.23 x10E6/uL (ref 3.77–5.28)
RDW: 14.3 % (ref 12.3–15.4)
WBC: 6.3 10*3/uL (ref 3.4–10.8)

## 2016-04-01 LAB — BASIC METABOLIC PANEL
BUN/Creatinine Ratio: 18 (ref 9–23)
BUN: 17 mg/dL (ref 6–24)
CALCIUM: 9.8 mg/dL (ref 8.7–10.2)
CHLORIDE: 99 mmol/L (ref 96–106)
CO2: 24 mmol/L (ref 18–29)
Creatinine, Ser: 0.97 mg/dL (ref 0.57–1.00)
GFR calc Af Amer: 75 mL/min/{1.73_m2} (ref >59)
GFR calc non Af Amer: 65 mL/min/{1.73_m2} (ref >59)
Glucose: 68 mg/dL (ref 65–99)
POTASSIUM: 4.3 mmol/L (ref 3.5–5.2)
Sodium: 140 mmol/L (ref 134–144)

## 2016-04-01 NOTE — Telephone Encounter (Signed)
Patient is traveling to [place] next week and would like to get a RX for travelers diarrhea.  Contact Info: [phone]  Pharmacy: [facility] [address]

## 2016-04-02 MED ORDER — CIPROFLOXACIN HCL 500 MG PO TABS: 500.0000 mg | ORAL_TABLET | Freq: Two times a day (BID) | ORAL | 0 refills | Status: DC

## 2016-04-02 NOTE — Telephone Encounter (Signed)
Pt called back and advised pt of Dr Fabian SharpPanosh message.  Pt verbalized understanding about Rx and the possible change in coum effects if taken together.  Pt aware Rx called in.

## 2016-04-02 NOTE — Telephone Encounter (Signed)
Left a message for a return call.

## 2016-04-02 NOTE — Telephone Encounter (Signed)
I  send in  cipro 500 mg 1 po bid disp 6 if needed for travelers diarrhea  Could change coumadin effect  Temporarily if has to take this.

## 2016-04-08 ENCOUNTER — Ambulatory Visit (INDEPENDENT_AMBULATORY_CARE_PROVIDER_SITE_OTHER): Payer: BLUE CROSS/BLUE SHIELD

## 2016-04-08 ENCOUNTER — Telehealth: Payer: Self-pay

## 2016-04-08 DIAGNOSIS — Z952 Presence of prosthetic heart valve: Secondary | ICD-10-CM | POA: Diagnosis not present

## 2016-04-08 DIAGNOSIS — Z5181 Encounter for therapeutic drug level monitoring: Secondary | ICD-10-CM

## 2016-04-08 LAB — POCT INR: INR: 2.1

## 2016-04-08 NOTE — Telephone Encounter (Signed)
Will route to Dr. Sanjuana KavaMcAlhaney for any specific recommendations or concerns.

## 2016-04-08 NOTE — Telephone Encounter (Signed)
Spoke with patient and reviewed Dr. Gibson RampMcAlhany's advice with her. She asked additional questions about when she would be able to SCUBA and if she can have a massage. She states she is going on a trip to [place] tomorrow. She states Dr. Laneta SimmersBartle told her at last ov on 01/09/16 that she would eventually be able to SCUBA again but no time frame was indicated. I reviewed his d/c instructions with her and advised that she is now clear to lift greater than 10 lbs. I advised that she continue to be cautious with putting any additional weight on her sternum and advised for specific time frame that she call Dr. Sharee PimpleBartle's office. I reminded her that she is due for follow-up with Dr. Clifton JamesMcAlhany in June. I advised that she could review her questions with him at that time since she does not have another trip planned prior to that time. She verbalized understanding and agreement and thanked me for the call.

## 2016-04-08 NOTE — Telephone Encounter (Signed)
Pt states she is leaving for [place] tomorrow and was inquiring about going scuba diving on trip and if this was ok with her heart condition/issues.  Please call and advise.  Thanks

## 2016-04-08 NOTE — Telephone Encounter (Signed)
I would advise against SCUBA diving at this time. Thanks, chris

## 2016-04-18 ENCOUNTER — Encounter: Payer: Self-pay | Admitting: Physician Assistant

## 2016-04-21 ENCOUNTER — Telehealth (HOSPITAL_COMMUNITY): Payer: Self-pay | Admitting: Internal Medicine

## 2016-04-21 ENCOUNTER — Encounter (HOSPITAL_COMMUNITY): Payer: Self-pay | Admitting: Internal Medicine

## 2016-04-21 NOTE — Telephone Encounter (Signed)
Mailed letter with [facility] along with my chart message... KJ  °

## 2016-04-28 ENCOUNTER — Ambulatory Visit (INDEPENDENT_AMBULATORY_CARE_PROVIDER_SITE_OTHER): Payer: BLUE CROSS/BLUE SHIELD | Admitting: *Deleted

## 2016-04-28 ENCOUNTER — Ambulatory Visit (INDEPENDENT_AMBULATORY_CARE_PROVIDER_SITE_OTHER): Payer: BLUE CROSS/BLUE SHIELD | Admitting: Physician Assistant

## 2016-04-28 ENCOUNTER — Encounter: Payer: Self-pay | Admitting: Physician Assistant

## 2016-04-28 ENCOUNTER — Telehealth: Payer: Self-pay | Admitting: Physician Assistant

## 2016-04-28 VITALS — BP 120/54 | HR 60 | Ht 63.0 in | Wt 155.1 lb

## 2016-04-28 DIAGNOSIS — Z95828 Presence of other vascular implants and grafts: Secondary | ICD-10-CM

## 2016-04-28 DIAGNOSIS — G43909 Migraine, unspecified, not intractable, without status migrainosus: Secondary | ICD-10-CM | POA: Insufficient documentation

## 2016-04-28 DIAGNOSIS — G43109 Migraine with aura, not intractable, without status migrainosus: Secondary | ICD-10-CM

## 2016-04-28 DIAGNOSIS — R42 Dizziness and giddiness: Secondary | ICD-10-CM | POA: Diagnosis not present

## 2016-04-28 DIAGNOSIS — Z952 Presence of prosthetic heart valve: Secondary | ICD-10-CM | POA: Diagnosis not present

## 2016-04-28 DIAGNOSIS — I10 Essential (primary) hypertension: Secondary | ICD-10-CM

## 2016-04-28 DIAGNOSIS — Z5181 Encounter for therapeutic drug level monitoring: Secondary | ICD-10-CM

## 2016-04-28 LAB — POCT INR: INR: 2.1

## 2016-04-28 NOTE — Patient Instructions (Signed)
Your physician recommends that you continue on your current medications as directed. Please refer to the Current Medication list given to you today.   Your physician recommends that you schedule a follow-up appointment in:  2-3  MONTHS WITH DR  Clifton JamesMCALHANY

## 2016-04-28 NOTE — Telephone Encounter (Signed)
New message      Pt was seen this am.  She forgot to ask if she needed to take an antibiotic prior to going to the dentist?  Please call

## 2016-04-28 NOTE — Progress Notes (Signed)
Cardiology Office Note    Date:  04/28/2016   ID:  Marissa Bailey, DOB 15-Sep-1960, MRN [id number]  PCP:  Lorretta Harp, MD  Cardiologist: Dr. Clifton James  Chief Complaint  Patient presents with  . Follow-up    History of Present Illness:  Marissa Bailey is a 56 y.o. female with history of SVT, PACs and severe aortic valve insufficiency now s/p Bentall procedure with mechanical aortic valve replacement .She had a normal stress test in 2013. 48 hour monitor in 2013 with NSR, PACs and several short runs of SVT (15 beats). She was seen in our office August 2017 for routine follow up. Echo 10/29/15 with normal LV function, severe aortic insufficiency. TEE on 96/17 with normal LV function, severe AI, mild MR, PFO. Cardiac cath 11/15/15 with normal coronary arteries. Dr. Laneta Simmers performed a Bentall procedure 12/03/15 with placement of a mechanical AVR with replacement of the ascending aorta and proximal aortic arch.   I saw patient 03/31/16 and she was having dizziness and some aura's similar to her migraines in the past. BP was up and she wasn't orthostatic. I increased her lisinopril to 10 mg daily.  CBC and CMET normal.  Patient comes in today feeling better. She has had no further dizziness. She still haven't some are is without the headaches. She notices she has a sense of getting out of breath if she is rushing or in a hurry. No chest pain, palpitations or dyspnea on typical exertion.         Past Medical History:  Diagnosis Date  . Abnormal TSH   . Anemia   . Anxiety   . Aortic valve insufficiency    a. s/p Bentall with mechanical aortic valve replacement, with replacement of the ascending aorta and proximal aortic arch 12/2015.  . Migraines    menstrual  . Patent foramen ovale   . Premature atrial contractions   . PSVT (paroxysmal supraventricular tachycardia) (HCC)    a. 48 hour monitor in 2013 with NSR, PACs and several short runs of SVT (15 beats).  . Seasonal allergies      Past Surgical History:  Procedure Laterality Date  . BENTALL PROCEDURE N/A 12/03/2015   Procedure: BENTALL PROCEDURE USING ST. JUDE AORTIC VALVE CONDUIT;  Surgeon: Alleen Borne, MD;  Location: [facility];  Service: Open Heart Surgery;  Laterality: N/A;  CIRC ARREST  . CARDIAC CATHETERIZATION N/A 11/15/2015   Procedure: Right/Left Heart Cath and Coronary Angiography;  Surgeon: Kathleene Hazel, MD;  Location: [facility] INVASIVE CV LAB;  Service: Cardiovascular;  Laterality: N/A;  . DILATION AND CURETTAGE OF UTERUS    . EXPLORATORY LAPAROTOMY     for tubal pregnancy  . EYE SURGERY Bilateral    lasik  . REPLACEMENT ASCENDING AORTA N/A 12/03/2015   Procedure: REPLACEMENT ASCENDING AORTA USING HEMASHIELD GRAFT;  Surgeon: Alleen Borne, MD;  Location: [facility] OR;  Service: Open Heart Surgery;  Laterality: N/A;  . TEE WITHOUT CARDIOVERSION N/A 11/07/2015   Procedure: TRANSESOPHAGEAL ECHOCARDIOGRAM (TEE);  Surgeon: Chrystie Nose, MD;  Location: [facility] ENDOSCOPY;  Service: Cardiovascular;  Laterality: N/A;  . TEE WITHOUT CARDIOVERSION N/A 12/03/2015   Procedure: TRANSESOPHAGEAL ECHOCARDIOGRAM (TEE);  Surgeon: Alleen Borne, MD;  Location: [facility] OR;  Service: Open Heart Surgery;  Laterality: N/A;  . vein surgery lower extremity      Current Medications: Outpatient Medications Prior to Visit  Medication Sig Dispense Refill  . acetaminophen (TYLENOL) 325 MG tablet Take 2 tablets (650 mg total)  by mouth every 6 (six) hours as needed for mild pain.    Marland Kitchen aspirin 81 MG tablet Take 81 mg by mouth at bedtime.     . Cholecalciferol (VITAMIN D) 1000 UNITS capsule Take 2,000 Units by mouth daily.     . ciprofloxacin (CIPRO) 500 MG tablet Take 1 tablet (500 mg total) by mouth 2 (two) times daily. If needed for travelers diarrhea 6 tablet 0  . fish oil-omega-3 fatty acids 1000 MG capsule Take 2 g by mouth daily.      Marland Kitchen glucosamine-chondroitin 500-400 MG tablet Take 1 tablet by mouth daily.    Marland Kitchen lisinopril  (PRINIVIL,ZESTRIL) 10 MG tablet Take 1 tablet (10 mg total) by mouth daily. 30 tablet 3  . MAGNESIUM CARBONATE PO Take 1 tablet by mouth daily.     . metoprolol tartrate (LOPRESSOR) 25 MG tablet Take 1 tablet (25 mg total) by mouth 2 (two) times daily. 60 tablet 10  . Multiple Vitamin (MULTIVITAMIN WITH MINERALS) TABS Take 1 tablet by mouth daily.    . NON FORMULARY Apply 0.5 mLs topically daily. 70/30 mix of estriol and estradiol in a cream base. Compounded by [facility].    Marland Kitchen PRESCRIPTION MEDICATION Apply 0.1 mLs topically daily. Testosterone 2% cream compounded    . progesterone (PROMETRIUM) 100 MG capsule Take 100 mg by mouth at bedtime.    . traMADol (ULTRAM) 50 MG tablet Take 1 tablet (50 mg total) by mouth every 6 (six) hours as needed for moderate pain. 30 tablet 0  . vitamin B-12 (CYANOCOBALAMIN) 1000 MCG tablet Take 1,000 mcg by mouth daily.    . vitamin C (ASCORBIC ACID) 500 MG tablet Take 1,000 mg by mouth daily.     Marland Kitchen warfarin (COUMADIN) 5 MG tablet Take as directed by Coumadin clinic. 30 tablet 2   No facility-administered medications prior to visit.      Allergies:   No known allergies   Social History   Social History  . Marital status: Married    Spouse name: N/A  . Number of children: 0  . Years of education: N/A   Social History Main Topics  . Smoking status: Never Smoker  . Smokeless tobacco: Never Used  . Alcohol use Yes     Comment: socially  . Drug use: No  . Sexual activity: Not Asked   Other Topics Concern  . 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   ROS:   Please see the history of present illness.    Review of Systems  Constitution: Negative.  HENT: Negative.   Eyes: Negative.   Cardiovascular: Negative.   Respiratory: Negative.   Hematologic/Lymphatic: Negative.   Musculoskeletal: Positive for back pain. Negative for joint pain.  Gastrointestinal: Negative.   Genitourinary: Negative.   Neurological: Negative.        Aura   All other systems reviewed and are negative.   PHYSICAL EXAM:   VS:  BP (!) 120/54   Pulse 60   Ht 5\' 3"  (  1.6 m)   Wt 155 lb 1.9 oz (70.4 kg)   BMI 27.48 kg/m   Physical Exam  GEN: Well nourished, well developed, in no acute distress  Neck: no JVD, carotid bruits, or masses Cardiac:RRR;Crisp valvular clicks, 2 to 3/6 diastolic murmur at the left sternal border  Respiratory:  clear to auscultation bilaterally, normal work of breathing GI: soft, nontender, nondistended, + BS Ext: without cyanosis, clubbing, or edema, Good distal pulses bilaterally Psych: euthymic mood, full affect  Wt Readings from Last 3 Encounters:  04/28/16 155 lb 1.9 oz (70.4 kg)  03/31/16 154 lb (69.9 kg)  02/11/16 153 lb (69.4 kg)      Studies/Labs Reviewed:   EKG:  EKG is not ordered today.  Recent Labs: 11/30/2015: ALT 23 12/04/2015: Magnesium 2.2 12/05/2015: Hemoglobin 12.8 03/31/2016: BUN 17; Creatinine, Ser 0.97; Platelets 263; Potassium 4.3; Sodium 140   Lipid Panel    Component Value Date/Time   CHOL 177 09/18/2009 1621   TRIG 109.0 09/18/2009 1621   HDL 76.50 09/18/2009 1621   CHOLHDL 2 09/18/2009 1621   VLDL 21.8 09/18/2009 1621   LDLCALC 79 09/18/2009 1621    Additional studies/ records that were reviewed today include:   Echo 10/29/15: Left ventricle: The cavity size was normal. Systolic function was   normal. The estimated ejection fraction was in the range of 50%   to 55%. Wall motion was normal; there were no regional wall   motion abnormalities. Left ventricular  diastolic function   parameters were normal. Internal dimension, ES (PLAX chordal):   39.2 mm. - Aortic valve: A bicuspid morphology cannot be excluded; normal   thickness leaflets. There was severe regurgitation. - Aorta: Aortic root dimension: 38 mm (ED). - Ascending aorta: The ascending aorta was mildly dilated.    TEE 11/07/15: Left ventricle: The cavity size was normal. Wall thickness was   normal. Systolic function was normal. The estimated ejection   fraction was in the range of 55% to 60%. Wall motion was normal;   there were no regional wall motion abnormalities. - Aortic valve: Dilated sinotubular junction to 4.2 cm. Trileaflet   valve. Severe regurgitation. Vena contracta measures 1.1 cm.   Regurgitation pressure half-time: 253 ms. - Mitral valve: There was mild regurgitation. - Left atrium: No evidence of thrombus in the atrial cavity or   appendage. - Right atrium: No evidence of thrombus in the atrial cavity or   appendage. - Atrial septum: There was a patent foramen ovale. Left to right,   but not right to left shunting. There is a type 1R atrial septal   aneurysm. - Pulmonic valve: No evidence of vegetation.   Impressions:   - Likely severe aortic stenosis. The preferred modality to evaluate   regurgitant volume is PISA analysis by transthoracic   echocardiogram. That being said, this TEE study does demonstrate   a dilated sinotubular junction with a normal trileaflet   structure. Evaluation for aortic valve surgery may be indicated.         ASSESSMENT:    1. S/P AVR   2. Hx of ascending aorta replacement   3. Dizziness   4. Essential hypertension   5. Migraine with aura and without status migrainosus, not intractable      PLAN:  In order of problems listed above: Status post aVR and ascending aorta replacement 12/2015 doing well. Patient does have a diastolic murmur consistent with AI.F/U with Dr. Clifton JamesMcAlhany in 2-3 months.  Dizziness has resolved with  treatment  of her hypertension. No further dizziness since lisinopril increased.  History of migraines. No longer having headaches but still having aura's. Recommend f/u with headache clinic     Medication Adjustments/Labs and Tests Ordered: Current medicines are reviewed at length with the patient today.  Concerns regarding medicines are outlined above.  Medication changes, Labs and Tests ordered today are listed in the Patient Instructions below. Patient Instructions  Your physician recommends that you continue on your current medications as directed. Please refer to the Current Medication list given to you today.   Your physician recommends that you schedule a follow-up appointment in:  2-3  MONTHS WITH DR  Kathryne Gin, Jacolyn Reedy, PA-C  04/28/2016 9:08 AM    [facility] [address] [place], [place], [place]  [address] Phone: [phone]; Fax: [phone]

## 2016-04-28 NOTE — Telephone Encounter (Signed)
Yes patient will need antibiotics prior to dental work

## 2016-04-28 NOTE — Telephone Encounter (Addendum)
LEFT VOICE MAIL  THAT PT  REQUIRES   ANTIBIOTIC THERAPY PRIOR  TO DENTAL  SERVICES .Marissa Bailey/CY

## 2016-05-14 ENCOUNTER — Other Ambulatory Visit: Payer: Self-pay | Admitting: *Deleted

## 2016-05-14 MED ORDER — WARFARIN SODIUM 5 MG PO TABS: ORAL_TABLET | ORAL | 3 refills | Status: DC

## 2016-05-14 MED ORDER — LISINOPRIL 10 MG PO TABS: 10.0000 mg | ORAL_TABLET | Freq: Every day | ORAL | 3 refills | Status: DC

## 2016-05-26 ENCOUNTER — Ambulatory Visit (INDEPENDENT_AMBULATORY_CARE_PROVIDER_SITE_OTHER): Payer: BLUE CROSS/BLUE SHIELD | Admitting: *Deleted

## 2016-05-26 DIAGNOSIS — Z952 Presence of prosthetic heart valve: Secondary | ICD-10-CM

## 2016-05-26 DIAGNOSIS — Z5181 Encounter for therapeutic drug level monitoring: Secondary | ICD-10-CM

## 2016-05-26 LAB — POCT INR: INR: 1.8

## 2016-06-06 ENCOUNTER — Other Ambulatory Visit: Payer: Self-pay | Admitting: *Deleted

## 2016-06-06 MED ORDER — WARFARIN SODIUM 5 MG PO TABS: ORAL_TABLET | ORAL | 0 refills | Status: DC

## 2016-06-06 NOTE — Telephone Encounter (Signed)
Received fax from pharmacy requesting a ninety day supply.

## 2016-06-09 ENCOUNTER — Ambulatory Visit (INDEPENDENT_AMBULATORY_CARE_PROVIDER_SITE_OTHER): Payer: BLUE CROSS/BLUE SHIELD | Admitting: *Deleted

## 2016-06-09 DIAGNOSIS — Z952 Presence of prosthetic heart valve: Secondary | ICD-10-CM

## 2016-06-09 DIAGNOSIS — Z5181 Encounter for therapeutic drug level monitoring: Secondary | ICD-10-CM | POA: Diagnosis not present

## 2016-06-09 LAB — POCT INR: INR: 1.9

## 2016-06-12 ENCOUNTER — Telehealth: Payer: Self-pay | Admitting: *Deleted

## 2016-06-12 MED ORDER — AMOXICILLIN 500 MG PO TABS: ORAL_TABLET | ORAL | 4 refills | Status: DC

## 2016-06-12 NOTE — Telephone Encounter (Signed)
Patient called and stated that she has a dental appointment on 06/17/16 and she is requesting that an rx for an abx be sent to [address]. Please advise. Thanks, MI

## 2016-06-12 NOTE — Telephone Encounter (Signed)
I spoke with pt and confirmed she has no known allergies.  I told her she would need to take Amoxicillin 2 gm 30-60 minutes prior to dental appointment.  [facility] [address] [facility]

## 2016-06-23 ENCOUNTER — Ambulatory Visit (INDEPENDENT_AMBULATORY_CARE_PROVIDER_SITE_OTHER): Payer: BLUE CROSS/BLUE SHIELD

## 2016-06-23 DIAGNOSIS — Z5181 Encounter for therapeutic drug level monitoring: Secondary | ICD-10-CM

## 2016-06-23 DIAGNOSIS — Z952 Presence of prosthetic heart valve: Secondary | ICD-10-CM | POA: Diagnosis not present

## 2016-06-23 LAB — POCT INR: INR: 2.5

## 2016-07-20 NOTE — Progress Notes (Signed)
Chief Complaint  Patient presents with  . Follow-up    aortic valve disease    History of Present Illness: 56 yo female with history of SVT, PACs and severe aortic valve insufficiency now s/p Bentall procedure with mechanical aortic valve replacement here today for cardiac follow up. I have followed her for palpitations and valve disease. She is known to have PACs and SVT documented by cardiac monitor in 2013. She was found to have severe aortic valve insufficiency in 2017. TEE on 96/17 with normal LV function, severe AI, mild MR, PFO. Cardiac cath 11/15/15 with normal coronary arteries. Dr. Laneta Simmers performed a Bentall procedure 12/03/15 with placement of a mechanical AVR with replacement of the ascending aorta and proximal aortic arch.   She is here today for follow up. The patient denies any chest pain, dyspnea, palpitations, lower extremity edema, orthopnea, PND, dizziness, near syncope or syncope.   Primary Care Physician: Madelin Headings, MD   Past Medical History:  Diagnosis Date  . Abnormal TSH   . Anemia   . Anxiety   . Aortic valve insufficiency    a. s/p Bentall with mechanical aortic valve replacement, with replacement of the ascending aorta and proximal aortic arch 12/2015.  . Migraines    menstrual  . Patent foramen ovale   . Premature atrial contractions   . PSVT (paroxysmal supraventricular tachycardia) (HCC)    a. 48 hour monitor in 2013 with NSR, PACs and several short runs of SVT (15 beats).  . Seasonal allergies     Past Surgical History:  Procedure Laterality Date  . BENTALL PROCEDURE N/A 12/03/2015   Procedure: BENTALL PROCEDURE USING ST. JUDE AORTIC VALVE CONDUIT;  Surgeon: Alleen Borne, MD;  Location: [facility] OR;  Service: Open Heart Surgery;  Laterality: N/A;  CIRC ARREST  . CARDIAC CATHETERIZATION N/A 11/15/2015   Procedure: Right/Left Heart Cath and Coronary Angiography;  Surgeon: Kathleene Hazel, MD;  Location: [facility] INVASIVE CV LAB;  Service:  Cardiovascular;  Laterality: N/A;  . DILATION AND CURETTAGE OF UTERUS    . EXPLORATORY LAPAROTOMY     for tubal pregnancy  . EYE SURGERY Bilateral    lasik  . REPLACEMENT ASCENDING AORTA N/A 12/03/2015   Procedure: REPLACEMENT ASCENDING AORTA USING HEMASHIELD GRAFT;  Surgeon: Alleen Borne, MD;  Location: [facility] OR;  Service: Open Heart Surgery;  Laterality: N/A;  . TEE WITHOUT CARDIOVERSION N/A 11/07/2015   Procedure: TRANSESOPHAGEAL ECHOCARDIOGRAM (TEE);  Surgeon: Chrystie Nose, MD;  Location: [facility] ENDOSCOPY;  Service: Cardiovascular;  Laterality: N/A;  . TEE WITHOUT CARDIOVERSION N/A 12/03/2015   Procedure: TRANSESOPHAGEAL ECHOCARDIOGRAM (TEE);  Surgeon: Alleen Borne, MD;  Location: [facility] OR;  Service: Open Heart Surgery;  Laterality: N/A;  . vein surgery lower extremity      Current Outpatient Prescriptions  Medication Sig Dispense Refill  . acetaminophen (TYLENOL) 325 MG tablet Take 2 tablets (650 mg total) by mouth every 6 (six) hours as needed for mild pain.    Marland Kitchen amoxicillin (AMOXIL) 500 MG tablet Take 4 tablets 30-60 minutes prior to dental appointment. 4 tablet 4  . aspirin 81 MG tablet Take 81 mg by mouth at bedtime.     . Cholecalciferol (VITAMIN D) 1000 UNITS capsule Take 2,000 Units by mouth daily.     . fish oil-omega-3 fatty acids 1000 MG capsule Take 2 g by mouth daily.      Marland Kitchen glucosamine-chondroitin 500-400 MG tablet Take 1 tablet by mouth daily.    Marland Kitchen  MAGNESIUM CARBONATE PO Take 1 tablet by mouth daily.     . metoprolol tartrate (LOPRESSOR) 25 MG tablet Take 1 tablet (25 mg total) by mouth 2 (two) times daily. 60 tablet 10  . Multiple Vitamin (MULTIVITAMIN WITH MINERALS) TABS Take 1 tablet by mouth daily.    . NON FORMULARY Apply 0.5 mLs topically daily. 70/30 mix of estriol and estradiol in a cream base. Compounded by [facility].    Marland Kitchen. PRESCRIPTION MEDICATION Apply 0.1 mLs topically daily. Testosterone 2% cream compounded    . progesterone (PROMETRIUM) 100 MG capsule  Take 100 mg by mouth at bedtime.    . vitamin B-12 (CYANOCOBALAMIN) 1000 MCG tablet Take 1,000 mcg by mouth daily.    . vitamin C (ASCORBIC ACID) 500 MG tablet Take 1,000 mg by mouth daily.     Marland Kitchen. warfarin (COUMADIN) 5 MG tablet Take as directed by Coumadin clinic. 90 tablet 0  . lisinopril (PRINIVIL,ZESTRIL) 5 MG tablet Take 1 tablet (5 mg total) by mouth daily. 90 tablet 3   No current facility-administered medications for this visit.     No Known Allergies  Social History   Social History  . Marital status: Married    Spouse name: N/A  . Number of children: 0  . Years of education: N/A   Occupational History  . Not on file.   Social History Main Topics  . Smoking status: Never Smoker  . Smokeless tobacco: Never Used  . Alcohol use Yes     Comment: socially  . Drug use: No  . Sexual activity: Not on file   Other Topics Concern  . 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 Heart attack Mother [id number]       brain impairment tobacco dm  . Diabetes Mother   . Hyperlipidemia Mother   . Heart disease Sister        whole in heart age 134  . Cancer Paternal Grandmother        breast  . Stroke Paternal Grandmother   . Heart failure Brother     Review of Systems:  As stated in the HPI and otherwise negative.   BP (!) 122/50   Pulse 67   Ht 5\' 3"  (1.6 m)   Wt 156 lb 12.8 oz (71.1 kg)   SpO2 98%   BMI 27.78 kg/m   Physical Examination:  General: Well developed, well nourished, NAD  HEENT: OP clear, mucus membranes moist  SKIN: warm, dry. No rashes. Neuro: No focal deficits  Musculoskeletal: Muscle strength 5/5 all ext  Psychiatric: Mood and affect normal  Neck: No JVD, no carotid bruits, no thyromegaly, no lymphadenopathy.  Lungs:Clear bilaterally, no wheezes, rhonci,  crackles Cardiovascular: Regular rate and rhythm. No murmurs, gallops or rubs. Abdomen:Soft. Bowel sounds present. Non-tender.  Extremities: No lower extremity edema. Pulses are 2 + in the bilateral DP/PT.  Echo 10/29/15: Left ventricle: The cavity size was normal. Systolic function was   normal. The estimated ejection fraction was in the range of 50%   to 55%. Wall motion was normal; there were no regional wall   motion abnormalities. Left ventricular diastolic function   parameters were normal. Internal dimension, ES (PLAX chordal):  39.2 mm. - Aortic valve: A bicuspid morphology cannot be excluded; normal   thickness leaflets. There was severe regurgitation. - Aorta: Aortic root dimension: 38 mm (ED). - Ascending aorta: The ascending aorta was mildly dilated.   EKG:  EKG is not ordered today. The ekg ordered today demonstrates   Recent Labs: 11/30/2015: ALT 23 12/04/2015: Magnesium 2.2 12/05/2015: Hemoglobin 12.8 03/31/2016: BUN 17; Creatinine, Ser 0.97; Platelets 263; Potassium 4.3; Sodium 140    Wt Readings from Last 3 Encounters:  07/21/16 156 lb 12.8 oz (71.1 kg)  04/28/16 155 lb 1.9 oz (70.4 kg)  03/31/16 154 lb (69.9 kg)     Other studies Reviewed: Additional studies/ records that were reviewed today include: . Review of the above records demonstrates:   Assessment and Plan:   1. Severe aortic valve insufficiency: She is s/p Bentall procedure with mechanical AVR in October 2017. She is doing well. Continue coumadin. Will arrange echo to assess valve with prominent murmur.   2. Thoracic aortic aneurysm: She is s/p replacement of the aortic root and ascending aorta in October 2017.   3. Weakness: Occasional weakness late in the day. BP has been on the lower end at times. Will cut Lisinopril back to 5 mg per day.     Current medicines are reviewed at length with the patient today.  The patient does not have concerns regarding medicines.  The following changes have  been made:  no change  Labs/ tests ordered today include:   Orders Placed This Encounter  Procedures  . ECHOCARDIOGRAM COMPLETE    Disposition:   FU with me in 12 months.     Signed, Verne Carrow, MD 07/21/2016 8:48 AM    [facility] [address] [place], [place], [place]  [address] Phone: [phone]; Fax: [phone]

## 2016-07-21 ENCOUNTER — Ambulatory Visit (INDEPENDENT_AMBULATORY_CARE_PROVIDER_SITE_OTHER): Payer: BLUE CROSS/BLUE SHIELD | Admitting: *Deleted

## 2016-07-21 ENCOUNTER — Encounter: Payer: Self-pay | Admitting: Cardiovascular Disease

## 2016-07-21 ENCOUNTER — Ambulatory Visit (INDEPENDENT_AMBULATORY_CARE_PROVIDER_SITE_OTHER): Payer: BLUE CROSS/BLUE SHIELD | Admitting: Cardiovascular Disease

## 2016-07-21 VITALS — BP 122/50 | HR 67 | Ht 63.0 in | Wt 156.8 lb

## 2016-07-21 DIAGNOSIS — I712 Thoracic aortic aneurysm, without rupture, unspecified: Secondary | ICD-10-CM

## 2016-07-21 DIAGNOSIS — I351 Nonrheumatic aortic (valve) insufficiency: Secondary | ICD-10-CM | POA: Diagnosis not present

## 2016-07-21 DIAGNOSIS — Z952 Presence of prosthetic heart valve: Secondary | ICD-10-CM

## 2016-07-21 DIAGNOSIS — Z5181 Encounter for therapeutic drug level monitoring: Secondary | ICD-10-CM | POA: Diagnosis not present

## 2016-07-21 LAB — POCT INR: INR: 2.6

## 2016-07-21 MED ORDER — LISINOPRIL 5 MG PO TABS: 5.0000 mg | ORAL_TABLET | Freq: Every day | ORAL | 3 refills | Status: DC

## 2016-07-21 NOTE — Patient Instructions (Signed)
Medication Instructions:  Your physician has recommended you make the following change in your medication:  Decrease lisinopril to 5 mg by mouth daily.   Labwork: none  Testing/Procedures: Your physician has requested that you have an echocardiogram. Echocardiography is a painless test that uses sound waves to create images of your heart. It provides your doctor with information about the size and shape of your heart and how well your heart's chambers and valves are working. This procedure takes approximately one hour. There are no restrictions for this procedure.    Follow-Up: Your physician recommends that you schedule a follow-up appointment in: 12 months. Please call our office in about 9 months to schedule this appointment.     Any Other Special Instructions Will Be Listed Below (If Applicable).     If you need a refill on your cardiac medications before your next appointment, please call your pharmacy.

## 2016-08-04 ENCOUNTER — Other Ambulatory Visit (HOSPITAL_COMMUNITY): Payer: BLUE CROSS/BLUE SHIELD

## 2016-08-14 ENCOUNTER — Ambulatory Visit (HOSPITAL_COMMUNITY): Payer: BLUE CROSS/BLUE SHIELD | Attending: Internal Medicine

## 2016-08-14 ENCOUNTER — Ambulatory Visit (INDEPENDENT_AMBULATORY_CARE_PROVIDER_SITE_OTHER): Payer: BLUE CROSS/BLUE SHIELD | Admitting: *Deleted

## 2016-08-14 ENCOUNTER — Other Ambulatory Visit: Payer: Self-pay

## 2016-08-14 DIAGNOSIS — Z952 Presence of prosthetic heart valve: Secondary | ICD-10-CM | POA: Insufficient documentation

## 2016-08-14 DIAGNOSIS — I351 Nonrheumatic aortic (valve) insufficiency: Secondary | ICD-10-CM

## 2016-08-14 DIAGNOSIS — Z5181 Encounter for therapeutic drug level monitoring: Secondary | ICD-10-CM

## 2016-08-14 DIAGNOSIS — I340 Nonrheumatic mitral (valve) insufficiency: Secondary | ICD-10-CM | POA: Diagnosis not present

## 2016-08-14 DIAGNOSIS — Z8249 Family history of ischemic heart disease and other diseases of the circulatory system: Secondary | ICD-10-CM | POA: Diagnosis not present

## 2016-08-14 DIAGNOSIS — I517 Cardiomegaly: Secondary | ICD-10-CM | POA: Diagnosis not present

## 2016-08-14 DIAGNOSIS — I712 Thoracic aortic aneurysm, without rupture, unspecified: Secondary | ICD-10-CM

## 2016-08-14 LAB — POCT INR: INR: 2.7

## 2016-08-19 ENCOUNTER — Telehealth: Payer: Self-pay | Admitting: Cardiovascular Disease

## 2016-08-19 NOTE — Telephone Encounter (Signed)
I spoke with pt and reviewed echo results with her.  

## 2016-08-19 NOTE — Telephone Encounter (Signed)
Follow Up:      Returning your call from today. 

## 2016-09-02 ENCOUNTER — Other Ambulatory Visit: Payer: Self-pay | Admitting: Cardiology

## 2016-09-02 ENCOUNTER — Other Ambulatory Visit: Payer: Self-pay | Admitting: Cardiovascular Disease

## 2016-09-25 ENCOUNTER — Ambulatory Visit (INDEPENDENT_AMBULATORY_CARE_PROVIDER_SITE_OTHER): Payer: BLUE CROSS/BLUE SHIELD | Admitting: *Deleted

## 2016-09-25 DIAGNOSIS — Z5181 Encounter for therapeutic drug level monitoring: Secondary | ICD-10-CM

## 2016-09-25 DIAGNOSIS — Z952 Presence of prosthetic heart valve: Secondary | ICD-10-CM

## 2016-09-25 LAB — POCT INR: INR: 2.5

## 2016-11-07 ENCOUNTER — Ambulatory Visit (INDEPENDENT_AMBULATORY_CARE_PROVIDER_SITE_OTHER): Payer: BLUE CROSS/BLUE SHIELD | Admitting: *Deleted

## 2016-11-07 DIAGNOSIS — I351 Nonrheumatic aortic (valve) insufficiency: Secondary | ICD-10-CM | POA: Diagnosis not present

## 2016-11-07 DIAGNOSIS — Z952 Presence of prosthetic heart valve: Secondary | ICD-10-CM

## 2016-11-07 DIAGNOSIS — Z5181 Encounter for therapeutic drug level monitoring: Secondary | ICD-10-CM | POA: Diagnosis not present

## 2016-11-07 LAB — POCT INR: INR: 2.5

## 2016-11-12 DIAGNOSIS — Z1231 Encounter for screening mammogram for malignant neoplasm of breast: Secondary | ICD-10-CM | POA: Diagnosis not present

## 2016-11-12 DIAGNOSIS — Z6828 Body mass index (BMI) 28.0-28.9, adult: Secondary | ICD-10-CM | POA: Diagnosis not present

## 2016-11-12 DIAGNOSIS — Z01419 Encounter for gynecological examination (general) (routine) without abnormal findings: Secondary | ICD-10-CM | POA: Diagnosis not present

## 2016-11-12 DIAGNOSIS — Z1212 Encounter for screening for malignant neoplasm of rectum: Secondary | ICD-10-CM | POA: Diagnosis not present

## 2016-11-19 DIAGNOSIS — Z131 Encounter for screening for diabetes mellitus: Secondary | ICD-10-CM | POA: Diagnosis not present

## 2016-11-19 DIAGNOSIS — Z13228 Encounter for screening for other metabolic disorders: Secondary | ICD-10-CM | POA: Diagnosis not present

## 2016-11-19 DIAGNOSIS — Z130 Encounter for screening for diseases of the blood and blood-forming organs and certain disorders involving the immune mechanism: Secondary | ICD-10-CM | POA: Diagnosis not present

## 2016-11-19 DIAGNOSIS — Z13220 Encounter for screening for lipoid disorders: Secondary | ICD-10-CM | POA: Diagnosis not present

## 2016-11-21 ENCOUNTER — Encounter: Payer: Self-pay | Admitting: Internal Medicine

## 2016-11-28 ENCOUNTER — Telehealth: Payer: Self-pay | Admitting: Cardiovascular Disease

## 2016-11-28 NOTE — Telephone Encounter (Signed)
New message     Patient would like a call back from Marcy , would not give any specific information

## 2016-11-28 NOTE — Telephone Encounter (Signed)
I spoke with pt who reports she had lab work done recently at gynecologist's office and cholesterol numbers were high.  She was asked to follow up with cardiology.  I asked pt to have results sent to our office for Dr. Clifton James to review. Pt would like to try to lose weight and watch diet before starting medication.

## 2016-12-02 ENCOUNTER — Other Ambulatory Visit: Payer: Self-pay | Admitting: Cardiovascular Disease

## 2016-12-04 NOTE — Telephone Encounter (Signed)
I reviewed her lipids. TC of 300 and LDL 199. She will need therapy. I would have her see primary care to discuss. She has no CAD.   Verne Carrow

## 2016-12-04 NOTE — Telephone Encounter (Signed)
Left message to call back  

## 2016-12-15 NOTE — Telephone Encounter (Signed)
F/u Message ° °Pt returning Rn call .please call back to discuss  °

## 2016-12-15 NOTE — Telephone Encounter (Signed)
I spoke with pt. She does not follow with primary care on a regular basis.  States elevation in cholesterol numbers is new this year.  I offered her appointment in our lipid clinic and she would like to proceed with this.  Appt made in lipid clinic for 12/16/16 at 2:30.

## 2016-12-16 ENCOUNTER — Ambulatory Visit (INDEPENDENT_AMBULATORY_CARE_PROVIDER_SITE_OTHER): Payer: BLUE CROSS/BLUE SHIELD | Admitting: Pharmacist

## 2016-12-16 DIAGNOSIS — E782 Mixed hyperlipidemia: Secondary | ICD-10-CM | POA: Diagnosis not present

## 2016-12-16 DIAGNOSIS — E785 Hyperlipidemia, unspecified: Secondary | ICD-10-CM | POA: Insufficient documentation

## 2016-12-16 MED ORDER — ROSUVASTATIN CALCIUM 20 MG PO TABS: 20.0000 mg | ORAL_TABLET | Freq: Every day | ORAL | 11 refills | Status: DC

## 2016-12-16 NOTE — Progress Notes (Signed)
Patient ID: Marissa Bailey                 DOB: January 12, 1961                    MRN: [id number]     HPI: Marissa Bailey is a 56 y.o. female patient referred to lipid clinic by Dr Clifton James. PMH is significant for SVT, PACs, severe aortic valve insufficiency now s/p Bentall procedure with mechanical AVR, and hyperlipidemia. Pt had her cholesterol checked recently at her OB/GYN and her LDL was elevated at 199. She presents today for lipid management.  Pt states that she has never taken any medication for her cholesterol other than OTC fish oil. She does have a family history of heart disease - her mother had an MI at age 35. Pt eats a low fat diet and stays active. She does not think her cholesterol has been this high in the past and wonders if the readings are potentially inaccurate.  Current Medications: fish oil 2g daily Intolerances: none Risk Factors: baseline LDL 200 LDL goal: 100mg /dL due to family history of CAD  Diet: Eats a lot of vegetables. Cut back on red meat. Likes grilled chicken, salads, and nuts.  Exercise: Uses her home gym.   Family History: Mother with MI at age 66, DM, and HLD. Brother with heart failure and sister with heart disease.  Social History: Denies tobacco use and illicit drug use. Social alcohol use.  Labs: 11/19/16: TC 301, HDL 68, TG 175, LDL 199 (no lipid lowering therapy)  Past Medical History:  Diagnosis Date  . Abnormal TSH   . Anemia   . Anxiety   . Aortic valve insufficiency    a. s/p Bentall with mechanical aortic valve replacement, with replacement of the ascending aorta and proximal aortic arch 12/2015.  . Migraines    menstrual  . Patent foramen ovale   . Premature atrial contractions   . PSVT (paroxysmal supraventricular tachycardia) (HCC)    a. 48 hour monitor in 2013 with NSR, PACs and several short runs of SVT (15 beats).  . Seasonal allergies     Current Outpatient Prescriptions on File Prior to Visit  Medication Sig Dispense Refill  .  acetaminophen (TYLENOL) 325 MG tablet Take 2 tablets (650 mg total) by mouth every 6 (six) hours as needed for mild pain.    Marland Kitchen amoxicillin (AMOXIL) 500 MG tablet Take 4 tablets 30-60 minutes prior to dental appointment. 4 tablet 4  . aspirin 81 MG tablet Take 81 mg by mouth at bedtime.     . Cholecalciferol (VITAMIN D) 1000 UNITS capsule Take 2,000 Units by mouth daily.     . fish oil-omega-3 fatty acids 1000 MG capsule Take 2 g by mouth daily.      Marland Kitchen glucosamine-chondroitin 500-400 MG tablet Take 1 tablet by mouth daily.    Marland Kitchen lisinopril (PRINIVIL,ZESTRIL) 5 MG tablet Take 1 tablet (5 mg total) by mouth daily. 90 tablet 3  . MAGNESIUM CARBONATE PO Take 1 tablet by mouth daily.     . metoprolol tartrate (LOPRESSOR) 25 MG tablet Take 1 tablet (25 mg total) by mouth 2 (two) times daily. 60 tablet 10  . metoprolol tartrate (LOPRESSOR) 25 MG tablet TAKE 1 TABLET BY MOUTH TWICE A DAY 180 tablet 2  . Multiple Vitamin (MULTIVITAMIN WITH MINERALS) TABS Take 1 tablet by mouth daily.    . NON FORMULARY Apply 0.5 mLs topically daily. 70/30 mix of estriol and estradiol in  a cream base. Compounded by [facility].    Marland Kitchen PRESCRIPTION MEDICATION Apply 0.1 mLs topically daily. Testosterone 2% cream compounded    . progesterone (PROMETRIUM) 100 MG capsule Take 100 mg by mouth at bedtime.    . vitamin B-12 (CYANOCOBALAMIN) 1000 MCG tablet Take 1,000 mcg by mouth daily.    . vitamin C (ASCORBIC ACID) 500 MG tablet Take 1,000 mg by mouth daily.     Marland Kitchen warfarin (COUMADIN) 5 MG tablet TAKE AS DIRECTED BY COUMADIN CLINIC. 100 tablet 0   No current facility-administered medications on file prior to visit.     No Known Allergies  Assessment/Plan:  1. Hyperlipidemia - Baseline LDL 199 above goal 100mg /dL for primary prevention with strong family history of heart disease (mother with MI at age 72). Pt has never taken any medication for her cholesterol before and is willing to start Crestor  daily. Will recheck  lipids in 3 months to assess efficacy.  Will recheck baseline labs today per patient's request. She is not fasting - will order a direct LDL.  Imaad Reuss E. Amyria Komar, PharmD, CPP, BCACP  [facility] [address], [place], [place] [address] Phone: [phone]; Fax: [phone] 12/16/2016 3:13 PM

## 2016-12-16 NOTE — Patient Instructions (Addendum)
It was nice to meet you  Start taking Crestor (rosuvastatin)  once a day  Recheck lipids in 3 months. Come in any time after 7:30am on January 16th for fasting labs.  Call #[phone] with any concerns

## 2016-12-17 LAB — LIPID PANEL
CHOL/HDL RATIO: 4 ratio (ref 0.0–4.4)
Cholesterol, Total: 281 mg/dL — ABNORMAL HIGH (ref 100–199)
HDL: 71 mg/dL (ref >39)
LDL Calculated: 139 mg/dL — ABNORMAL HIGH (ref 0–99)
Triglycerides: 356 mg/dL — ABNORMAL HIGH (ref 0–149)
VLDL CHOLESTEROL CAL: 71 mg/dL — AB (ref 5–40)

## 2016-12-17 LAB — LDL CHOLESTEROL, DIRECT: LDL DIRECT: 180 mg/dL — AB (ref 0–99)

## 2016-12-19 ENCOUNTER — Ambulatory Visit (INDEPENDENT_AMBULATORY_CARE_PROVIDER_SITE_OTHER): Payer: BLUE CROSS/BLUE SHIELD | Admitting: *Deleted

## 2016-12-19 DIAGNOSIS — Z5181 Encounter for therapeutic drug level monitoring: Secondary | ICD-10-CM

## 2016-12-19 DIAGNOSIS — I351 Nonrheumatic aortic (valve) insufficiency: Secondary | ICD-10-CM

## 2016-12-19 DIAGNOSIS — Z952 Presence of prosthetic heart valve: Secondary | ICD-10-CM | POA: Diagnosis not present

## 2016-12-19 LAB — POCT INR: INR: 1.5

## 2016-12-30 ENCOUNTER — Ambulatory Visit (INDEPENDENT_AMBULATORY_CARE_PROVIDER_SITE_OTHER): Payer: BLUE CROSS/BLUE SHIELD | Admitting: Pharmacist

## 2016-12-30 DIAGNOSIS — I351 Nonrheumatic aortic (valve) insufficiency: Secondary | ICD-10-CM | POA: Diagnosis not present

## 2016-12-30 DIAGNOSIS — Z952 Presence of prosthetic heart valve: Secondary | ICD-10-CM | POA: Diagnosis not present

## 2016-12-30 DIAGNOSIS — Z5181 Encounter for therapeutic drug level monitoring: Secondary | ICD-10-CM | POA: Diagnosis not present

## 2016-12-30 LAB — POCT INR: INR: 3.1

## 2017-01-06 IMAGING — CR DG CHEST 2V
2 series · 2 of 2 positions shown · non-contrast
Comparison: Chest CT [DATE] and chest radiograph 11/04/2011

CLINICAL DATA: Preop aortic surgery (Bentall procedure)

EXAM:
CHEST  2 VIEW

[w chest pa]
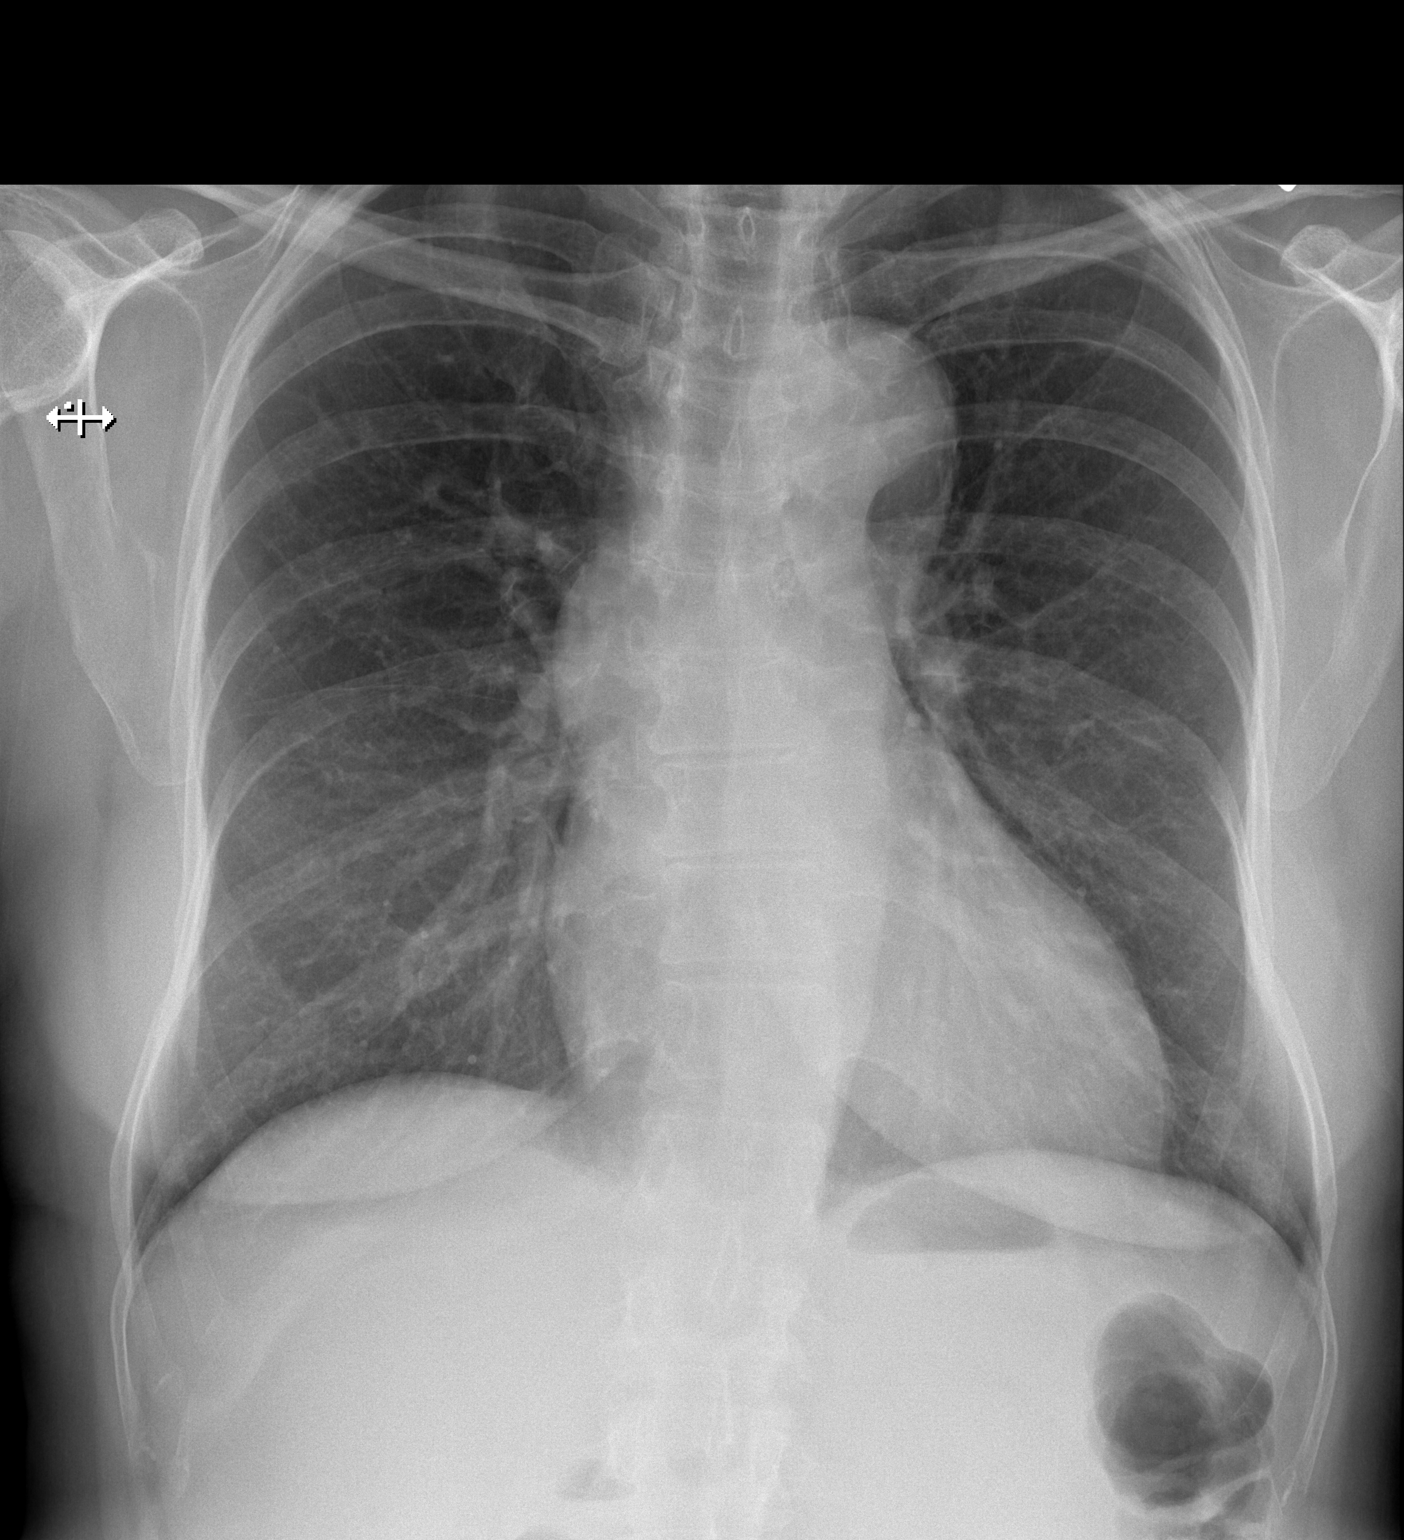

[w chest lat]
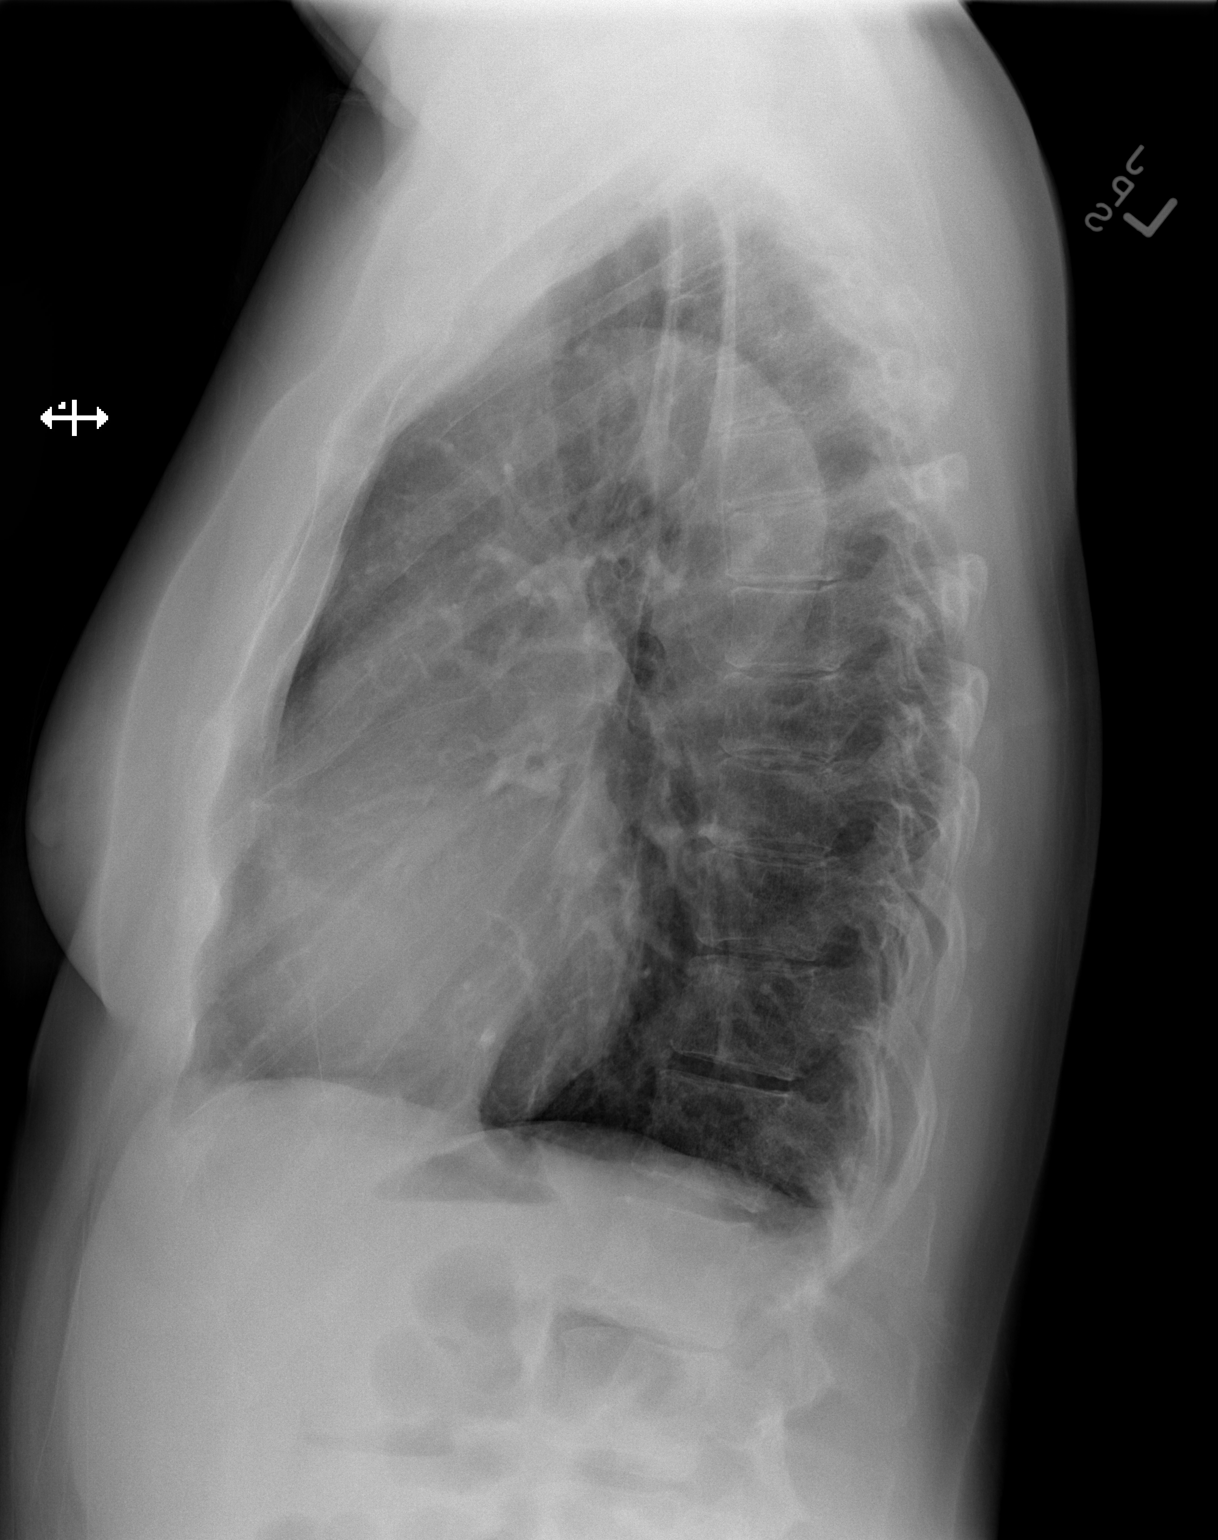

[2 of 2 positions shown; findings below may reference images not displayed]

FINDINGS: The cardiac silhouette is top-normal in size with tortuous ectatic
appearing partially calcified thoracic aorta. The ascending aorta
appears aneurysmal on the lateral projection. Lungs are clear. No
effusion or pulmonary consolidation.
IMPRESSION: Tortuous thoracic aorta with aneurysmal appearance of the ascending
portion, better characterized on the recent CT of the chest. Clear
lungs.

## 2017-01-11 IMAGING — CR DG CHEST 1V PORT
1 series · 1 of 1 positions shown · non-contrast
Comparison: 12/04/2015 and earlier.

CLINICAL DATA: 54-year-old female status post aortic valve
replacement. Initial encounter.

EXAM:
PORTABLE CHEST 1 VIEW

[AP]
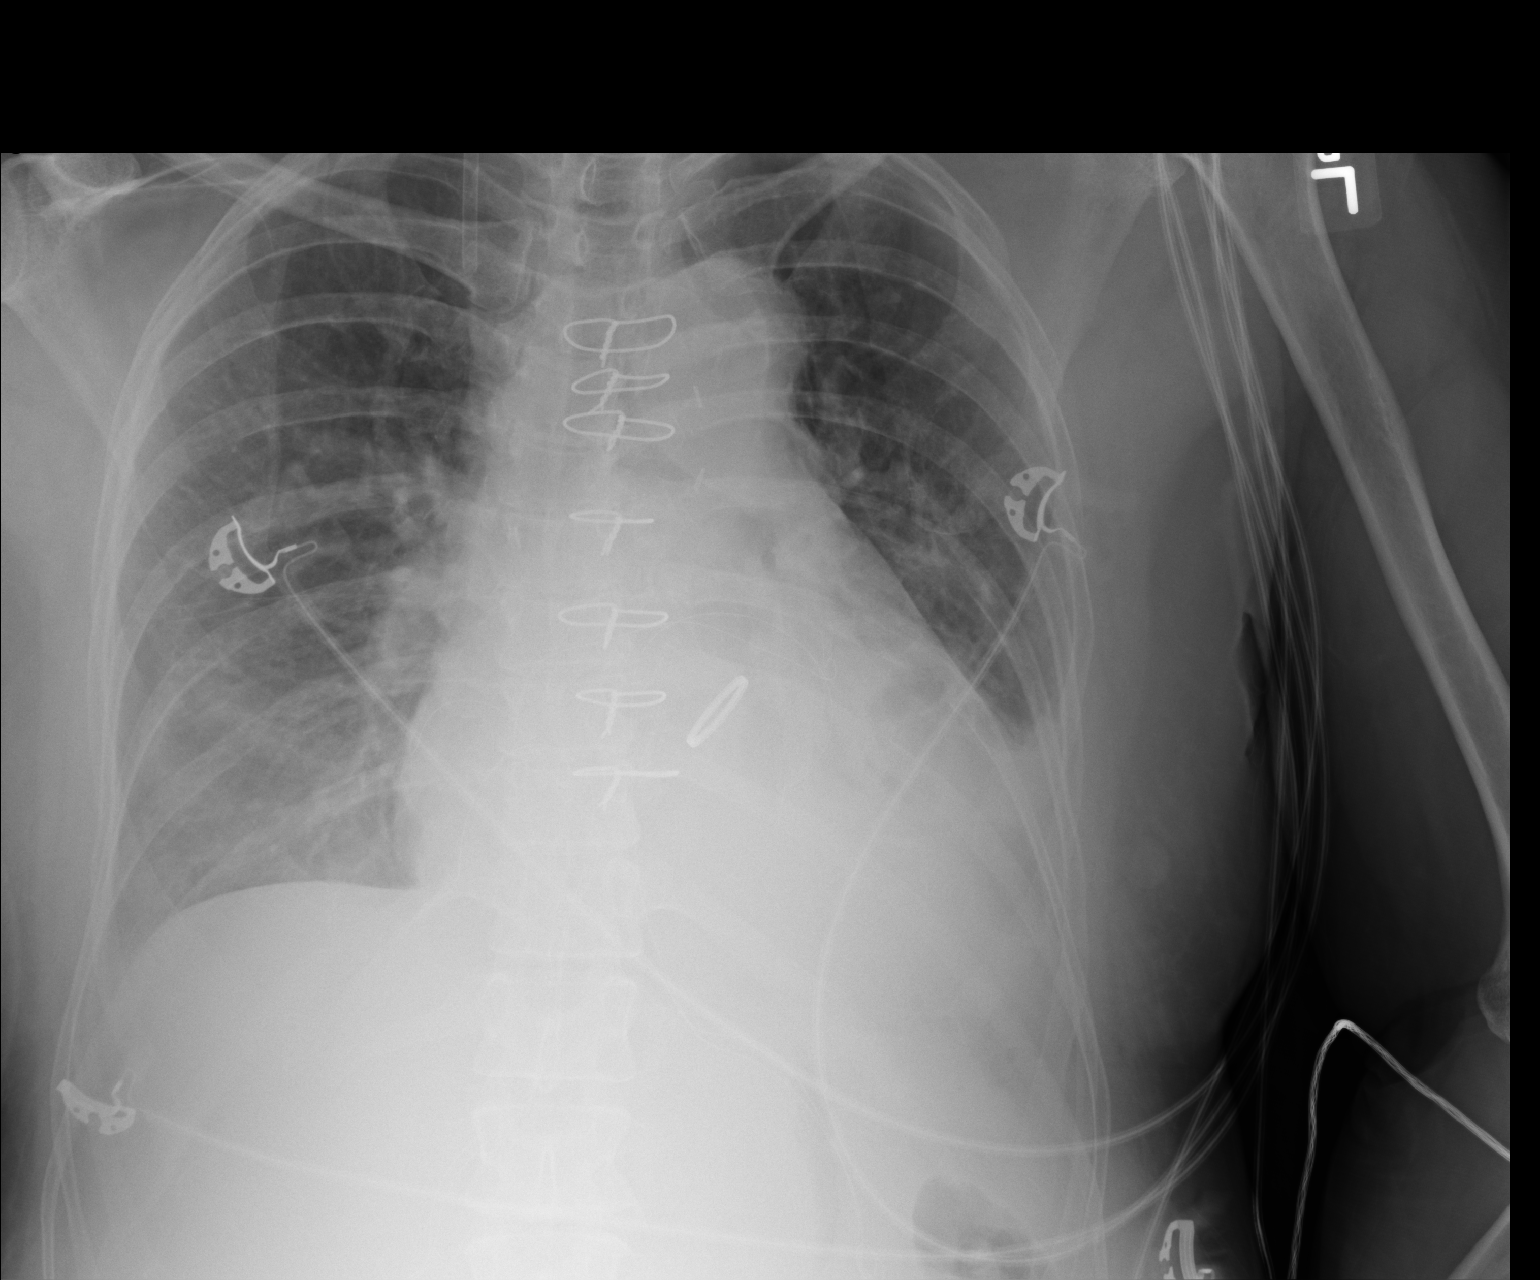

[1 of 1 positions shown; findings below may reference images not displayed]

FINDINGS: Portable AP semi upright view at 1113 hours. Swan-Ganz catheter
removed. Right IJ introducer sheath remains. Mediastinal tube and
left chest tube removed. No pneumothorax identified. Continued
veiling opacity at the left lung base. Mildly increased veiling
opacity at the right lung base. Stable cardiomegaly and mediastinal
contours. Aortic valve ring appears stable.
IMPRESSION: 1. Lines and tubes removed, right IJ introducer sheath remains.
2. No pneumothorax.
3. Continued left lung base an increased right lung base veiling
opacities favored due to pleural effusions with atelectasis.

## 2017-01-27 ENCOUNTER — Ambulatory Visit (INDEPENDENT_AMBULATORY_CARE_PROVIDER_SITE_OTHER): Payer: BLUE CROSS/BLUE SHIELD

## 2017-01-27 DIAGNOSIS — Z5181 Encounter for therapeutic drug level monitoring: Secondary | ICD-10-CM | POA: Diagnosis not present

## 2017-01-27 DIAGNOSIS — I351 Nonrheumatic aortic (valve) insufficiency: Secondary | ICD-10-CM

## 2017-01-27 DIAGNOSIS — Z952 Presence of prosthetic heart valve: Secondary | ICD-10-CM | POA: Diagnosis not present

## 2017-01-27 LAB — POCT INR: INR: 2.9

## 2017-01-27 NOTE — Patient Instructions (Signed)
Continue on same dosage 1 tablet (5mg ) everyday except 1.5 tablets (7.5mg ) on Mondays.  Recheck INR in 4 weeks.  Coumadin Clinic [phone]

## 2017-02-15 IMAGING — CR DG CHEST 2V
2 series · 2 of 2 positions shown · non-contrast
Comparison: 12/05/2015

CLINICAL DATA: Previous aortic valve replacement.  Follow-up.

EXAM:
CHEST  2 VIEW

[w chest pa]
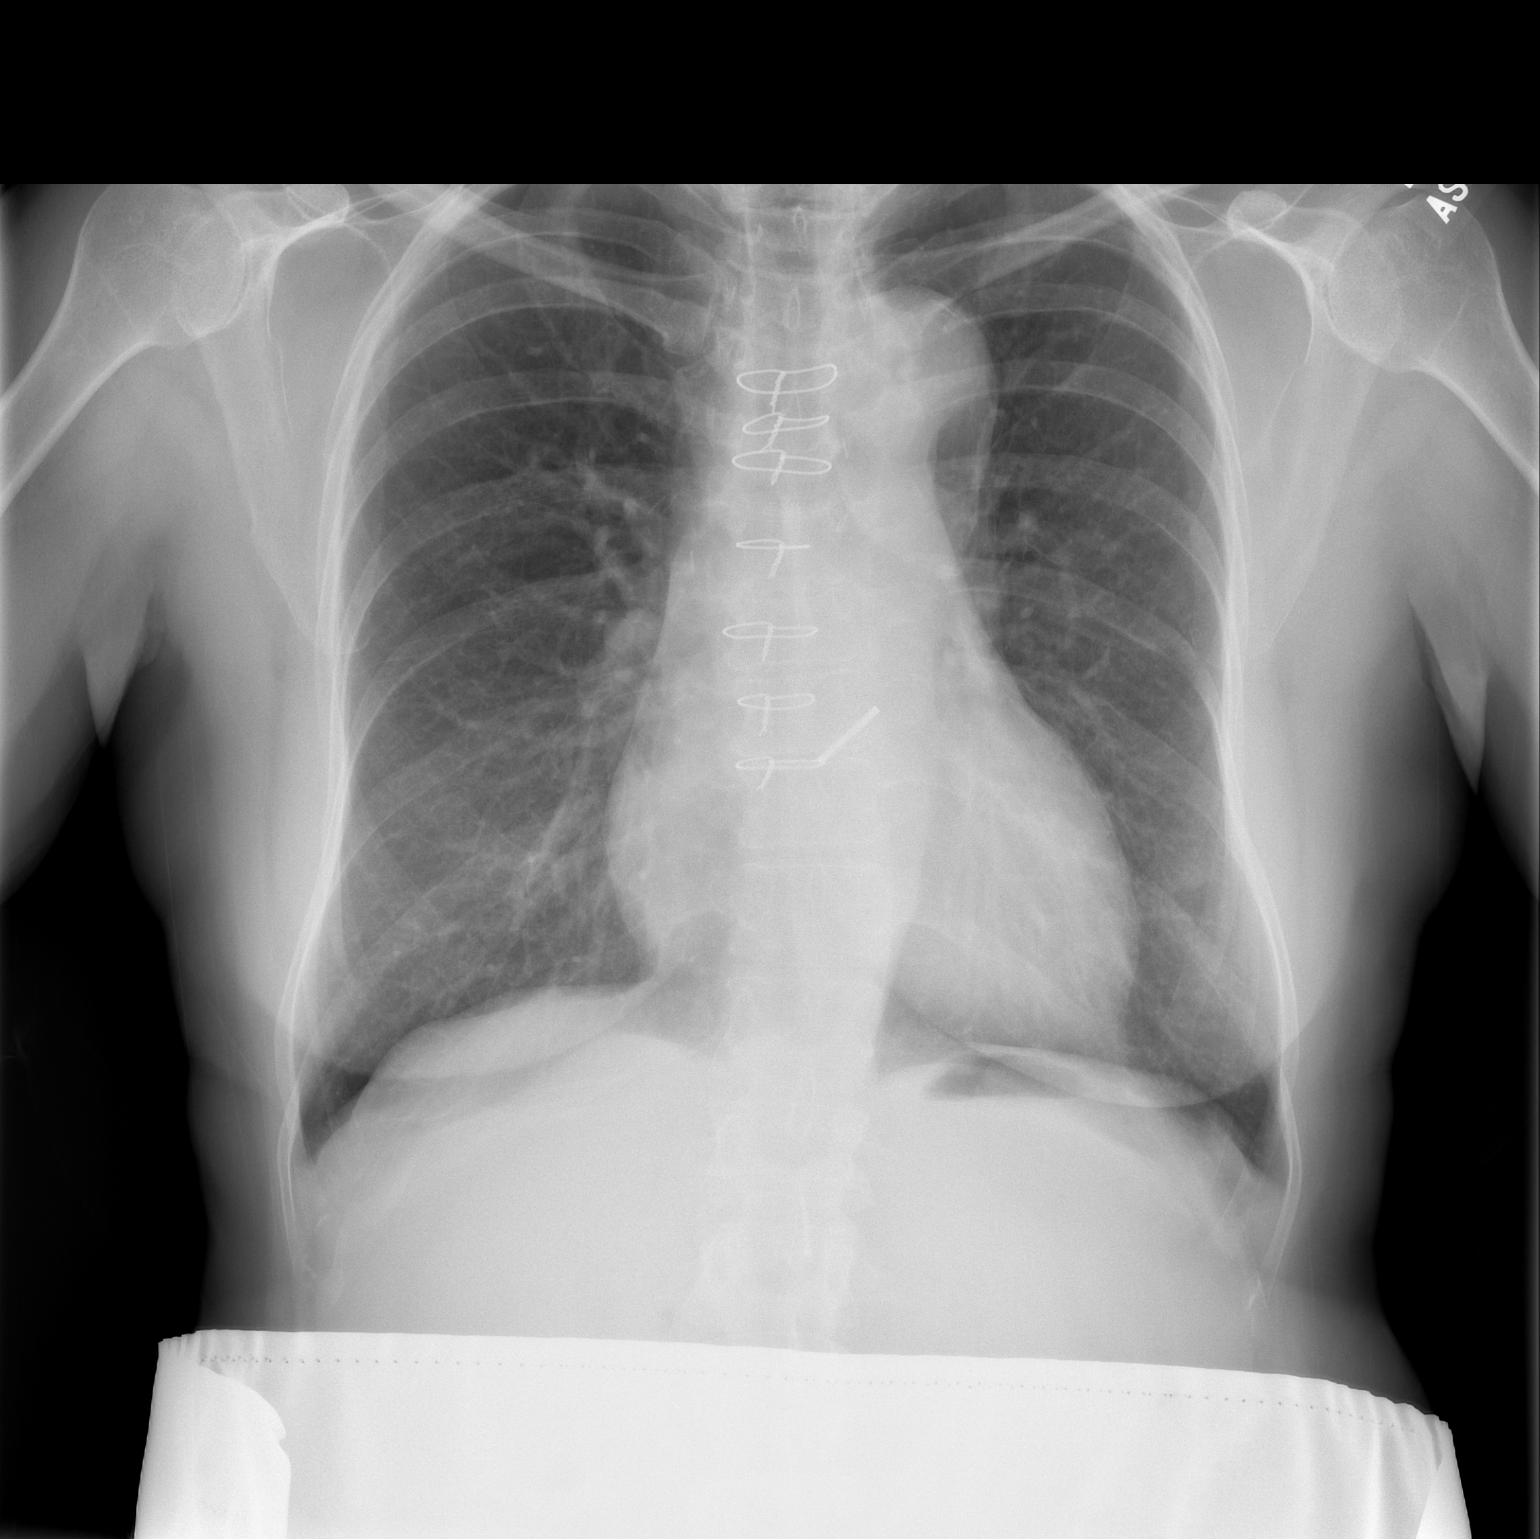

[w chest lat]
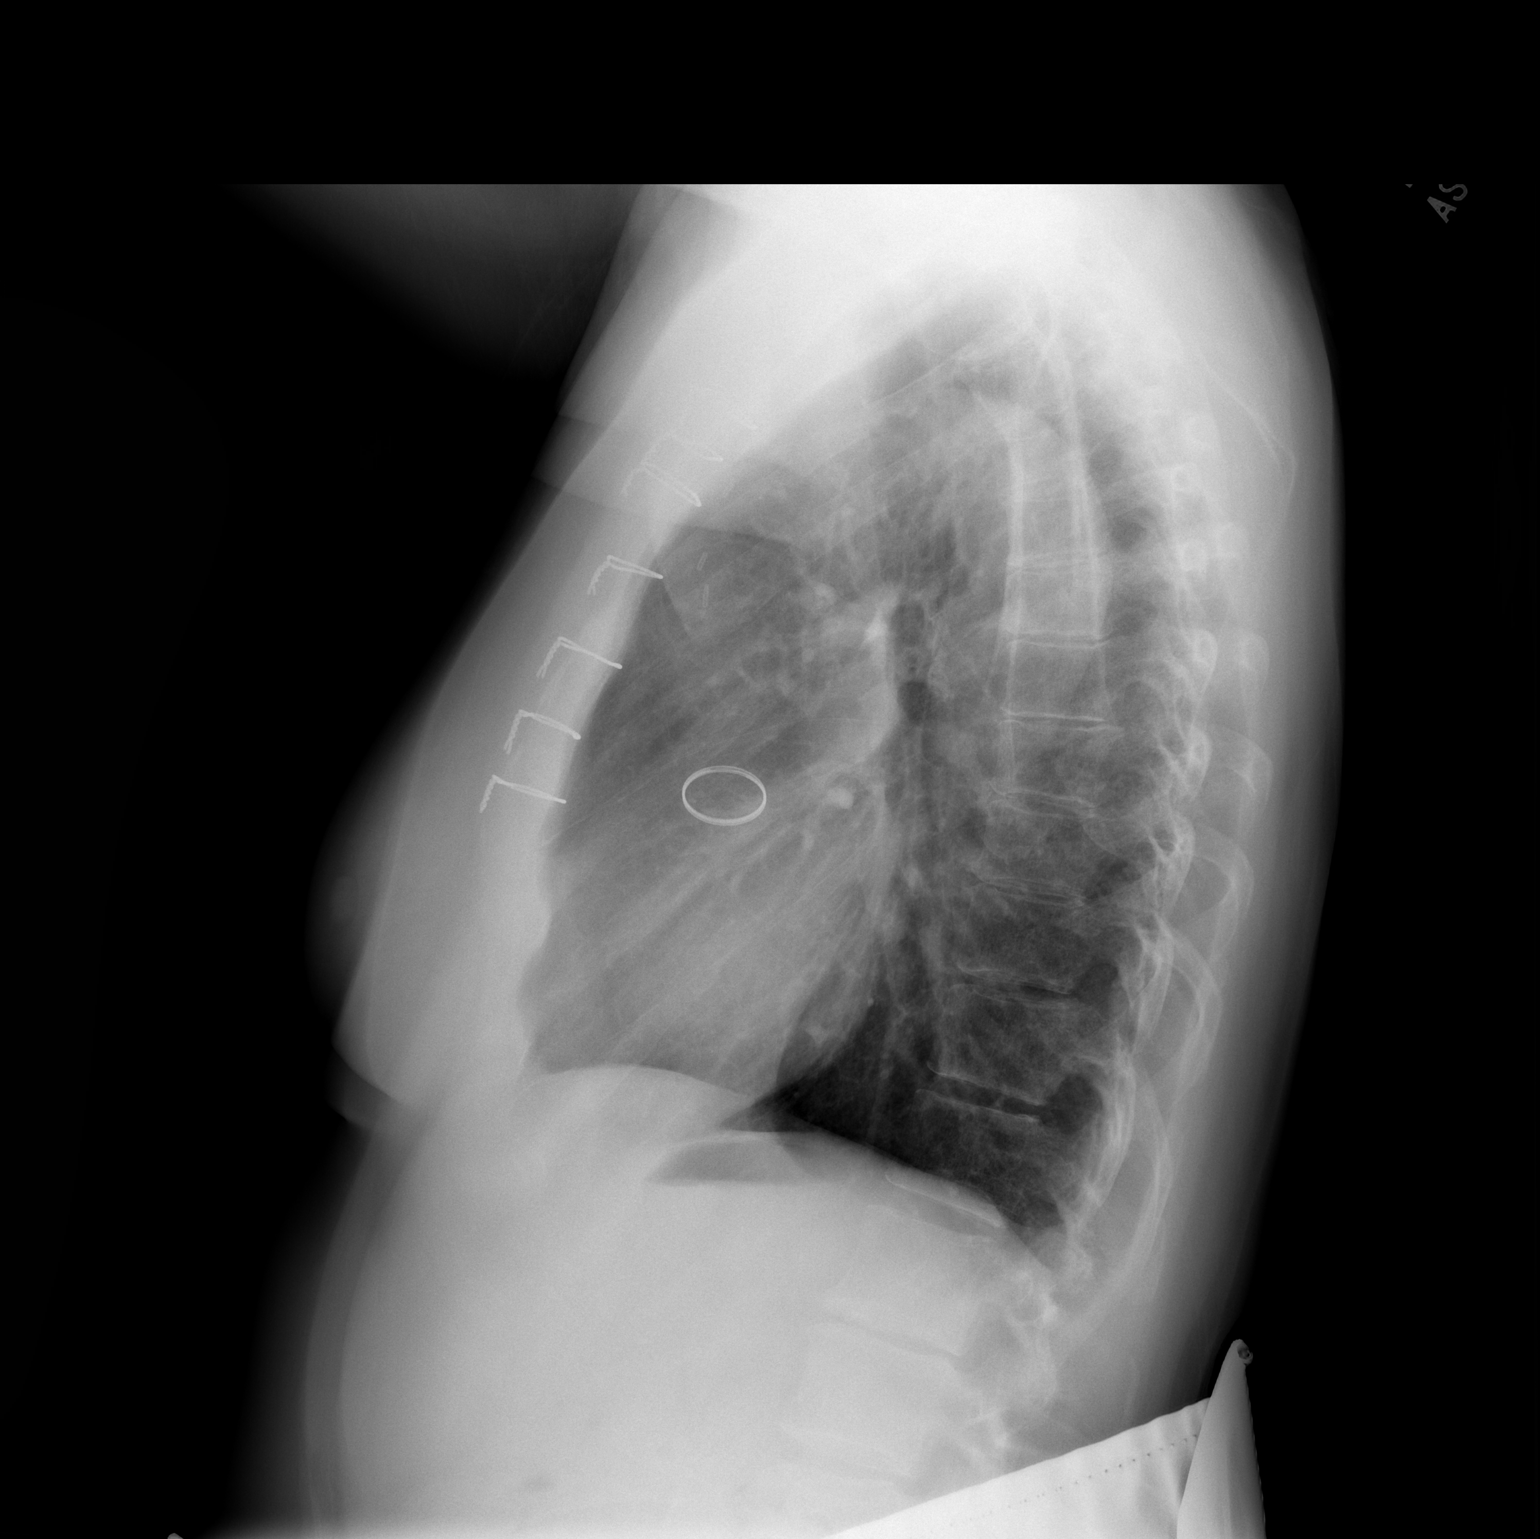

[2 of 2 positions shown; findings below may reference images not displayed]

FINDINGS: Previous median sternotomy and aortic valve replacement. Heart size
is normal. The aorta shows atherosclerosis and tortuosity. The
pulmonary vascularity is normal. The lungs are clear. No effusions.
Ordinary mild degenerative changes affect the spine.
IMPRESSION: Good appearance following aortic valve replacement. No active
disease presently. Aortic atherosclerosis.

## 2017-02-25 ENCOUNTER — Ambulatory Visit (INDEPENDENT_AMBULATORY_CARE_PROVIDER_SITE_OTHER): Payer: BLUE CROSS/BLUE SHIELD

## 2017-02-25 DIAGNOSIS — Z952 Presence of prosthetic heart valve: Secondary | ICD-10-CM

## 2017-02-25 DIAGNOSIS — Z5181 Encounter for therapeutic drug level monitoring: Secondary | ICD-10-CM | POA: Diagnosis not present

## 2017-02-25 DIAGNOSIS — I351 Nonrheumatic aortic (valve) insufficiency: Secondary | ICD-10-CM

## 2017-02-25 LAB — POCT INR: INR: 3.1

## 2017-02-25 NOTE — Patient Instructions (Addendum)
Take 1/2 tablet today, then resume same dosage 1 tablet (5mg ) everyday except 1.5 tablets (7.5mg ) on Mondays.  Recheck INR in 4 weeks.  Coumadin Clinic [phone]

## 2017-03-15 ENCOUNTER — Other Ambulatory Visit: Payer: Self-pay | Admitting: Cardiovascular Disease

## 2017-03-18 ENCOUNTER — Ambulatory Visit (INDEPENDENT_AMBULATORY_CARE_PROVIDER_SITE_OTHER): Payer: BLUE CROSS/BLUE SHIELD | Admitting: *Deleted

## 2017-03-18 ENCOUNTER — Other Ambulatory Visit: Payer: BLUE CROSS/BLUE SHIELD | Admitting: *Deleted

## 2017-03-18 DIAGNOSIS — Z95828 Presence of other vascular implants and grafts: Secondary | ICD-10-CM

## 2017-03-18 DIAGNOSIS — I351 Nonrheumatic aortic (valve) insufficiency: Secondary | ICD-10-CM | POA: Diagnosis not present

## 2017-03-18 DIAGNOSIS — E782 Mixed hyperlipidemia: Secondary | ICD-10-CM

## 2017-03-18 DIAGNOSIS — Z5181 Encounter for therapeutic drug level monitoring: Secondary | ICD-10-CM

## 2017-03-18 DIAGNOSIS — Z952 Presence of prosthetic heart valve: Secondary | ICD-10-CM

## 2017-03-18 LAB — LIPID PANEL
CHOL/HDL RATIO: 2.3 ratio (ref 0.0–4.4)
Cholesterol, Total: 171 mg/dL (ref 100–199)
HDL: 73 mg/dL (ref >39)
LDL Calculated: 75 mg/dL (ref 0–99)
Triglycerides: 117 mg/dL (ref 0–149)
VLDL Cholesterol Cal: 23 mg/dL (ref 5–40)

## 2017-03-18 LAB — HEPATIC FUNCTION PANEL
ALBUMIN: 4.8 g/dL (ref 3.5–5.5)
ALT: 30 IU/L (ref 0–32)
AST: 43 IU/L — ABNORMAL HIGH (ref 0–40)
Alkaline Phosphatase: 82 IU/L (ref 39–117)
BILIRUBIN TOTAL: 0.2 mg/dL (ref 0.0–1.2)
BILIRUBIN, DIRECT: 0.1 mg/dL (ref 0.00–0.40)
Total Protein: 7.3 g/dL (ref 6.0–8.5)

## 2017-03-18 LAB — POCT INR: INR: 2.6

## 2017-03-18 NOTE — Patient Instructions (Signed)
Description   Continue same dosage 1 tablet (5mg ) everyday except 1.5 tablets (7.5mg ) on Mondays.  Recheck INR in 4 weeks.  Coumadin Clinic [phone]

## 2017-03-23 DIAGNOSIS — M25561 Pain in right knee: Secondary | ICD-10-CM | POA: Diagnosis not present

## 2017-04-22 ENCOUNTER — Telehealth: Payer: Self-pay | Admitting: *Deleted

## 2017-04-22 ENCOUNTER — Ambulatory Visit (INDEPENDENT_AMBULATORY_CARE_PROVIDER_SITE_OTHER): Payer: BLUE CROSS/BLUE SHIELD | Admitting: *Deleted

## 2017-04-22 DIAGNOSIS — Z5181 Encounter for therapeutic drug level monitoring: Secondary | ICD-10-CM | POA: Diagnosis not present

## 2017-04-22 DIAGNOSIS — I351 Nonrheumatic aortic (valve) insufficiency: Secondary | ICD-10-CM | POA: Diagnosis not present

## 2017-04-22 DIAGNOSIS — Z952 Presence of prosthetic heart valve: Secondary | ICD-10-CM | POA: Diagnosis not present

## 2017-04-22 LAB — POCT INR: INR: 3.8

## 2017-04-22 NOTE — Patient Instructions (Signed)
Description   Please hold today's dose then continue same dosage 1 tablet (5mg ) everyday except 1.5 tablets (7.5mg ) on Mondays.  Recheck INR in 3 weeks.  Coumadin Clinic [phone]

## 2017-04-22 NOTE — Telephone Encounter (Signed)
Received message from front desk that pt was here for CVRR appointment today and was asking if she needed appointment with Dr. Clifton JamesMcAlhany sooner than planned follow up.  Message states pt has been having some of the same feelings she had in the hospital with her heart. I spoke with pt who reports one evening last week she had erratic heart rate. None since. No other problems I scheduled pt to see Ronie Spiesayna Dunn, PA tomorrow at 2:00

## 2017-04-23 ENCOUNTER — Encounter: Payer: Self-pay | Admitting: Physician Assistant

## 2017-04-23 ENCOUNTER — Ambulatory Visit: Payer: BLUE CROSS/BLUE SHIELD | Admitting: Physician Assistant

## 2017-04-23 NOTE — Progress Notes (Deleted)
Cardiology Office Note    Date:  04/23/2017  ID:  Marissa Bailey, DOB 20-Feb-1961, MRN [id number] PCP:  Madelin HeadingsPanosh, Wanda K, MD  Cardiologist:  Dr. Clifton JamesMcAlhany   Chief Complaint: ***  History of Present Illness:  Marissa Bailey is a 57 y.o. female with history of PACs and PSVT by Holter in 2013, severe AI and thoracic aortic aneurysm s/p Bentall with mechanical AVR 2017 (normal coronaries by cath), hyperlipidemia, anemia, amnxiety, PFO, migraines, seasonal allergies who presents for f/u. LAst echo 08/2016 showed mild LVH, EF 55-60%, trivial AI, AV prosthesis difficult to see, mild MR, mildly reduced RV function, mild RAE. She has been followed by lipid clinic for cholesterol. Last lipids 03/2017 showed trig 117, LDL 75 (much improved), AST 43, ALT 30. Last CBC/BMET 03/2016 wnl.   cbc bmet for routine screening tsh mg if needed  AI and thoracic aortic aneurysm s/p Bentall with mechanical AVR PACs PSVT Hyperlipidemia    Past Medical History:  Diagnosis Date  . Abnormal TSH   . Anemia   . Anxiety   . Aortic valve insufficiency    a. s/p Bentall with mechanical aortic valve replacement, with replacement of the ascending aorta and proximal aortic arch 12/2015.  Marland Kitchen. Hyperlipidemia, mixed   . Migraines    menstrual  . Patent foramen ovale   . Premature atrial contractions   . PSVT (paroxysmal supraventricular tachycardia) (HCC)    a. 48 hour monitor in 2013 with NSR, PACs and several short runs of SVT (15 beats).  . Seasonal allergies   . Thoracic aortic aneurysm (HCC)    a. s/p Bentall 2017.    Past Surgical History:  Procedure Laterality Date  . BENTALL PROCEDURE N/A 12/03/2015   Procedure: BENTALL PROCEDURE USING 21MM ST. JUDE AORTIC VALVE CONDUIT;  Surgeon: Alleen BorneBryan K Bartle, MD;  Location: [facility] OR;  Service: Open Heart Surgery;  Laterality: N/A;  CIRC ARREST  . CARDIAC CATHETERIZATION N/A 11/15/2015   Procedure: Right/Left Heart Cath and Coronary Angiography;  Surgeon: Kathleene Hazelhristopher D McAlhany,  MD;  [facility];  Service: Cardiovascular;  Laterality: N/A;  . DILATION AND CURETTAGE OF UTERUS    . EXPLORATORY LAPAROTOMY     for tubal pregnancy  . EYE SURGERY Bilateral    lasik  . REPLACEMENT ASCENDING AORTA N/A 12/03/2015   Procedure: REPLACEMENT ASCENDING AORTA USING 28MM HEMASHIELD GRAFT;  Surgeon: Alleen BorneBryan K Bartle, MD;  Location: [facility] OR;  Service: Open Heart Surgery;  Laterality: N/A;  . TEE WITHOUT CARDIOVERSION N/A 11/07/2015   Procedure: TRANSESOPHAGEAL ECHOCARDIOGRAM (TEE);  Surgeon: Chrystie NoseKenneth C Hilty, MD;  [facility];  Service: Cardiovascular;  Laterality: N/A;  . TEE WITHOUT CARDIOVERSION N/A 12/03/2015   Procedure: TRANSESOPHAGEAL ECHOCARDIOGRAM (TEE);  Surgeon: Alleen BorneBryan K Bartle, MD;  [facility];  Service: Open Heart Surgery;  Laterality: N/A;  . vein surgery lower extremity      Current Medications: No outpatient medications have been marked as taking for the 04/23/17 encounter (Appointment) with Laurann Montanaunn, Dayna N, PA-C.   ***   Allergies:   Patient has no known allergies.   Social History   Socioeconomic History  . Marital status: Married    Spouse name: Not on file  . Number of children: 0  . Years of education: Not on file  . Highest education level: Not on file  Social Needs  . Financial resource strain: Not on file  . Food insecurity - worry: Not on file  . Food insecurity - inability: Not on  file  . Transportation needs - medical: Not on file  . Transportation needs - non-medical: Not on file  Occupational History  . Not on file  Tobacco Use  . Smoking status: Never Smoker  . Smokeless tobacco: Never Used  Substance and Sexual Activity  . Alcohol use: Yes    Comment: socially  . Drug use: No  . Sexual activity: Not on file  Other Topics Concern  . 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 Heart attack Mother 79       brain impairment tobacco dm  . Diabetes Mother   . Hyperlipidemia Mother   . Heart disease Sister        whole in heart age 37  . Cancer Paternal Grandmother        breast  . Stroke Paternal Grandmother   . Heart failure Brother    ***  ROS:   Please see the history of present illness. Otherwise, review of systems is positive for ***.  All other systems are reviewed and otherwise negative.    PHYSICAL EXAM:   VS:  There were no vitals taken for this visit.  BMI: There is no height or weight on file to calculate BMI. GEN: Well nourished, well developed, in no acute distress  HEENT: normocephalic, atraumatic Neck: no JVD, carotid bruits, or masses Cardiac: ***RRR; no murmurs, rubs, or gallops, no edema  Respiratory:  clear to auscultation bilaterally, normal work of breathing GI: soft, nontender, nondistended, + BS MS: no deformity or atrophy  Skin: warm and dry, no rash Neuro:  Alert and Oriented x 3, Strength and sensation are intact, follows commands Psych: euthymic mood, full affect  Wt Readings from Last 3 Encounters:  07/21/16 156 lb 12.8 oz (71.1 kg)  04/28/16 155 lb 1.9 oz (70.4 kg)  03/31/16 154 lb (69.9 kg)      Studies/Labs Reviewed:   EKG:  EKG was ordered today and personally reviewed by me and demonstrates *** EKG was not ordered today.***  Recent Labs: 03/18/2017: ALT 30   Lipid Panel    Component Value Date/Time   CHOL 171 03/18/2017 0817   TRIG 117 03/18/2017 0817   HDL 73 03/18/2017 0817   CHOLHDL 2.3 03/18/2017 0817   CHOLHDL 2 09/18/2009 1621   VLDL 21.8 09/18/2009 1621   LDLCALC 75 03/18/2017 0817   LDLDIRECT 180 (H) 12/16/2016 1510    Additional studies/ records that were reviewed today include: Summarized above.***    ASSESSMENT & PLAN:   1. ***  Disposition: F/u with ***   Medication Adjustments/Labs and Tests  Ordered: Current medicines are reviewed at length with the patient today.  Concerns regarding medicines are outlined above. Medication changes, Labs and Tests ordered today are summarized above and listed in the Patient Instructions accessible in Encounters.   Signed, Laurann Montana, PA-C  04/23/2017 7:47 AM    [facility] [address] [place], [place], [place]  [address] Phone: [phone]; Fax: [phone]

## 2017-05-08 ENCOUNTER — Telehealth: Payer: Self-pay | Admitting: Physician Assistant

## 2017-05-08 NOTE — Telephone Encounter (Signed)
Patient calling, states that she has a question to discuss in regards to coumadin medication. Patient is currently at work but states that you may call mobile number if unsuccessful at reaching her at the work number

## 2017-05-08 NOTE — Telephone Encounter (Signed)
Returned call to the pt & she stated that has a tooth extraction consultation on Tuesday. She wanted to know if she needed to hold her Coumadin for her extraction. Advised that her dentist should instruct her if she needs to hold & contact Cardiologist if needed. She will find out more info on Tuesday & report info to [facility] on Wednesday at the appt & advised her to give our number to them if needed.

## 2017-05-08 NOTE — Telephone Encounter (Signed)
LMOM unable to reach at work

## 2017-05-12 LAB — POCT INR: INR: 2.9

## 2017-05-13 ENCOUNTER — Ambulatory Visit (INDEPENDENT_AMBULATORY_CARE_PROVIDER_SITE_OTHER): Payer: BLUE CROSS/BLUE SHIELD | Admitting: Cardiovascular Disease

## 2017-05-13 DIAGNOSIS — Z5181 Encounter for therapeutic drug level monitoring: Secondary | ICD-10-CM | POA: Diagnosis not present

## 2017-05-13 DIAGNOSIS — Z95828 Presence of other vascular implants and grafts: Secondary | ICD-10-CM

## 2017-05-13 DIAGNOSIS — Z952 Presence of prosthetic heart valve: Secondary | ICD-10-CM | POA: Diagnosis not present

## 2017-05-13 NOTE — Patient Instructions (Signed)
Description   Continue same dosage 1 tablet (5mg ) everyday except 1.5 tablets (7.5mg ) on Mondays.  Recheck INR in 4 weeks.  Coumadin Clinic [phone]

## 2017-05-28 DIAGNOSIS — M25561 Pain in right knee: Secondary | ICD-10-CM | POA: Diagnosis not present

## 2017-05-29 ENCOUNTER — Other Ambulatory Visit: Payer: Self-pay | Admitting: Cardiology

## 2017-06-10 DIAGNOSIS — M25561 Pain in right knee: Secondary | ICD-10-CM | POA: Diagnosis not present

## 2017-06-11 ENCOUNTER — Ambulatory Visit (INDEPENDENT_AMBULATORY_CARE_PROVIDER_SITE_OTHER): Payer: BLUE CROSS/BLUE SHIELD | Admitting: *Deleted

## 2017-06-11 DIAGNOSIS — Z5181 Encounter for therapeutic drug level monitoring: Secondary | ICD-10-CM

## 2017-06-11 DIAGNOSIS — Z952 Presence of prosthetic heart valve: Secondary | ICD-10-CM

## 2017-06-11 DIAGNOSIS — I351 Nonrheumatic aortic (valve) insufficiency: Secondary | ICD-10-CM | POA: Diagnosis not present

## 2017-06-11 LAB — POCT INR: INR: 2.7

## 2017-06-11 NOTE — Patient Instructions (Signed)
Description   Continue same dosage 1 tablet (5mg) everyday except 1.5 tablets (7.5mg) on Mondays.  Recheck INR in 4 weeks.  Coumadin Clinic [phone]     

## 2017-07-03 DIAGNOSIS — L570 Actinic keratosis: Secondary | ICD-10-CM | POA: Diagnosis not present

## 2017-07-03 DIAGNOSIS — Z85828 Personal history of other malignant neoplasm of skin: Secondary | ICD-10-CM | POA: Diagnosis not present

## 2017-07-03 DIAGNOSIS — D2262 Melanocytic nevi of left upper limb, including shoulder: Secondary | ICD-10-CM | POA: Diagnosis not present

## 2017-07-03 DIAGNOSIS — D225 Melanocytic nevi of trunk: Secondary | ICD-10-CM | POA: Diagnosis not present

## 2017-07-03 DIAGNOSIS — D692 Other nonthrombocytopenic purpura: Secondary | ICD-10-CM | POA: Diagnosis not present

## 2017-07-09 ENCOUNTER — Telehealth: Payer: Self-pay | Admitting: *Deleted

## 2017-07-09 ENCOUNTER — Ambulatory Visit (INDEPENDENT_AMBULATORY_CARE_PROVIDER_SITE_OTHER): Payer: BLUE CROSS/BLUE SHIELD | Admitting: *Deleted

## 2017-07-09 DIAGNOSIS — I351 Nonrheumatic aortic (valve) insufficiency: Secondary | ICD-10-CM | POA: Diagnosis not present

## 2017-07-09 DIAGNOSIS — Z5181 Encounter for therapeutic drug level monitoring: Secondary | ICD-10-CM

## 2017-07-09 DIAGNOSIS — Z952 Presence of prosthetic heart valve: Secondary | ICD-10-CM | POA: Diagnosis not present

## 2017-07-09 LAB — POCT INR: INR: 4.3

## 2017-07-09 NOTE — Telephone Encounter (Signed)
I spoke with pt and gave her information from Dr. McAlhany 

## 2017-07-09 NOTE — Telephone Encounter (Signed)
-----   Message from Kathleene Hazel, MD sent at 07/09/2017  1:51 PM EDT ----- Regarding: RE: Medication question  Pat, I would have her hold it while she is on Naproxen and resume when she has stopped the Naproxen. Thayer Ohm  ----- Message ----- From: Raul Del, RN Sent: 07/09/2017  11:11 AM To: Dossie Arbour, RN, # Subject: Medication question                            Pt was prescribed Naproxen PRN by Ortho MD for Rt knee pain after receiving cortisone injection. She is currently using Naproxen PRN 1 tab in AM and occ 1 tab in PM. She has not been taking her ASA since she started the Naproxen in February( her own instruction)  She wants to know if she should resume her ASA or continue to hold it while using the Naproxen? She states she has a Ortho f/u next week. Please call pt with your recommendation @ [phone] Thank you.

## 2017-07-15 DIAGNOSIS — S83206D Unspecified tear of unspecified meniscus, current injury, right knee, subsequent encounter: Secondary | ICD-10-CM | POA: Diagnosis not present

## 2017-07-15 DIAGNOSIS — M25561 Pain in right knee: Secondary | ICD-10-CM | POA: Diagnosis not present

## 2017-07-23 ENCOUNTER — Ambulatory Visit (INDEPENDENT_AMBULATORY_CARE_PROVIDER_SITE_OTHER): Payer: BLUE CROSS/BLUE SHIELD | Admitting: *Deleted

## 2017-07-23 ENCOUNTER — Other Ambulatory Visit: Payer: Self-pay | Admitting: Cardiovascular Disease

## 2017-07-23 DIAGNOSIS — I351 Nonrheumatic aortic (valve) insufficiency: Secondary | ICD-10-CM | POA: Diagnosis not present

## 2017-07-23 DIAGNOSIS — Z952 Presence of prosthetic heart valve: Secondary | ICD-10-CM

## 2017-07-23 DIAGNOSIS — Z5181 Encounter for therapeutic drug level monitoring: Secondary | ICD-10-CM

## 2017-07-23 LAB — POCT INR: INR: 2.7 (ref 2.0–3.0)

## 2017-07-23 NOTE — Patient Instructions (Signed)
Description    Continue same dosage 1 tablet ( ) everyday except 1.5 tablets (7.5mg ) on Mondays.  Recheck INR in 3 weeks.  Coumadin Clinic [phone]

## 2017-08-11 DIAGNOSIS — H0014 Chalazion left upper eyelid: Secondary | ICD-10-CM | POA: Diagnosis not present

## 2017-08-13 ENCOUNTER — Ambulatory Visit (INDEPENDENT_AMBULATORY_CARE_PROVIDER_SITE_OTHER): Payer: BLUE CROSS/BLUE SHIELD | Admitting: *Deleted

## 2017-08-13 DIAGNOSIS — I351 Nonrheumatic aortic (valve) insufficiency: Secondary | ICD-10-CM

## 2017-08-13 DIAGNOSIS — Z952 Presence of prosthetic heart valve: Secondary | ICD-10-CM | POA: Diagnosis not present

## 2017-08-13 DIAGNOSIS — Z5181 Encounter for therapeutic drug level monitoring: Secondary | ICD-10-CM

## 2017-08-13 LAB — POCT INR: INR: 4.8 — AB (ref 2.0–3.0)

## 2017-08-13 NOTE — Patient Instructions (Signed)
Description    Skip today and tomorrow's dose, then continue taking 1 tablet (5mg ) everyday except 1.5 tablets (7.5mg ) on Mondays. Will not change dose, patient will increase intake of leafy green vegetables.  Recheck INR in 2 weeks.  Coumadin Clinic [phone]

## 2017-08-24 ENCOUNTER — Other Ambulatory Visit: Payer: Self-pay | Admitting: Cardiovascular Disease

## 2017-08-26 ENCOUNTER — Ambulatory Visit (INDEPENDENT_AMBULATORY_CARE_PROVIDER_SITE_OTHER): Payer: BLUE CROSS/BLUE SHIELD | Admitting: *Deleted

## 2017-08-26 DIAGNOSIS — I351 Nonrheumatic aortic (valve) insufficiency: Secondary | ICD-10-CM

## 2017-08-26 DIAGNOSIS — Z5181 Encounter for therapeutic drug level monitoring: Secondary | ICD-10-CM | POA: Diagnosis not present

## 2017-08-26 DIAGNOSIS — Z952 Presence of prosthetic heart valve: Secondary | ICD-10-CM

## 2017-08-26 LAB — POCT INR: INR: 2.5 (ref 2.0–3.0)

## 2017-08-26 NOTE — Patient Instructions (Signed)
Description   Continue taking 1 tablet (5mg ) everyday except 1.5 tablets (7.5mg ) on Mondays.  Recheck INR in 3 weeks.  Coumadin Clinic [phone]

## 2017-09-14 ENCOUNTER — Ambulatory Visit (INDEPENDENT_AMBULATORY_CARE_PROVIDER_SITE_OTHER): Payer: BLUE CROSS/BLUE SHIELD | Admitting: *Deleted

## 2017-09-14 DIAGNOSIS — Z952 Presence of prosthetic heart valve: Secondary | ICD-10-CM | POA: Diagnosis not present

## 2017-09-14 DIAGNOSIS — I351 Nonrheumatic aortic (valve) insufficiency: Secondary | ICD-10-CM | POA: Diagnosis not present

## 2017-09-14 DIAGNOSIS — Z5181 Encounter for therapeutic drug level monitoring: Secondary | ICD-10-CM | POA: Diagnosis not present

## 2017-09-14 LAB — POCT INR: INR: 2.8 (ref 2.0–3.0)

## 2017-09-14 NOTE — Patient Instructions (Signed)
Description   Continue taking 1 tablet (5mg ) everyday except 1.5 tablets (7.5mg ) on Mondays.  Recheck INR in 4 weeks.  [facility]madin Clinic [phone]

## 2017-09-19 ENCOUNTER — Other Ambulatory Visit: Payer: Self-pay | Admitting: Cardiovascular Disease

## 2017-09-23 DIAGNOSIS — M25561 Pain in right knee: Secondary | ICD-10-CM | POA: Diagnosis not present

## 2017-09-23 DIAGNOSIS — S83206D Unspecified tear of unspecified meniscus, current injury, right knee, subsequent encounter: Secondary | ICD-10-CM | POA: Diagnosis not present

## 2017-10-12 ENCOUNTER — Ambulatory Visit (INDEPENDENT_AMBULATORY_CARE_PROVIDER_SITE_OTHER): Payer: BLUE CROSS/BLUE SHIELD | Admitting: *Deleted

## 2017-10-12 DIAGNOSIS — Z5181 Encounter for therapeutic drug level monitoring: Secondary | ICD-10-CM | POA: Diagnosis not present

## 2017-10-12 DIAGNOSIS — I351 Nonrheumatic aortic (valve) insufficiency: Secondary | ICD-10-CM | POA: Diagnosis not present

## 2017-10-12 DIAGNOSIS — Z952 Presence of prosthetic heart valve: Secondary | ICD-10-CM

## 2017-10-12 LAB — POCT INR: INR: 2.7 (ref 2.0–3.0)

## 2017-10-12 NOTE — Patient Instructions (Addendum)
Description   Continue taking 1 tablet (5mg ) everyday except 1.5 tablets (7.5mg ) on Mondays.  Recheck INR in 2 weeks.  C[facility]in Clinic [phone]

## 2017-10-21 DIAGNOSIS — H52203 Unspecified astigmatism, bilateral: Secondary | ICD-10-CM | POA: Diagnosis not present

## 2017-10-21 DIAGNOSIS — H5213 Myopia, bilateral: Secondary | ICD-10-CM | POA: Diagnosis not present

## 2017-10-21 DIAGNOSIS — H524 Presbyopia: Secondary | ICD-10-CM | POA: Diagnosis not present

## 2017-10-26 ENCOUNTER — Ambulatory Visit: Payer: BLUE CROSS/BLUE SHIELD | Admitting: Cardiovascular Disease

## 2017-10-26 ENCOUNTER — Ambulatory Visit (INDEPENDENT_AMBULATORY_CARE_PROVIDER_SITE_OTHER): Payer: BLUE CROSS/BLUE SHIELD

## 2017-10-26 VITALS — BP 130/64 | HR 60 | Ht 64.0 in | Wt 156.0 lb

## 2017-10-26 DIAGNOSIS — Z5181 Encounter for therapeutic drug level monitoring: Secondary | ICD-10-CM | POA: Diagnosis not present

## 2017-10-26 DIAGNOSIS — E782 Mixed hyperlipidemia: Secondary | ICD-10-CM

## 2017-10-26 DIAGNOSIS — I351 Nonrheumatic aortic (valve) insufficiency: Secondary | ICD-10-CM | POA: Diagnosis not present

## 2017-10-26 DIAGNOSIS — Z952 Presence of prosthetic heart valve: Secondary | ICD-10-CM

## 2017-10-26 DIAGNOSIS — I712 Thoracic aortic aneurysm, without rupture, unspecified: Secondary | ICD-10-CM

## 2017-10-26 LAB — BASIC METABOLIC PANEL
BUN / CREAT RATIO: 17 (ref 9–23)
BUN: 16 mg/dL (ref 6–24)
CHLORIDE: 101 mmol/L (ref 96–106)
CO2: 23 mmol/L (ref 20–29)
CREATININE: 0.96 mg/dL (ref 0.57–1.00)
Calcium: 10.2 mg/dL (ref 8.7–10.2)
GFR calc Af Amer: 75 mL/min/{1.73_m2} (ref >59)
GFR calc non Af Amer: 65 mL/min/{1.73_m2} (ref >59)
GLUCOSE: 86 mg/dL (ref 65–99)
Potassium: 4.3 mmol/L (ref 3.5–5.2)
SODIUM: 140 mmol/L (ref 134–144)

## 2017-10-26 LAB — POCT INR: INR: 2.6 (ref 2.0–3.0)

## 2017-10-26 MED ORDER — LISINOPRIL 5 MG PO TABS: 5.0000 mg | ORAL_TABLET | Freq: Every day | ORAL | 3 refills | Status: DC

## 2017-10-26 MED ORDER — METOPROLOL SUCCINATE ER 25 MG PO TB24: 25.0000 mg | ORAL_TABLET | Freq: Every day | ORAL | 3 refills | Status: DC

## 2017-10-26 MED ORDER — METOPROLOL SUCCINATE ER 50 MG PO TB24: 50.0000 mg | ORAL_TABLET | Freq: Every day | ORAL | 3 refills | Status: DC

## 2017-10-26 NOTE — Patient Instructions (Signed)
Medication Instructions:  Your physician has recommended you make the following change in your medication:  Change Toprol to 25 mg by mouth daily. Take lisinopril 5 mg by mouth daily.   Labwork: Lab work to be done today.--BMP  Your physician recommends that you return for lab work in: January 2020.  This will be fasting.  Lipid and Liver profiles.    Testing/Procedures: Non-Cardiac CT Angiography (CTA), is a special type of CT scan that uses a computer to produce multi-dimensional views of major blood vessels throughout the body. In CT angiography, a contrast material is injected through an IV to help visualize the blood vessels   Follow-Up: Your physician recommends that you schedule a follow-up appointment in: 12 months.  Please call our office in about 8 months to schedule this appointment   Any Other Special Instructions Will Be Listed Below (If Applicable).     If you need a refill on your cardiac medications before your next appointment, please call your pharmacy.

## 2017-10-26 NOTE — Patient Instructions (Signed)
Description   Continue taking 1 tablet (5mg ) everyday except 1.5 tablets (7.5mg ) on Mondays.  Recheck INR in 6 weeks.  Coumadin Clinic [phone]

## 2017-10-26 NOTE — Progress Notes (Signed)
Chief Complaint  Patient presents with  . Follow-up    aortic valve disease    History of Present Illness: 57 yo female with history of hyperlipidemia, SVT, PACs and severe aortic valve insufficiency now s/p Bentall procedure with mechanical aortic valve replacement here today for cardiac follow up. I have followed her for palpitations and valve disease. She is known to have PACs and SVT documented by cardiac monitor in 2013. She was found to have severe aortic valve insufficiency in 2017. TEE on 96/17 with normal LV function, severe AI, mild MR, PFO. Cardiac cath 11/15/15 with normal coronary arteries. Dr. Laneta Simmers performed a Bentall procedure 12/03/15 with placement of a mechanical AVR with replacement of the ascending aorta and proximal aortic arch. Echo June 2018 arranged due to prominent murmur. The mechanical aortic valve is working well. LV function was normal. Lisinopril reduced to 5 mg daily at her office visit in October 2018 due to weakness and soft BP.   She is here today for follow up. The patient denies any chest pain, dyspnea, palpitations, lower extremity edema, orthopnea, PND, dizziness, near syncope or syncope. She continues to have fatigue. She has occasional upper back pain.   Primary Care Physician: Madelin Headings, MD   Past Medical History:  Diagnosis Date  . Abnormal TSH   . Anemia   . Anxiety   . Aortic valve insufficiency    a. s/p Bentall with mechanical aortic valve replacement, with replacement of the ascending aorta and proximal aortic arch 12/2015.  Marland Kitchen Hyperlipidemia, mixed   . Migraines    menstrual  . Patent foramen ovale   . Premature atrial contractions   . PSVT (paroxysmal supraventricular tachycardia) (HCC)    a. 48 hour monitor in 2013 with NSR, PACs and several short runs of SVT (15 beats).  . Seasonal allergies   . Thoracic aortic aneurysm (HCC)    a. s/p Bentall 2017.    Past Surgical History:  Procedure Laterality Date  . BENTALL PROCEDURE  N/A 12/03/2015   Procedure: BENTALL PROCEDURE USING ST. JUDE AORTIC VALVE CONDUIT;  Surgeon: Alleen Borne, MD;  Location: [facility] OR;  Service: Open Heart Surgery;  Laterality: N/A;  CIRC ARREST  . CARDIAC CATHETERIZATION N/A 11/15/2015   Procedure: Right/Left Heart Cath and Coronary Angiography;  Surgeon: Kathleene Hazel, MD;  Location: [facility] INVASIVE CV LAB;  Service: Cardiovascular;  Laterality: N/A;  . DILATION AND CURETTAGE OF UTERUS    . EXPLORATORY LAPAROTOMY     for tubal pregnancy  . EYE SURGERY Bilateral    lasik  . REPLACEMENT ASCENDING AORTA N/A 12/03/2015   Procedure: REPLACEMENT ASCENDING AORTA USING HEMASHIELD GRAFT;  Surgeon: Alleen Borne, MD;  Location: [facility] OR;  Service: Open Heart Surgery;  Laterality: N/A;  . TEE WITHOUT CARDIOVERSION N/A 11/07/2015   Procedure: TRANSESOPHAGEAL ECHOCARDIOGRAM (TEE);  Surgeon: Chrystie Nose, MD;  Location: [facility] ENDOSCOPY;  Service: Cardiovascular;  Laterality: N/A;  . TEE WITHOUT CARDIOVERSION N/A 12/03/2015   Procedure: TRANSESOPHAGEAL ECHOCARDIOGRAM (TEE);  Surgeon: Alleen Borne, MD;  Location[facility];  Service: Open Heart Surgery;  Laterality: N/A;  . vein surgery lower extremity      Current Outpatient Medications  Medication Sig Dispense Refill  . acetaminophen (TYLENOL) 325 MG tablet Take 2 tablets (650 mg total) by mouth every 6 (six) hours as needed for mild pain.    Marland Kitchen amoxicillin (AMOXIL) 500 MG tablet Take 4 tablets 30-60 minutes prior to dental appointment. 4 tablet  4  . aspirin 81 MG tablet Take 81 mg by mouth at bedtime.     . Cholecalciferol (VITAMIN D) 1000 UNITS capsule Take 2,000 Units by mouth daily.     . fish oil-omega-3 fatty acids 1000 MG capsule Take 2 g by mouth daily.      Marland Kitchen glucosamine-chondroitin 500-400 MG tablet Take 1 tablet by mouth daily.    Marland Kitchen MAGNESIUM CARBONATE PO Take 1 tablet by mouth daily.     . Multiple Vitamin (MULTIVITAMIN WITH MINERALS) TABS Take 1 tablet by mouth daily.    . NON FORMULARY  Apply 0.5 mLs topically daily. 70/30 mix of estriol and estradiol in a cream base. Compounded by [facility].    Marland Kitchen PRESCRIPTION MEDICATION Apply 0.1 mLs topically daily. Testosterone 2% cream compounded    . progesterone (PROMETRIUM) 100 MG capsule Take 100 mg by mouth at bedtime.    . rosuvastatin (CRESTOR) 20 MG tablet Take 1 tablet (20 mg total) by mouth daily. 30 tablet 11  . vitamin B-12 (CYANOCOBALAMIN) 1000 MCG tablet Take 1,000 mcg by mouth daily.    . vitamin C (ASCORBIC ACID) 500 MG tablet Take 1,000 mg by mouth daily.     Marland Kitchen warfarin (COUMADIN) 5 MG tablet TAKE AS DIRECTED BY COUMADIN CLINIC 105 tablet 1  . lisinopril (PRINIVIL,ZESTRIL) 5 MG tablet Take 1 tablet (5 mg total) by mouth daily. 90 tablet 3  . metoprolol succinate (TOPROL XL) 25 MG 24 hr tablet Take 1 tablet (25 mg total) by mouth daily. 90 tablet 3   No current facility-administered medications for this visit.     No Known Allergies  Social History   Socioeconomic History  . Marital status: Married    Spouse name: Not on file  . Number of children: 0  . Years of education: Not on file  . Highest education level: Not on file  Occupational History  . Not on file  Social Needs  . Financial resource strain: Not on file  . Food insecurity:    Worry: Not on file    Inability: Not on file  . Transportation needs:    Medical: Not on file    Non-medical: Not on file  Tobacco Use  . Smoking status: Never Smoker  . Smokeless tobacco: Never Used  Substance and Sexual Activity  . Alcohol use: Yes    Comment: socially  . Drug use: No  . Sexual activity: Not on file  Lifestyle  . Physical activity:    Days per week: Not on file    Minutes per session: Not on file  . Stress: Not on file  Relationships  . Social connections:    Talks on phone: Not on file    Gets together: Not on file    Attends religious service: Not on file    Active member of club or organization: Not on file    Attends meetings of  clubs or organizations: Not on file    Relationship status: Not on file  . Intimate partner violence:    Fear of current or ex partner: Not on file    Emotionally abused: Not on file    Physically abused: Not on file    Forced sexual activity: Not on file  Other Topics Concern  . 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 Heart attack Mother [id number]       brain impairment tobacco dm  . Diabetes Mother   . Hyperlipidemia Mother   . Heart disease Sister        whole in heart age 364  . Cancer Paternal Grandmother        breast  . Stroke Paternal Grandmother   . Heart failure Brother     Review of Systems:  As stated in the HPI and otherwise negative.   BP 130/64   Pulse 60   Ht 5\' 4"  (1.626 m)   Wt 156 lb (70.8 kg)   BMI 26.78 kg/m   Physical Examination:  General: Well developed, well nourished, NAD  HEENT: OP clear, mucus membranes moist  SKIN: warm, dry. No rashes. Neuro: No focal deficits  Musculoskeletal: Muscle strength 5/5 all ext  Psychiatric: Mood and affect normal  Neck: No JVD, no carotid bruits, no thyromegaly, no lymphadenopathy.  Lungs:Clear bilaterally, no wheezes, rhonci, crackles Cardiovascular: Regular rate and rhythm. Systolic murmur noted.  Abdomen:Soft. Bowel sounds present. Non-tender.  Extremities: No lower extremity edema. Pulses are 2 + in the bilateral DP/PT.  Echo June 2018: - Left ventricle: Septal hypokinesis. The cavity size was normal.   Wall thickness was increased in a pattern of mild LVH. Systolic   function was normal. The estimated ejection fraction was in the   range of 55% to 60%. Left ventricular diastolic function   parameters were normal. - Aortic valve: AV prosthesis is difficult to see. Peak and mean   gradients through the valve are 35  and 19 mm Hg. There is trivial   perivalvular AI. - Mitral valve: There was mild regurgitation. - Right ventricle: Systolic function was mildly reduced. - Right atrium: The atrium was mildly dilated.  EKG:  EKG is ordered today. The ekg ordered today demonstrates NSR, rate 60 bpm. Non-specific ST abnormality.   Recent Labs: 03/18/2017: ALT 30   Lipid Profile:  Lipid Panel     Component Value Date/Time   CHOL 171 03/18/2017 0817   TRIG 117 03/18/2017 0817   HDL 73 03/18/2017 0817   CHOLHDL 2.3 03/18/2017 0817   CHOLHDL 2 09/18/2009 1621   VLDL 21.8 09/18/2009 1621   LDLCALC 75 03/18/2017 0817   LDLDIRECT 180 (H) 12/16/2016 1510     Wt Readings from Last 3 Encounters:  10/26/17 156 lb (70.8 kg)  07/21/16 156 lb 12.8 oz (71.1 kg)  04/28/16 155 lb 1.9 oz (70.4 kg)     Other studies Reviewed: Additional studies/ records that were reviewed today include: . Review of the above records demonstrates:   Assessment and Plan:   1. Severe aortic valve insufficiency: She is s/p Bentall procedure with mechanical AVR in October 2017. She has done well following her procedure. Echo June 2018 with normally functioning mechanical AVR. Will continue coumadin.   2. Thoracic aortic aneurysm: She is s/p replacement of the aortic root and ascending aorta in October 2017. At that time, there was mild dilatation of the proximal descending thoracic aorta. Will repeat chest CTA now to evaluate. Continue beta blocker but will change to Toprol 25 mg per day (reducing dose due to fatigue and she cannot cut Lopressor 25 mg pills in half).   3. Hyperlipidemia: LDL 75 after starting Crestor. Will continue Crestor. Will need repeat lipids and LFTs in January 2020.  4. HTN: BP  is controlled. Will lower beta blocker dose due to fatigue (Change Lopressor 25 BID to Toprol 25 mg daily). Will increase Lisinopril to 5 mg daily.   Current medicines are reviewed at length with the patient today.  The patient does  not have concerns regarding medicines.  The following changes have been made:  no change  Labs/ tests ordered today include:   Orders Placed This Encounter  Procedures  . CT ANGIO CHEST AORTA W &/OR WO CONTRAST  . Basic Metabolic Panel (BMET)  . Lipid Profile  . Hepatic function panel  . EKG 12-Lead    Disposition:   FU with me in 12 months.     Signed, Verne Carrow, MD 10/26/2017 11:40 AM    [facility] [address] [place], [place], [place]  [address] Phone: [phone]; Fax: [phone]

## 2017-11-09 ENCOUNTER — Inpatient Hospital Stay: Admission: RE | Admit: 2017-11-09 | Payer: BLUE CROSS/BLUE SHIELD | Source: Ambulatory Visit

## 2017-11-18 DIAGNOSIS — Z6827 Body mass index (BMI) 27.0-27.9, adult: Secondary | ICD-10-CM | POA: Diagnosis not present

## 2017-11-18 DIAGNOSIS — Z1231 Encounter for screening mammogram for malignant neoplasm of breast: Secondary | ICD-10-CM | POA: Diagnosis not present

## 2017-11-18 DIAGNOSIS — E785 Hyperlipidemia, unspecified: Secondary | ICD-10-CM | POA: Diagnosis not present

## 2017-11-18 DIAGNOSIS — I279 Pulmonary heart disease, unspecified: Secondary | ICD-10-CM | POA: Diagnosis not present

## 2017-11-18 DIAGNOSIS — Z1212 Encounter for screening for malignant neoplasm of rectum: Secondary | ICD-10-CM | POA: Diagnosis not present

## 2017-11-18 DIAGNOSIS — Z01419 Encounter for gynecological examination (general) (routine) without abnormal findings: Secondary | ICD-10-CM | POA: Diagnosis not present

## 2017-11-23 ENCOUNTER — Ambulatory Visit (INDEPENDENT_AMBULATORY_CARE_PROVIDER_SITE_OTHER)
Admission: RE | Admit: 2017-11-23 | Discharge: 2017-11-23 | Disposition: A | Discharge status: Home / Self Care | Payer: BLUE CROSS/BLUE SHIELD | Source: Ambulatory Visit | Attending: Cardiovascular Disease | Admitting: Cardiovascular Disease

## 2017-11-23 DIAGNOSIS — I712 Thoracic aortic aneurysm, without rupture, unspecified: Secondary | ICD-10-CM

## 2017-11-23 MED ORDER — IOPAMIDOL (ISOVUE-370) INJECTION 76%
100.0000 mL | Freq: Once | INTRAVENOUS | Status: AC | PRN
  Administered 2017-11-23: 100 mL via INTRAVENOUS

## 2017-11-26 LAB — TSH: TSH: 0.86 (ref 0.41–5.90)

## 2017-11-26 LAB — BASIC METABOLIC PANEL
BUN: 16 (ref 4–21)
Creatinine: 0.9 (ref 0.5–1.1)
GLUCOSE: 90
POTASSIUM: 4 (ref 3.4–5.3)
Sodium: 139 (ref 137–147)

## 2017-11-26 LAB — LIPID PANEL
CHOLESTEROL: 188 (ref 0–200)
HDL: 90 — AB (ref 35–70)
LDL CALC: 79
Triglycerides: 105 (ref 40–160)

## 2017-11-26 LAB — HEPATIC FUNCTION PANEL
ALT: 37 — AB (ref 7–35)
AST: 41 — AB (ref 13–35)
Alkaline Phosphatase: 61 (ref 25–125)
Bilirubin, Direct: 0.5 — AB (ref 0.01–0.4)

## 2017-11-26 LAB — CBC AND DIFFERENTIAL
HEMATOCRIT: 37 (ref 36–46)
HEMOGLOBIN: 12.8 (ref 12.0–16.0)
PLATELETS: 214 (ref 150–399)
WBC: 4.8

## 2017-12-02 ENCOUNTER — Telehealth: Payer: Self-pay | Admitting: Cardiovascular Disease

## 2017-12-02 NOTE — Telephone Encounter (Signed)
New Message:    Patient is calling for CT Results

## 2017-12-02 NOTE — Telephone Encounter (Signed)
I spoke with pt and reviewed CT results with her.  

## 2017-12-07 ENCOUNTER — Ambulatory Visit (INDEPENDENT_AMBULATORY_CARE_PROVIDER_SITE_OTHER): Payer: BLUE CROSS/BLUE SHIELD

## 2017-12-07 DIAGNOSIS — Z5181 Encounter for therapeutic drug level monitoring: Secondary | ICD-10-CM | POA: Diagnosis not present

## 2017-12-07 DIAGNOSIS — Z952 Presence of prosthetic heart valve: Secondary | ICD-10-CM | POA: Diagnosis not present

## 2017-12-07 LAB — POCT INR: INR: 3.1 — AB (ref 2.0–3.0)

## 2017-12-07 NOTE — Patient Instructions (Signed)
Description   Take 1/2 tablet today, then resume same dosage 1 tablet (5mg ) everyday except 1.5 tablets (7.5mg ) on Mondays.  Recheck INR in 5 weeks.  Coumadin Clinic [phone]

## 2017-12-10 ENCOUNTER — Other Ambulatory Visit: Payer: Self-pay | Admitting: Cardiovascular Disease

## 2017-12-16 ENCOUNTER — Encounter: Payer: Self-pay | Admitting: Internal Medicine

## 2018-01-05 ENCOUNTER — Telehealth: Payer: Self-pay | Admitting: Pharmacist

## 2018-01-05 MED ORDER — ROSUVASTATIN CALCIUM 10 MG PO TABS: 10.0000 mg | ORAL_TABLET | Freq: Every day | ORAL | 11 refills | Status: DC

## 2018-01-05 NOTE — Telephone Encounter (Signed)
Pt called clinic to report some cramping in her feet for the past 2-3 months. Also states her OB wanted her to contact us about elevated liver enzymes. AST and ALT only <5 pts higher than normal range, advised pt this is ok and not concerning. Will try decreasing rosuvastatin to 10mg  daily though to see if this resolves cramping in feet. Will recheck lipids and LFTs in 2 months. If LDL rises above goal < 100, can add on Zetia.

## 2018-01-12 ENCOUNTER — Ambulatory Visit (INDEPENDENT_AMBULATORY_CARE_PROVIDER_SITE_OTHER): Payer: BLUE CROSS/BLUE SHIELD | Admitting: *Deleted

## 2018-01-12 DIAGNOSIS — Z952 Presence of prosthetic heart valve: Secondary | ICD-10-CM

## 2018-01-12 DIAGNOSIS — Z5181 Encounter for therapeutic drug level monitoring: Secondary | ICD-10-CM | POA: Diagnosis not present

## 2018-01-12 LAB — POCT INR: INR: 3.5 — AB (ref 2.0–3.0)

## 2018-01-12 NOTE — Patient Instructions (Signed)
Description   Do not take any Coumadin today then continue taking 1 tablet (5mg ) everyday except 1.5 tablets (7.5mg ) on Mondays.  Recheck INR in 4 weeks.  Cou[facility]in Clinic [phone]

## 2018-02-09 ENCOUNTER — Ambulatory Visit (INDEPENDENT_AMBULATORY_CARE_PROVIDER_SITE_OTHER): Payer: BLUE CROSS/BLUE SHIELD

## 2018-02-09 DIAGNOSIS — Z952 Presence of prosthetic heart valve: Secondary | ICD-10-CM

## 2018-02-09 DIAGNOSIS — Z5181 Encounter for therapeutic drug level monitoring: Secondary | ICD-10-CM | POA: Diagnosis not present

## 2018-02-09 LAB — POCT INR: INR: 3.2 — AB (ref 2.0–3.0)

## 2018-02-09 NOTE — Patient Instructions (Signed)
Description   Take 1/2 tablet today, then start taking 5mg  daily.  Recheck INR in 3-4 weeks.  Coumadin Clinic [phone]

## 2018-02-22 ENCOUNTER — Encounter: Payer: Self-pay | Admitting: Internal Medicine

## 2018-03-08 ENCOUNTER — Other Ambulatory Visit: Payer: BLUE CROSS/BLUE SHIELD | Admitting: *Deleted

## 2018-03-08 DIAGNOSIS — E782 Mixed hyperlipidemia: Secondary | ICD-10-CM | POA: Diagnosis not present

## 2018-03-08 LAB — LIPID PANEL
Chol/HDL Ratio: 2.6 ratio (ref 0.0–4.4)
Cholesterol, Total: 220 mg/dL — ABNORMAL HIGH (ref 100–199)
HDL: 86 mg/dL (ref >39)
LDL Calculated: 99 mg/dL (ref 0–99)
Triglycerides: 174 mg/dL — ABNORMAL HIGH (ref 0–149)
VLDL Cholesterol Cal: 35 mg/dL (ref 5–40)

## 2018-03-08 LAB — HEPATIC FUNCTION PANEL
ALT: 34 IU/L — AB (ref 0–32)
AST: 34 IU/L (ref 0–40)
Albumin: 4.9 g/dL (ref 3.5–5.5)
Alkaline Phosphatase: 78 IU/L (ref 39–117)
Bilirubin Total: 0.3 mg/dL (ref 0.0–1.2)
Bilirubin, Direct: 0.09 mg/dL (ref 0.00–0.40)
Total Protein: 6.7 g/dL (ref 6.0–8.5)

## 2018-03-11 ENCOUNTER — Telehealth: Payer: Self-pay | Admitting: *Deleted

## 2018-03-11 NOTE — Telephone Encounter (Signed)
Left message to call office

## 2018-03-11 NOTE — Telephone Encounter (Signed)
-----   Message from Kathleene Hazel, MD sent at 03/10/2018 11:15 AM EST ----- LDL is now 99, had been in the 70s but her Crestor was reduced to 10 mg daily from 20 mg daily due to cramps and mildly elevated LFTS. See phone note. I would favor continuing Crestor 10 mg daily now and adding Zetia 10 mg daily.   Thanks, Thayer Ohm

## 2018-03-16 NOTE — Telephone Encounter (Signed)
Left message to call back.  Pt will need fasting lipid and liver profiles 12 weeks after starting Zetia

## 2018-03-23 ENCOUNTER — Ambulatory Visit (INDEPENDENT_AMBULATORY_CARE_PROVIDER_SITE_OTHER): Payer: BLUE CROSS/BLUE SHIELD | Admitting: *Deleted

## 2018-03-23 DIAGNOSIS — Z5181 Encounter for therapeutic drug level monitoring: Secondary | ICD-10-CM

## 2018-03-23 DIAGNOSIS — Z952 Presence of prosthetic heart valve: Secondary | ICD-10-CM

## 2018-03-23 LAB — POCT INR: INR: 2 (ref 2.0–3.0)

## 2018-03-23 NOTE — Patient Instructions (Signed)
Description   Take 1/2 tablet today, then continue taking 5mg  daily.  Recheck INR in 6 weeks.  Coumadin Clinic [phone]

## 2018-03-25 ENCOUNTER — Other Ambulatory Visit: Payer: Self-pay | Admitting: Cardiovascular Disease

## 2018-04-09 NOTE — Telephone Encounter (Signed)
I placed call to pt and left message to call office.  

## 2018-04-14 ENCOUNTER — Encounter: Payer: Self-pay | Admitting: *Deleted

## 2018-04-14 NOTE — Telephone Encounter (Signed)
Letter sent to pt asking her to contact office to review results.

## 2018-04-20 ENCOUNTER — Telehealth: Payer: Self-pay | Admitting: Cardiovascular Disease

## 2018-04-20 DIAGNOSIS — E782 Mixed hyperlipidemia: Secondary | ICD-10-CM

## 2018-04-20 NOTE — Telephone Encounter (Signed)
Left message to call back  

## 2018-04-20 NOTE — Telephone Encounter (Signed)
Shelda Jakes RN sent out letter to pt on 04/14/18 to review lab results, pt calling back.

## 2018-04-20 NOTE — Telephone Encounter (Signed)
New Message:   Patient  Returning call concering results.

## 2018-04-21 ENCOUNTER — Other Ambulatory Visit: Payer: Self-pay | Admitting: Cardiovascular Disease

## 2018-04-26 ENCOUNTER — Telehealth: Payer: Self-pay | Admitting: Cardiovascular Disease

## 2018-04-26 MED ORDER — EZETIMIBE 10 MG PO TABS: 10.0000 mg | ORAL_TABLET | Freq: Every day | ORAL | 3 refills | Status: DC

## 2018-04-26 NOTE — Telephone Encounter (Signed)
New message   Patient c/o Palpitations:  High priority if patient c/o lightheadedness, shortness of breath, or chest pain  1) How long have you had palpitations/irregular HR/ Afib? Are you having the symptoms now? Irregular heartbeat started on 04/23/2018, patient has not noticed any symptoms so far today.  2) Are you currently experiencing lightheadedness, SOB or CP? dizziness  3) Do you have a history of afib (atrial fibrillation) or irregular heart rhythm?yes   4) Have you checked your BP or HR? (document readings if available):no   5) Are you experiencing any other symptoms?no

## 2018-04-26 NOTE — Telephone Encounter (Signed)
Follow up  ° ° °Patient is returning your call. °

## 2018-04-26 NOTE — Telephone Encounter (Signed)
I spoke with pt and reviewed 03/08/18 lab results and recommendations with her. She is willing to start Zetia. Will send prescription to [facility] on [address].  She will come in for fasting lipid and liver profile on 5/818/20

## 2018-04-26 NOTE — Telephone Encounter (Signed)
Left message to call office

## 2018-04-26 NOTE — Telephone Encounter (Signed)
I spoke with pt. She reports she started experiencing skipped heart beats on 04/23/18.  Does have some dizziness at times but does not feel like she is going to pass out.  Drinks 2 cups of coffee daily. Did have a recent sinus infection and was treated with antibiotic. Is not taking any decongestants.  Skipped heart beats were worse yesterday and seem to be a little better today.  She is requesting an office visit. I scheduled her to see B. Bhagat, PA on 04/28/18 at 9:00. I advised her to limit caffeine intake.  She is aware to go to ED if symptoms worsen prior to appointment.

## 2018-04-26 NOTE — Addendum Note (Signed)
Addended by: Dossie Arbour on: 04/26/2018 05:59 PM   Modules accepted: Orders

## 2018-04-27 ENCOUNTER — Emergency Department (HOSPITAL_COMMUNITY): Payer: BLUE CROSS/BLUE SHIELD

## 2018-04-27 ENCOUNTER — Encounter (HOSPITAL_COMMUNITY): Payer: Self-pay

## 2018-04-27 ENCOUNTER — Other Ambulatory Visit: Payer: Self-pay

## 2018-04-27 ENCOUNTER — Inpatient Hospital Stay (HOSPITAL_COMMUNITY): Payer: BLUE CROSS/BLUE SHIELD

## 2018-04-27 ENCOUNTER — Inpatient Hospital Stay (HOSPITAL_COMMUNITY)
Admission: EM | Admit: 2018-04-27 | Discharge: 2018-04-28 | DRG: 309 | Disposition: A | Discharge status: Home / Self Care | Payer: BLUE CROSS/BLUE SHIELD | Attending: Interventional Cardiology | Admitting: Interventional Cardiology

## 2018-04-27 DIAGNOSIS — R069 Unspecified abnormalities of breathing: Secondary | ICD-10-CM | POA: Diagnosis not present

## 2018-04-27 DIAGNOSIS — Z833 Family history of diabetes mellitus: Secondary | ICD-10-CM

## 2018-04-27 DIAGNOSIS — Z8349 Family history of other endocrine, nutritional and metabolic diseases: Secondary | ICD-10-CM | POA: Diagnosis not present

## 2018-04-27 DIAGNOSIS — I471 Supraventricular tachycardia, unspecified: Secondary | ICD-10-CM | POA: Diagnosis present

## 2018-04-27 DIAGNOSIS — Q211 Atrial septal defect: Secondary | ICD-10-CM | POA: Diagnosis not present

## 2018-04-27 DIAGNOSIS — Z952 Presence of prosthetic heart valve: Secondary | ICD-10-CM

## 2018-04-27 DIAGNOSIS — Z8249 Family history of ischemic heart disease and other diseases of the circulatory system: Secondary | ICD-10-CM

## 2018-04-27 DIAGNOSIS — I10 Essential (primary) hypertension: Secondary | ICD-10-CM | POA: Diagnosis present

## 2018-04-27 DIAGNOSIS — Z803 Family history of malignant neoplasm of breast: Secondary | ICD-10-CM | POA: Diagnosis not present

## 2018-04-27 DIAGNOSIS — I719 Aortic aneurysm of unspecified site, without rupture: Secondary | ICD-10-CM | POA: Diagnosis not present

## 2018-04-27 DIAGNOSIS — Z7982 Long term (current) use of aspirin: Secondary | ICD-10-CM

## 2018-04-27 DIAGNOSIS — Z7901 Long term (current) use of anticoagulants: Secondary | ICD-10-CM | POA: Diagnosis not present

## 2018-04-27 DIAGNOSIS — E782 Mixed hyperlipidemia: Secondary | ICD-10-CM | POA: Diagnosis not present

## 2018-04-27 DIAGNOSIS — R079 Chest pain, unspecified: Secondary | ICD-10-CM | POA: Diagnosis not present

## 2018-04-27 DIAGNOSIS — I447 Left bundle-branch block, unspecified: Secondary | ICD-10-CM | POA: Diagnosis not present

## 2018-04-27 DIAGNOSIS — G43909 Migraine, unspecified, not intractable, without status migrainosus: Secondary | ICD-10-CM | POA: Diagnosis not present

## 2018-04-27 DIAGNOSIS — Z823 Family history of stroke: Secondary | ICD-10-CM | POA: Diagnosis not present

## 2018-04-27 LAB — I-STAT TROPONIN, ED: Troponin i, poc: 0 ng/mL (ref 0.00–0.08)

## 2018-04-27 LAB — ECHOCARDIOGRAM COMPLETE

## 2018-04-27 LAB — BASIC METABOLIC PANEL
Anion gap: 8 (ref 5–15)
BUN: 18 mg/dL (ref 6–20)
CHLORIDE: 106 mmol/L (ref 98–111)
CO2: 24 mmol/L (ref 22–32)
Calcium: 9.9 mg/dL (ref 8.9–10.3)
Creatinine, Ser: 0.96 mg/dL (ref 0.44–1.00)
GFR calc Af Amer: >60 mL/min (ref >60)
GFR calc non Af Amer: >60 mL/min (ref >60)
Glucose, Bld: 93 mg/dL (ref 70–99)
Potassium: 4.2 mmol/L (ref 3.5–5.1)
Sodium: 138 mmol/L (ref 135–145)

## 2018-04-27 LAB — CBC
HEMATOCRIT: 41.4 % (ref 36.0–46.0)
Hemoglobin: 12.9 g/dL (ref 12.0–15.0)
MCH: 29.4 pg (ref 26.0–34.0)
MCHC: 31.2 g/dL (ref 30.0–36.0)
MCV: 94.3 fL (ref 80.0–100.0)
Platelets: 224 10*3/uL (ref 150–400)
RBC: 4.39 MIL/uL (ref 3.87–5.11)
RDW: 12.5 % (ref 11.5–15.5)
WBC: 5.2 10*3/uL (ref 4.0–10.5)
nRBC: 0 % (ref 0.0–0.2)

## 2018-04-27 LAB — PROTIME-INR
INR: 1.9 — ABNORMAL HIGH (ref 0.8–1.2)
PROTHROMBIN TIME: 21.3 s — AB (ref 11.4–15.2)

## 2018-04-27 LAB — TSH: TSH: 1.303 u[IU]/mL (ref 0.350–4.500)

## 2018-04-27 MED ORDER — ONDANSETRON HCL 4 MG/2ML IJ SOLN: 4.0000 mg | Freq: Four times a day (QID) | INTRAMUSCULAR | Status: DC | PRN

## 2018-04-27 MED ORDER — LISINOPRIL 5 MG PO TABS: 5.0000 mg | ORAL_TABLET | Freq: Every day | ORAL | Status: DC

## 2018-04-27 MED ORDER — ROSUVASTATIN CALCIUM 5 MG PO TABS
10.0000 mg | ORAL_TABLET | Freq: Every day | ORAL | Status: DC
  Administered 2018-04-27 – 2018-04-28 (×2): 10 mg via ORAL
  Filled 2018-04-27 (×4): qty 2

## 2018-04-27 MED ORDER — ONDANSETRON HCL 4 MG PO TABS: 4.0000 mg | ORAL_TABLET | Freq: Four times a day (QID) | ORAL | Status: DC | PRN

## 2018-04-27 MED ORDER — METOPROLOL SUCCINATE ER 25 MG PO TB24: 37.5000 mg | ORAL_TABLET | Freq: Every day | ORAL | Status: DC

## 2018-04-27 MED ORDER — FLECAINIDE ACETATE 50 MG PO TABS
50.0000 mg | ORAL_TABLET | Freq: Two times a day (BID) | ORAL | Status: DC
  Administered 2018-04-27 – 2018-04-28 (×2): 50 mg via ORAL
  Filled 2018-04-27 (×2): qty 1

## 2018-04-27 MED ORDER — WARFARIN - PHARMACIST DOSING INPATIENT: Freq: Every day | Status: DC

## 2018-04-27 MED ORDER — WARFARIN SODIUM 7.5 MG PO TABS
7.5000 mg | ORAL_TABLET | Freq: Once | ORAL | Status: AC
  Administered 2018-04-27: 7.5 mg via ORAL
  Filled 2018-04-27 (×2): qty 1

## 2018-04-27 MED ORDER — ACETAMINOPHEN 325 MG PO TABS
650.0000 mg | ORAL_TABLET | Freq: Four times a day (QID) | ORAL | Status: DC | PRN
  Administered 2018-04-27: 650 mg via ORAL
  Filled 2018-04-27: qty 2

## 2018-04-27 MED ORDER — ACETAMINOPHEN 650 MG RE SUPP: 650.0000 mg | Freq: Four times a day (QID) | RECTAL | Status: DC | PRN

## 2018-04-27 MED ORDER — SODIUM CHLORIDE 0.9% FLUSH
3.0000 mL | Freq: Once | INTRAVENOUS | Status: AC
  Administered 2018-04-27: 3 mL via INTRAVENOUS

## 2018-04-27 MED ORDER — METOPROLOL SUCCINATE ER 25 MG PO TB24
12.5000 mg | ORAL_TABLET | Freq: Once | ORAL | Status: AC
  Administered 2018-04-27: 12.5 mg via ORAL
  Filled 2018-04-27: qty 1

## 2018-04-27 MED ORDER — METOPROLOL TARTRATE 25 MG PO TABS
25.0000 mg | ORAL_TABLET | Freq: Two times a day (BID) | ORAL | Status: DC
  Administered 2018-04-27 – 2018-04-28 (×2): 25 mg via ORAL
  Filled 2018-04-27 (×2): qty 1

## 2018-04-27 MED ORDER — POLYETHYLENE GLYCOL 3350 17 G PO PACK: 17.0000 g | PACK | Freq: Every day | ORAL | Status: DC | PRN

## 2018-04-27 MED ORDER — ASPIRIN EC 81 MG PO TBEC: 81.0000 mg | DELAYED_RELEASE_TABLET | Freq: Every day | ORAL | Status: DC

## 2018-04-27 MED ORDER — EZETIMIBE 10 MG PO TABS
10.0000 mg | ORAL_TABLET | Freq: Every day | ORAL | Status: DC
  Administered 2018-04-27: 10 mg via ORAL
  Filled 2018-04-27: qty 1

## 2018-04-27 NOTE — ED Notes (Signed)
Patient transported to get ECHO

## 2018-04-27 NOTE — Progress Notes (Signed)
2D Echocardiogram has been performed. 04/27/2018, 2:55 PM

## 2018-04-27 NOTE — Care Plan (Signed)
Incessant runs of symptomatic AVNRT ongoing. Only on minimal beta blockade. Plenty of blood pressure to more rapidly uptitrate. History of fatigue at higher doses, but could bridge to other antiarrhythmic or preferably ablation for long-term management.  Changed to metoprolol tartrate 25mg  BID, may increase to q6h later tonight if needed. Will hold lisinopril 5mg  for now to allow for further escalation.

## 2018-04-27 NOTE — Progress Notes (Signed)
Pt has low BP but still high HR. MD notified. MD at bedside. Orders to still give metoprolol. Will continue to monitor.

## 2018-04-27 NOTE — Progress Notes (Signed)
Echocardiogram 2D Echocardiogram has been performed. 04/27/2018, 3:01 PM

## 2018-04-27 NOTE — Consult Note (Signed)
ELECTROPHYSIOLOGY CONSULT NOTE    Primary Care Physician: Madelin HeadingsPanosh, Wanda K, MD Referring Physician:  Dr Eldridge DaceVaranasi  Admit Date: 04/27/2018  Reason for consultation:  SVT  Marissa SpiceDonna Bailey is a 58 y.o. female with a h/o prior AVR (mechanical) with bentall procedure who now is admitted with symptomatic SVT.  She has chronically been noted to have PACs for which she has been symptomatic.  She reports palpitations and fatigue.  She also reports discomfort in her chest and back during SVT.  Today, she denies symptoms of shortness of breath, orthopnea, PND, lower extremity edema,  presyncope, syncope, or neurologic sequela. The patient is tolerating medications without difficulties and is otherwise without complaint today.   Past Medical History:  Diagnosis Date  . Abnormal TSH   . Anemia   . Anxiety   . Aortic valve insufficiency    a. s/p Bentall with mechanical aortic valve replacement, with replacement of the ascending aorta and proximal aortic arch 12/2015.  Marland Kitchen. Hyperlipidemia, mixed   . Migraines    menstrual  . Patent foramen ovale   . Premature atrial contractions   . PSVT (paroxysmal supraventricular tachycardia) (HCC)    a. 48 hour monitor in 2013 with NSR, PACs and several short runs of SVT (15 beats).  . Seasonal allergies   . Thoracic aortic aneurysm (HCC)    a. s/p Bentall 2017.   Past Surgical History:  Procedure Laterality Date  . BENTALL PROCEDURE N/A 12/03/2015   Procedure: BENTALL PROCEDURE USING 21MM ST. JUDE AORTIC VALVE CONDUIT;  Surgeon: Alleen BorneBryan K Bartle, MD;  Location: [facility] OR;  Service: Open Heart Surgery;  Laterality: N/A;  CIRC ARREST  . CARDIAC CATHETERIZATION N/A 11/15/2015   Procedure: Right/Left Heart Cath and Coronary Angiography;  Surgeon: Kathleene Hazelhristopher D McAlhany, MD;  [facility];  Service: Cardiovascular;  Laterality: N/A;  . DILATION AND CURETTAGE OF UTERUS    . EXPLORATORY LAPAROTOMY     for tubal pregnancy  . EYE SURGERY Bilateral    lasik  .  REPLACEMENT ASCENDING AORTA N/A 12/03/2015   Procedure: REPLACEMENT ASCENDING AORTA USING 28MM HEMASHIELD GRAFT;  Surgeon: Alleen BorneBryan K Bartle, MD;  Location: [facility] OR;  Service: Open Heart Surgery;  Laterality: N/A;  . TEE WITHOUT CARDIOVERSION N/A 11/07/2015   Procedure: TRANSESOPHAGEAL ECHOCARDIOGRAM (TEE);  Surgeon: Chrystie NoseKenneth C Hilty, MD;  [facility];  Service: Cardiovascular;  Laterality: N/A;  . TEE WITHOUT CARDIOVERSION N/A 12/03/2015   Procedure: TRANSESOPHAGEAL ECHOCARDIOGRAM (TEE);  Surgeon: Alleen BorneBryan K Bartle, MD;  [facility];  Service: Open Heart Surgery;  Laterality: N/A;  . vein surgery lower extremity      . [START ON 04/28/2018] aspirin EC  81 mg Oral QHS  . ezetimibe  10 mg Oral Daily  . flecainide  50 mg Oral Q12H  . metoprolol tartrate  25 mg Oral BID  . rosuvastatin  10 mg Oral Daily  . Warfarin - Pharmacist Dosing Inpatient   Does not apply q1800     No Known Allergies  Social History   Socioeconomic History  . Marital status: Married    Spouse name: Not on file  . Number of children: 0  . Years of education: Not on file  . Highest education level: Not on file  Occupational History  . Not on file  Social Needs  . Financial resource strain: Not on file  . Food insecurity:    Worry: Not on file    Inability: Not on file  . Transportation needs:  Medical: Not on file    Non-medical: Not on file  Tobacco Use  . Smoking status: Never Smoker  . Smokeless tobacco: Never Used  Substance and Sexual Activity  . Alcohol use: Yes    Comment: socially  . Drug use: No  . Sexual activity: Not on file  Lifestyle  . Physical activity:    Days per week: Not on file    Minutes per session: Not on file  . Stress: Not on file  Relationships  . Social connections:    Talks on phone: Not on file    Gets together: Not on file    Attends religious service: Not on file    Active member of club or organization: Not on file    Attends meetings of clubs or  organizations: Not on file    Relationship status: Not on file  . Intimate partner violence:    Fear of current or ex partner: Not on file    Emotionally abused: Not on file    Physically abused: Not on file    Forced sexual activity: Not on file  Other Topics Concern  . 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 Heart attack Mother 34       brain impairment tobacco dm  . Diabetes Mother   . Hyperlipidemia Mother   . Heart disease Sister        whole in heart age 74  . Cancer Paternal Grandmother        breast  . Stroke Paternal Grandmother   . Heart failure Brother     ROS- All systems are reviewed and negative except as per the HPI above  Physical Exam: Telemetry:  Sinus with runs of SVT Vitals:   04/27/18 1245 04/27/18 1330 04/27/18 1553 04/27/18 2040  BP: (!) 131/59 127/66 (!) 147/116 90/67  Pulse: 62 75 (!) 132 (!) 174  Resp: (!) 22 (!) 23  18  Temp:   97.7 F (36.5 C) 98.3 F (36.8 C)  TempSrc:   Oral Oral  SpO2: 99% 99% 98% 97%    GEN- The patient is well appearing, alert and oriented x 3 today.   Head- normocephalic, atraumatic Eyes-  Sclera clear, conjunctiva pink Ears- hearing intact Oropharynx- clear Neck- supple, no JVP Lymph- no cervical lymphadenopathy Lungs- Clear to ausculation bilaterally, normal work of breathing Heart- Regular rate and rhythm, mechanical S2 GI- soft, NT, ND, + BS Extremities- no clubbing, cyanosis, or edema MS- no significant deformity or atrophy Skin- no rash or lesion Psych- euthymic mood, full affect Neuro- strength and sensation are intact  EKG- sinus with nonsustained SVT  Labs:   Lab Results  Component Value Date   WBC 5.2 04/27/2018   HGB 12.9 04/27/2018   HCT 41.4 04/27/2018   MCV 94.3 04/27/2018   PLT 224 04/27/2018     Recent Labs  Lab 04/27/18 1007  NA 138  K 4.2  CL 106  CO2 24  BUN 18  CREATININE 0.96  CALCIUM 9.9  GLUCOSE 93   Lab Results  Component Value Date   TROPONINI <0.30 11/04/2011    Lab Results  Component Value Date   CHOL 220 (H) 03/08/2018   CHOL 188 11/26/2017  CHOL 171 03/18/2017   Lab Results  Component Value Date   HDL 86 03/08/2018   HDL 90 (A) 11/26/2017   HDL 73 03/18/2017   Lab Results  Component Value Date   LDLCALC 99 03/08/2018   LDLCALC 79 11/26/2017   LDLCALC 75 03/18/2017   Lab Results  Component Value Date   TRIG 174 (H) 03/08/2018   TRIG 105 11/26/2017   TRIG 117 03/18/2017   Lab Results  Component Value Date   CHOLHDL 2.6 03/08/2018   CHOLHDL 2.3 03/18/2017   CHOLHDL 4.0 12/16/2016   Lab Results  Component Value Date   LDLDIRECT 180 (H) 12/16/2016     Echo:  Reviewed Prior cath reveals no CAD  ASSESSMENT AND PLAN:   1. SVT She has recurrent and frequent SVT.  This is a mid RP SVT.  She is also symptomatic with PACs.  I will start flecainide 50mg  BID at this time.  This can be increased further if needed. We may eventually consider ablation, though I would prefer a more conservative approach initially.   Hillis Range, MD 04/27/2018  9:53 PM

## 2018-04-27 NOTE — Progress Notes (Signed)
Pt has be sustaining SvT. MD notified. MD at bedside recommending dose of metoprolol. Will continue to monitor.

## 2018-04-27 NOTE — ED Notes (Signed)
Pt ambulatory to restroom with a steady gait.  Pt returned to room, bedside monitors replaced.

## 2018-04-27 NOTE — Consult Note (Addendum)
CARDIOLOGY H&P    Patient ID: Marissa Bailey; [id number]; [birth year]   Admit date: 04/27/2018 Date of Consult: 04/27/2018  Primary Care Provider: Madelin Headings, MD Primary Cardiologist: Dr. Clifton James  Primary Electrophysiologist:  None   Patient Profile:   Marissa Bailey is a 58 y.o. female with a hx of AR s/p AVR in 2017 and PAC/SVT who is being seen today for the evaluation of PACs.  History of Present Illness:   Marissa Bailey is a 58 year old female with a history of severe aortic valve insufficiency s/p Bentall procedure and SVT/PACs who presented to the emergency department with complaints of heart palpitations. She states that on Friday she noticed intermittent episodes of tachycardia and feeling of skipped beats. She states that with her mechanical valve she is very sensitive to her PACs. She has had associated dizziness and a frontal headache but denies any shortness of breath, syncope, pre-syncope. She did have a transient episode of chest pain on Sunday night when she lie down to go to sleep but states that it lasted less than 10 seconds. She has not had any exertional symptoms. She last saw Dr. Clifton James in August 2019 and at that point was complaining of generalized fatigue so her metoprolol tartrate was switch to metoprolol succinate at half the dosage. Since that time she has not noticed more frequent PACs. She has had episodes similar to this but they have not been this severe. She has not been sick recently. She has not had any other medication changes. She has not been using excessive over-the-counter medications like decongestants. She has not changed her caffeine consumption. Through chart review it does appear that she had a brief episode of paroxysmal atrial fibrillation after her aortic valve replacement in 2017. Denies any orthopnea, PND, lower extremity edema.  Past Medical History:  Diagnosis Date  . Abnormal TSH   . Anemia   . Anxiety   . Aortic valve insufficiency    a.  s/p Bentall with mechanical aortic valve replacement, with replacement of the ascending aorta and proximal aortic arch 12/2015.  Marland Kitchen Hyperlipidemia, mixed   . Migraines    menstrual  . Patent foramen ovale   . Premature atrial contractions   . PSVT (paroxysmal supraventricular tachycardia) (HCC)    a. 48 hour monitor in 2013 with NSR, PACs and several short runs of SVT (15 beats).  . Seasonal allergies   . Thoracic aortic aneurysm (HCC)    a. s/p Bentall 2017.   Past Surgical History:  Procedure Laterality Date  . BENTALL PROCEDURE N/A 12/03/2015   Procedure: BENTALL PROCEDURE USING [facility] AORTIC VALVE CONDUIT;  Surgeon: Alleen Borne, MD;  Location: [facility] OR;  Service: Open Heart Surgery;  Laterality: N/A;  CIRC ARREST  . CARDIAC CATHETERIZATION N/A 11/15/2015   Procedure: Right/Left Heart Cath and Coronary Angiography;  Surgeon: Kathleene Hazel, MD;  Location: [facility]VASIVE CV LAB;  Service: Cardiovascular;  Laterality: N/A;  . DILATION AND CURETTAGE OF UTERUS    . EXPLORATORY LAPAROTOMY     for tubal pregnancy  . EYE SURGERY Bilateral    lasik  . REPLACEMENT ASCENDING AORTA N/A 12/03/2015   Procedure: REPLACEMENT ASCENDING AORTA USING HEMASHIELD GRAFT;  Surgeon: Alleen Borne, MD;  Location: [facility] OR;  Service: Open Heart Surgery;  Laterality: N/A;  . TEE WITHOUT CARDIOVERSION N/A 11/07/2015   Procedure: TRANSESOPHAGEAL ECHOCARDIOGRAM (TEE);  Surgeon: Chrystie Nose, MD;  Location: [facility] ENDOSCOPY;  Service: Cardiovascular;  Laterality: N/A;  .  TEE WITHOUT CARDIOVERSION N/A 12/03/2015   Procedure: TRANSESOPHAGEAL ECHOCARDIOGRAM (TEE);  Surgeon: Alleen Borne, MD;  Location: [facility] OR;  Service: Open Heart Surgery;  Laterality: N/A;  . vein surgery lower extremity      Home Medications:  Prior to Admission medications   Medication Sig Start Date End Date Taking? Authorizing Provider  warfarin (COUMADIN) 5 MG tablet TAKE AS DIRECTED BY COUMADIN CLINIC Patient taking differently:  Take 5 mg by mouth daily.  04/21/18  Yes Kathleene Hazel, MD  acetaminophen (TYLENOL) 325 MG tablet Take 2 tablets (650 mg total) by mouth every 6 (six) hours as needed for mild pain. 12/09/15   Sharlene Dory, PA-C  amoxicillin (AMOXIL) 500 MG tablet Take 4 tablets 30-60 minutes prior to dental appointment. 06/12/16   Kathleene Hazel, MD  aspirin 81 MG tablet Take 81 mg by mouth at bedtime.     [provider]  Cholecalciferol (VITAMIN D) 1000 UNITS capsule Take 2,000 Units by mouth daily.     [provider]  ezetimibe (ZETIA) 10 MG tablet Take 1 tablet (10 mg total) by mouth daily. 04/26/18   Kathleene Hazel, MD  fish oil-omega-3 fatty acids 1000 MG capsule Take 2 g by mouth daily.      [provider]  glucosamine-chondroitin 500-400 MG tablet Take 1 tablet by mouth daily.    [provider]  lisinopril (PRINIVIL,ZESTRIL) 5 MG tablet Take 1 tablet (5 mg total) by mouth daily. 10/26/17   Kathleene Hazel, MD  MAGNESIUM CARBONATE PO Take 1 tablet by mouth daily.     [provider]  metoprolol succinate (TOPROL XL) 25 MG 24 hr tablet Take 1 tablet (25 mg total) by mouth daily. 10/26/17   Kathleene Hazel, MD  Multiple Vitamin (MULTIVITAMIN WITH MINERALS) TABS Take 1 tablet by mouth daily.    [provider]  NON FORMULARY Apply 0.5 mLs topically daily. 70/30 mix of estriol and estradiol in a cream base. Compounded by [facility].    [provider]  PRESCRIPTION MEDICATION Apply 0.1 mLs topically daily. Testosterone 2% cream compounded    [provider]  progesterone (PROMETRIUM) 100 MG capsule Take 100 mg by mouth at bedtime.    [provider]  rosuvastatin (CRESTOR) 10 MG tablet Take 1 tablet (10 mg total) by mouth daily. 01/05/18 01/05/19  Kathleene Hazel, MD  vitamin B-12 (CYANOCOBALAMIN) 1000 MCG tablet Take 1,000 mcg by mouth daily.    [provider]    vitamin C (ASCORBIC ACID) 500 MG tablet Take 1,000 mg by mouth daily.     [provider]   Inpatient Medications: Scheduled Meds:  Continuous Infusions:  PRN Meds:  Allergies:   No Known Allergies  Social History:   Social History   Socioeconomic History  . Marital status: Married    Spouse name: Not on file  . Number of children: 0  . Years of education: Not on file  . Highest education level: Not on file  Occupational History  . Not on file  Social Needs  . Financial resource strain: Not on file  . Food insecurity:    Worry: Not on file    Inability: Not on file  . Transportation needs:    Medical: Not on file    Non-medical: Not on file  Tobacco Use  . Smoking status: Never Smoker  . Smokeless tobacco: Never Used  Substance and Sexual Activity  . Alcohol use: Yes  Comment: socially  . Drug use: No  . Sexual activity: Not on file  Lifestyle  . Physical activity:    Days per week: Not on file    Minutes per session: Not on file  . Stress: Not on file  Relationships  . Social connections:    Talks on phone: Not on file    Gets together: Not on file    Attends religious service: Not on file    Active member of club or organization: Not on file    Attends meetings of clubs or organizations: Not on file    Relationship status: Not on file  . Intimate partner violence:    Fear of current or ex partner: Not on file    Emotionally abused: Not on file    Physically abused: Not on file    Forced sexual activity: Not on file  Other Topics Concern  . 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 Heart attack Mother 53       brain impairment tobacco dm  . Diabetes Mother   . Hyperlipidemia Mother   . Heart disease Sister        whole in  heart age 47  . Cancer Paternal Grandmother        breast  . Stroke Paternal Grandmother   . Heart failure Brother     ROS:  Performed and all others negative.    Physical Exam/Data:   Vitals:   04/27/18 1000 04/27/18 1045  BP: (!) 160/76 127/64  Pulse: 77 69  Resp: 16 15  Temp: (!) 97.4 F (36.3 C)   TempSrc: Oral   SpO2: 99% 99%   No intake or output data in the 24 hours ending 04/27/18 1152 There were no vitals filed for this visit. There is no height or weight on file to calculate BMI.   General:  Well nourished, well developed, in no acute distress HEENT: Normal Lymph: No adenopathy Neck: No JVD Endocrine:  No thryomegaly Vascular: No carotid bruits; FA pulses 2+ bilaterally without bruits  Cardiac:  Irregular rhythm, regular rate, systolic ejection click with radiation to the back, sternotomy scar Lungs: Clear to auscultation bilaterally, no wheezing, rhonchi or rales  Abd: Soft, nontender, no hepatomegaly  Ext: No edema Musculoskeletal:  No deformities, BUE and BLE strength normal and equal Skin: Warm and dry  Neuro:  CNs 2-12 intact, no focal abnormalities noted Psych:  Normal affect   EKG:  The EKG was personally reviewed and demonstrates: NSR with PACs. Other EKG with SVT.   Relevant CV Studies:  TTE 08/14/2016  - Left ventricle: Septal hypokinesis. The cavity size was normal.   Wall thickness was increased in a pattern of mild LVH. Systolic   function was normal. The estimated ejection fraction was in the   range of 55% to 60%. Left ventricular diastolic function   parameters were normal. - Aortic valve: AV prosthesis is difficult to see. Peak and mean   gradients through the valve are 35 and 19 mm Hg. There is trivial   perivalvular AI. - Mitral valve: There was mild regurgitation. - Right ventricle: Systolic function was mildly reduced. - Right atrium: The atrium was mildly dilated.  Laboratory Data:  Chemistry Recent Labs  Lab 04/27/18 1007   NA 138  K 4.2  CL 106  CO2 24  GLUCOSE 93  BUN 18  CREATININE 0.96  CALCIUM 9.9  GFRNONAA >60  GFRAA >60  ANIONGAP 8    No results for input(s): PROT, ALBUMIN, AST, ALT, ALKPHOS, BILITOT in the last 168 hours.   Hematology Recent Labs  Lab 04/27/18 1007  WBC 5.2  RBC 4.39  HGB 12.9  HCT 41.4  MCV 94.3  MCH 29.4  MCHC 31.2  RDW 12.5  PLT 224   Cardiac EnzymesNo results for input(s): TROPONINI in the last 168 hours.  Recent Labs  Lab 04/27/18 1014  TROPIPOC 0.00    BNPNo results for input(s): BNP, PROBNP in the last 168 hours.   DDimer No results for input(s): DDIMER in the last 168 hours.  Radiology/Studies:  Dg Chest 2 View  Result Date: 04/27/2018 CLINICAL DATA:  Chest pain and cardiac palpitations EXAM: CHEST - 2 VIEW COMPARISON:  Chest radiograph January 09, 2016 and chest CT November 23, 2017 FINDINGS: There is no appreciable edema or consolidation. Heart size and pulmonary vascularity are normal. No adenopathy. Patient is status post aortic valve replacement. There is aortic atherosclerosis. No bone lesions. No pneumothorax. IMPRESSION: No edema or consolidation. Status post aortic valve replacement. Aortic Atherosclerosis (ICD10-I70.0). Electronically Signed   By: Bretta Bang III M.D.   On: 04/27/2018 10:52   Assessment and Plan:   Marissa Bailey is a 58 y.o. female with a hx of AR s/p AVR in 2017 and PAC/SVT who is being seen today for the evaluation of PACs.  SVT/PACs - EKGs reviewed appears to be PACs and SVT  - On metoprolol succinate 25 mg QD. Could increase to 37.5 mg QD to help control PAC burden  - Would recommend repeat echo to ensure no structural changes.  - We will admit her and consult EP for further evaluation  AR s/p AVR - S/p Bentall procedure with mechanical AVR in October 2017. - Will continue coumadin.  HLD  - On Rosuvastatin  - Recently started Zetia but has not started it yet.  HTN - Goal <130/80  - Room to increase  metoprolol succinate to 37.5 mg QD  - Continue lisinopril 5 mg QD   Proximal Descending Aortic Aneurysm  - Stable on last CTA in 11/2017, 3.6 cm  - Continue BP and HLD control  - Continue annual follow-up   Will discuss the case further with Dr. Eldridge Dace.   For questions or updates, please contact [facility] Please consult [email or web address] for contact info under Cardiology/STEMI.   Signed, Levora Dredge, MD 04/27/2018 11:52 AM   I have examined the patient and reviewed assessment and plan and discussed with patient.  Agree with above as stated.   Frequent PACs with symptoms.  In the past, she had fatigue with higher doses of beta blocker.    Runs of SVT noted on ECG which also go along with her sx.  WIll have EP look at the patient for possible anti-arrhtyhmic therapy vs. Ablation.    Lance Muss

## 2018-04-27 NOTE — ED Triage Notes (Signed)
Pt presents for evaluation of irregular heart beat and palpitations with dizziness and generalized weakness x 3-4 days. Pt reports has mechanical heart valve, on coumadin. Pt states no hx of afib dx.

## 2018-04-27 NOTE — Progress Notes (Signed)
ANTICOAGULATION CONSULT NOTE  Pharmacy Consult for warfarin Indication: mechanical AVR   Assessment: 57 yof on warfarin PTA for hx of mechanical AVR presenting for evaluation of PACs. Pharmacy consulted to dose warfarin. INR slightly subtherapeutic at 1.9 on admit. CBC wnl. No active bleed issues documented.  PTA warfarin dose: 5mg  daily per last outpatient anticoag clinic note (last dose 04/26/18 PTA)  Goal of Therapy:  INR 2-3 Monitor platelets by anticoagulation protocol: Yes   Plan:  Warfarin 7.5mg  PO x 1 dose tonight - likely resume home dose tomorrow Daily INR Monitor CBC, s/sx bleeding  Babs Bertin, PharmD, BCPS Clinical Pharmacist 04/27/2018 12:40 PM

## 2018-04-27 NOTE — ED Provider Notes (Signed)
[facility] EMERGENCY DEPARTMENT Provider Note   CSN: [id number] Arrival date & time: 04/27/18  4098    History   Chief Complaint Chief Complaint  Patient presents with  . Palpitations  . Dizziness    HPI Marissa Bailey is a 58 y.o. female.     Patient is a 58 year old female who presents with palpitations.  She has a 3 to 4-day history of increased palpitations associated with generalized weakness and intermittent dizziness.  She had one episode of chest pain 2 days ago lasting about 10 seconds but has had no further episodes of chest pain.  She does have a history of a mechanical heart valve and is on Coumadin.  She has had a history of SVT and PVCs in the past.  She is on metoprolol but her dose was decreased from twice a day to once a day in August of last year.  She denies any recent illnesses other than a cold back in January.  No vomiting or diarrhea.  She denies any excessive caffeine intake.  She denies any over-the-counter or herbal supplements.     Past Medical History:  Diagnosis Date  . Abnormal TSH   . Anemia   . Anxiety   . Aortic valve insufficiency    a. s/p Bentall with mechanical aortic valve replacement, with replacement of the ascending aorta and proximal aortic arch 12/2015.  Marland Kitchen Hyperlipidemia, mixed   . Migraines    menstrual  . Patent foramen ovale   . Premature atrial contractions   . PSVT (paroxysmal supraventricular tachycardia) (HCC)    a. 48 hour monitor in 2013 with NSR, PACs and several short runs of SVT (15 beats).  . Seasonal allergies   . Thoracic aortic aneurysm (HCC)    a. s/p Bentall 2017.    Patient Active Problem List   Diagnosis Date Noted  . SVT (supraventricular tachycardia) (HCC) 04/27/2018  . Hyperlipidemia 12/16/2016  . HTN (hypertension) 04/28/2016  . Migraines 04/28/2016  . Dizziness 03/31/2016  . Encounter for therapeutic drug monitoring 12/10/2015  . S/P AVR 12/06/2015  . Hx of ascending aorta  replacement 12/06/2015  . Severe aortic insufficiency 12/03/2015  . Aortic insufficiency   . Fam hx-ischem heart disease 08/31/2010  . Eustachian tube dysfunction 08/31/2010  . Wax in ear 08/31/2010  . Decreased hearing 08/31/2010  . ANEMIA, MILD 10/19/2009  . SEDIMENTATION RATE, ELEVATED 10/19/2009  . VITAMIN D DEFICIENCY 09/18/2009  . BACK PAIN, THORACIC REGION 09/18/2009  . PALPITATIONS 09/18/2009    Past Surgical History:  Procedure Laterality Date  . BENTALL PROCEDURE N/A 12/03/2015   Procedure: BENTALL PROCEDURE USING [facility] AORTIC VALVE CONDUIT;  Surgeon: Alleen Borne, MD;  Location: [facility] OR;  Service: Open Heart Surgery;  Laterality: N/A;  CIRC ARREST  . CARDIAC CATHETERIZATION N/A 11/15/2015   Procedure: Right/Left Heart Cath and Coronary Angiography;  Surgeon: Kathleene Hazel, MD;  Location: [facility] INVASIVE CV LAB;  Service: Cardiovascular;  Laterality: N/A;  . DILATION AND CURETTAGE OF UTERUS    . EXPLORATORY LAPAROTOMY     for tubal pregnancy  . EYE SURGERY Bilateral    lasik  . REPLACEMENT ASCENDING AORTA N/A 12/03/2015   Procedure: REPLACEMENT ASCENDING AORTA USING HEMASHIELD GRAFT;  Surgeon: Alleen Borne, MD;  Location: [facility] OR;  Service: Open Heart Surgery;  Laterality: N/A;  . TEE WITHOUT CARDIOVERSION N/A 11/07/2015   Procedure: TRANSESOPHAGEAL ECHOCARDIOGRAM (TEE);  Surgeon: Chrystie Nose, MD;  Location: [facility] ENDOSCOPY;  Service:  Cardiovascular;  Laterality: N/A;  . TEE WITHOUT CARDIOVERSION N/A 12/03/2015   Procedure: TRANSESOPHAGEAL ECHOCARDIOGRAM (TEE);  Surgeon: Alleen BorneBryan K Bartle, MD;  [facility];  Service: Open Heart Surgery;  Laterality: N/A;  . vein surgery lower extremity       OB History    Gravida  2   Para  1   Term      Preterm      AB      Living        SAB      TAB      Ectopic      Multiple      Live Births               Home Medications    Prior to Admission medications   Medication Sig Start Date End  Date Taking? Authorizing Provider  acetaminophen (TYLENOL) 325 MG tablet Take 2 tablets (650 mg total) by mouth every 6 (six) hours as needed for mild pain. 12/09/15  Yes Sharlene Doryonte, Tessa N, PA-C  aspirin 81 MG tablet Take 81 mg by mouth at bedtime.    Yes [provider]  Cholecalciferol (VITAMIN D) 1000 UNITS capsule Take 2,000 Units by mouth daily.    Yes [provider]  ECHINACEA PO Take 1 capsule by mouth at bedtime.   Yes [provider]  fish oil-omega-3 fatty acids 1000 MG capsule Take 2 g by mouth daily.     Yes [provider]  glucosamine-chondroitin 500-400 MG tablet Take 1 tablet by mouth daily.   Yes [provider]  lisinopril (PRINIVIL,ZESTRIL) 5 MG tablet Take 1 tablet (5 mg total) by mouth daily. 10/26/17  Yes Kathleene HazelMcAlhany, Christopher D, MD  MAGNESIUM CARBONATE PO Take 1 tablet by mouth at bedtime.    Yes [provider]  metoprolol succinate (TOPROL XL) 25 MG 24 hr tablet Take 1 tablet (25 mg total) by mouth daily. 10/26/17  Yes Kathleene HazelMcAlhany, Christopher D, MD  Multiple Vitamin (MULTIVITAMIN WITH MINERALS) TABS Take 1 tablet by mouth at bedtime.    Yes [provider]  NON FORMULARY Apply 0.5 mLs topically daily. 70/30 mix of estriol and estradiol in a cream base. Compounded by custom Pharmacy   Yes [provider]  PRESCRIPTION MEDICATION Apply 0.1 mLs topically daily. Testosterone 2% cream compounded custom pharmacy   Yes [provider]  progesterone (PROMETRIUM) 100 MG capsule Take 100 mg by mouth at bedtime.   Yes [provider]  rosuvastatin (CRESTOR) 10 MG tablet Take 1 tablet (10 mg total) by mouth daily. 01/05/18 01/05/19 Yes Kathleene HazelMcAlhany, Christopher D, MD  vitamin B-12 (CYANOCOBALAMIN) 1000 MCG tablet Take 1,000 mcg by mouth at bedtime.    Yes [provider]  vitamin C (ASCORBIC ACID) 500 MG tablet Take 1,000 mg by mouth at bedtime.    Yes [provider]  warfarin (COUMADIN) 5 MG  tablet TAKE AS DIRECTED BY COUMADIN CLINIC Patient taking differently: Take 5 mg by mouth daily at 6 PM.  04/21/18  Yes McAlhany, Nile Dearhristopher D, MD  amoxicillin (AMOXIL) 500 MG tablet Take 4 tablets 30-60 minutes prior to dental appointment. Patient not taking: Reported on 04/27/2018 06/12/16   Kathleene HazelMcAlhany, Christopher D, MD  ezetimibe (ZETIA) 10 MG tablet Take 1 tablet (10 mg total) by mouth daily. 04/26/18   Kathleene HazelMcAlhany, Christopher D, MD    Family History Family History  Problem Relation Age of Onset  . Heart attack Mother 5746  brain impairment tobacco dm  . Diabetes Mother   . Hyperlipidemia Mother   . Heart disease Sister        whole in heart age 50  . Cancer Paternal Grandmother        breast  . Stroke Paternal Grandmother   . Heart failure Brother     Social History Social History   Tobacco Use  . Smoking status: Never Smoker  . Smokeless tobacco: Never Used  Substance Use Topics  . Alcohol use: Yes    Comment: socially  . Drug use: No     Allergies   Patient has no known allergies.   Review of Systems Review of Systems  Constitutional: Positive for fatigue. Negative for chills, diaphoresis and fever.  HENT: Negative for congestion, rhinorrhea and sneezing.   Eyes: Negative.   Respiratory: Negative for cough, chest tightness and shortness of breath.   Cardiovascular: Positive for chest pain and palpitations. Negative for leg swelling.  Gastrointestinal: Negative for abdominal pain, blood in stool, diarrhea, nausea and vomiting.  Genitourinary: Negative for difficulty urinating, flank pain, frequency and hematuria.  Musculoskeletal: Negative for arthralgias and back pain.  Skin: Negative for rash.  Neurological: Positive for light-headedness. Negative for dizziness, speech difficulty, weakness, numbness and headaches.     Physical Exam Updated Vital Signs BP (!) 131/59   Pulse 62   Temp (!) 97.4 F (36.3 C) (Oral)   Resp (!) 22   SpO2 99%   Physical  Exam Constitutional:      Appearance: She is well-developed.  HENT:     Head: Normocephalic and atraumatic.  Eyes:     Pupils: Pupils are equal, round, and reactive to light.  Neck:     Musculoskeletal: Normal range of motion and neck supple.  Cardiovascular:     Rate and Rhythm: Normal rate. Rhythm irregular.     Heart sounds: Murmur present.     Comments: Mechanical heart valve click Pulmonary:     Effort: Pulmonary effort is normal. No respiratory distress.     Breath sounds: Normal breath sounds. No wheezing or rales.  Chest:     Chest wall: No tenderness.  Abdominal:     General: Bowel sounds are normal.     Palpations: Abdomen is soft.     Tenderness: There is no abdominal tenderness. There is no guarding or rebound.  Musculoskeletal: Normal range of motion.        General: No swelling.  Lymphadenopathy:     Cervical: No cervical adenopathy.  Skin:    General: Skin is warm and dry.     Findings: No rash.  Neurological:     Mental Status: She is alert and oriented to person, place, and time.      ED Treatments / Results  Labs (all labs ordered are listed, but only abnormal results are displayed) Labs Reviewed  PROTIME-INR - Abnormal; Notable for the following components:      Result Value   Prothrombin Time 21.3 (*)    INR 1.9 (*)    All other components within normal limits  BASIC METABOLIC PANEL  CBC  HIV ANTIBODY (ROUTINE TESTING W REFLEX)  TSH  I-STAT TROPONIN, ED    EKG EKG Interpretation  Date/Time:  Tuesday April 27 2018 10:59:16 EST Ventricular Rate:  160 PR Interval:  142 QRS Duration: 72 QT Interval:  336 QTC Calculation: 549 R Axis:   71 Text Interpretation:  Age not entered, assumed to be  58 years old for  purpose of ECG interpretation sinus   rhythm with run of SVT ponged QT interval Confirmed by Rolan Bucco [phone]) on 04/27/2018 11:20:36 AM   Radiology Dg Chest 2 View  Result Date: 04/27/2018 CLINICAL DATA:  Chest pain and  cardiac palpitations EXAM: CHEST - 2 VIEW COMPARISON:  Chest radiograph January 09, 2016 and chest CT November 23, 2017 FINDINGS: There is no appreciable edema or consolidation. Heart size and pulmonary vascularity are normal. No adenopathy. Patient is status post aortic valve replacement. There is aortic atherosclerosis. No bone lesions. No pneumothorax. IMPRESSION: No edema or consolidation. Status post aortic valve replacement. Aortic Atherosclerosis (ICD10-I70.0). Electronically Signed   By: Bretta Bang III M.D.   On: 04/27/2018 10:52    Procedures Procedures (including critical care time)  Medications Ordered in ED Medications  acetaminophen (TYLENOL) tablet 650 mg (has no administration in time range)    Or  acetaminophen (TYLENOL) suppository 650 mg (has no administration in time range)  ondansetron (ZOFRAN) tablet 4 mg (has no administration in time range)    Or  ondansetron (ZOFRAN) injection 4 mg (has no administration in time range)  polyethylene glycol (MIRALAX / GLYCOLAX) packet 17 g (has no administration in time range)  aspirin EC tablet 81 mg (has no administration in time range)  ezetimibe (ZETIA) tablet 10 mg (has no administration in time range)  lisinopril (PRINIVIL,ZESTRIL) tablet 5 mg (has no administration in time range)  rosuvastatin (CRESTOR) tablet 10 mg (has no administration in time range)  metoprolol succinate (TOPROL-XL) 24 hr tablet 12.5 mg (has no administration in time range)  metoprolol succinate (TOPROL-XL) 24 hr tablet 37.5 mg (has no administration in time range)  Warfarin - Pharmacist Dosing Inpatient (has no administration in time range)  warfarin (COUMADIN) tablet 7.5 mg (has no administration in time range)  sodium chloride flush (NS) 0.9 % injection 3 mL (3 mLs Intravenous Given 04/27/18 1121)     Initial Impression / Assessment and Plan / ED Course  I have reviewed the triage vital signs and the nursing notes.  Pertinent labs & imaging  results that were available during my care of the patient were reviewed by me and considered in my medical decision making (see chart for details).        Patient is a 58 year old female who presents with palpitations associated with some dizziness and generalized weakness.  Her labs are non-concerning.  Her EKG shows frequent PACs and on the monitor she is having short runs of atrial fibrillation.  These are self resolved.  She does report compliance with her Coumadin.  Cardiology has been consulted and awaiting recommendations.  Cardiology to admit pt for further evaluation.  Final Clinical Impressions(s) / ED Diagnoses   Final diagnoses:  SVT (supraventricular tachycardia) [facility])    ED Discharge Orders    None       Rolan Bucco, MD 04/27/18 1304

## 2018-04-28 ENCOUNTER — Other Ambulatory Visit: Payer: Self-pay | Admitting: Physician Assistant

## 2018-04-28 ENCOUNTER — Ambulatory Visit: Payer: BLUE CROSS/BLUE SHIELD | Admitting: Physician Assistant

## 2018-04-28 DIAGNOSIS — Z79899 Other long term (current) drug therapy: Secondary | ICD-10-CM

## 2018-04-28 DIAGNOSIS — I471 Supraventricular tachycardia: Secondary | ICD-10-CM

## 2018-04-28 LAB — HIV ANTIBODY (ROUTINE TESTING W REFLEX): HIV Screen 4th Generation wRfx: NONREACTIVE

## 2018-04-28 LAB — BASIC METABOLIC PANEL
Anion gap: 6 (ref 5–15)
BUN: 19 mg/dL (ref 6–20)
CO2: 27 mmol/L (ref 22–32)
Calcium: 9.4 mg/dL (ref 8.9–10.3)
Chloride: 108 mmol/L (ref 98–111)
Creatinine, Ser: 0.99 mg/dL (ref 0.44–1.00)
GFR calc Af Amer: >60 mL/min (ref >60)
GFR calc non Af Amer: >60 mL/min (ref >60)
Glucose, Bld: 97 mg/dL (ref 70–99)
Potassium: 4.7 mmol/L (ref 3.5–5.1)
Sodium: 141 mmol/L (ref 135–145)

## 2018-04-28 LAB — PROTIME-INR
INR: 1.8 — ABNORMAL HIGH (ref 0.8–1.2)
PROTHROMBIN TIME: 21 s — AB (ref 11.4–15.2)

## 2018-04-28 MED ORDER — FLECAINIDE ACETATE 50 MG PO TABS: 50.0000 mg | ORAL_TABLET | Freq: Two times a day (BID) | ORAL | 11 refills | Status: DC

## 2018-04-28 MED ORDER — WARFARIN SODIUM 7.5 MG PO TABS: 7.5000 mg | ORAL_TABLET | Freq: Once | ORAL | Status: DC

## 2018-04-28 MED ORDER — METOPROLOL SUCCINATE ER 25 MG PO TB24: 50.0000 mg | ORAL_TABLET | Freq: Every day | ORAL | 1 refills | Status: DC

## 2018-04-28 MED FILL — FLECAINIDE ACETATE 50 MG TA: 50 | 30 days supply | Qty: 60 | Fill #0 | Status: TO

## 2018-04-28 NOTE — Progress Notes (Signed)
   Progress Note   Subjective   Doing well today, the patient denies CP or SOB.  No new concerns  Inpatient Medications    Scheduled Meds: . aspirin EC  81 mg Oral QHS  . ezetimibe  10 mg Oral Daily  . flecainide  50 mg Oral Q12H  . metoprolol tartrate  25 mg Oral BID  . rosuvastatin  10 mg Oral Daily  . Warfarin - Pharmacist Dosing Inpatient   Does not apply q1800   Continuous Infusions:  PRN Meds: acetaminophen **OR** acetaminophen, ondansetron **OR** ondansetron (ZOFRAN) IV, polyethylene glycol   Vital Signs    Vitals:   04/27/18 2040 04/28/18 0222 04/28/18 0246 04/28/18 0528  BP: 90/67 (!) 126/44  (!) 125/53  Pulse: (!) 174 60  66  Resp: 18 20  20   Temp: 98.3 F (36.8 C) 98.2 F (36.8 C)  98 F (36.7 C)  TempSrc: Oral Oral  Oral  SpO2: 97% 98%  96%  Weight:   72 kg     Intake/Output Summary (Last 24 hours) at 04/28/2018 0831 Last data filed at 04/28/2018 6578 Gross per 24 hour  Intake 480 ml  Output -  Net 480 ml   Filed Weights   04/28/18 0246  Weight: 72 kg    Telemetry    Sinus overnight, without further SVT - Personally Reviewed  Physical Exam   GEN- The patient is well appearing, alert and oriented x 3 today.   Head- normocephalic, atraumatic Eyes-  Sclera clear, conjunctiva pink Ears- hearing intact Oropharynx- clear Neck- supple, Lungs- Clear to ausculation bilaterally, normal work of breathing Heart- Regular rate and rhythm  GI- soft, NT, ND, + BS Extremities- no clubbing, cyanosis, or edema  MS- no significant deformity or atrophy Skin- no rash or lesion Psych- euthymic mood, full affect Neuro- strength and sensation are intact   Labs    Chemistry Recent Labs  Lab 04/27/18 1007 04/28/18 0520  NA 138 141  K 4.2 4.7  CL 106 108  CO2 24 27  GLUCOSE 93 97  BUN 18 19  CREATININE 0.96 0.99  CALCIUM 9.9 9.4  GFRNONAA >60 >60  GFRAA >60 >60  ANIONGAP 8 6     Hematology Recent Labs  Lab 04/27/18 1007  WBC 5.2  RBC  4.39  HGB 12.9  HCT 41.4  MCV 94.3  MCH 29.4  MCHC 31.2  RDW 12.5  PLT 224    Cardiac EnzymesNo results for input(s): TROPONINI in the last 168 hours.  Recent Labs  Lab 04/27/18 1014  TROPIPOC 0.00        Assessment & Plan    1.  SVT Much improved with flecainide 50mg  BID Reduce metoprolol to 25mg  bid  Ok to discharge to home Follow-up with me in 3-4 weeks  Hillis Range MD, [facility] 04/28/2018 8:31 AM

## 2018-04-28 NOTE — Discharge Summary (Addendum)
Discharge Summary    Patient ID: Marissa Bailey,  MRN: [id number], DOB/AGE: 09/30/1960 58 y.o.  Admit date: 04/27/2018 Discharge date: 04/28/2018  Primary Care Provider: Madelin Headings Primary Cardiologist: Dr. Clifton James  Primary Electrophysiologist: Dr. Johney Frame  Discharge Diagnoses    Active Problems:   SVT (supraventricular tachycardia) (HCC)   Allergies No Known Allergies  Diagnostic Studies/Procedures    TTE: 04/27/2018  IMPRESSIONS   1. There is a mechanical aortic valve. Aortic valve regurgitation is trivial by color flow Doppler. Mean gradient 19 mmHg across the mechanical aortic valve, mildly elevated gradient.  2. The mitral valve is normal in structure. There is mild mitral annular calcification present. No evidence of mitral valve stenosis. No significant regurgitation.  3. The left ventricle has normal systolic function with an ejection fraction of 60-65%. The cavity size was normal. There is mildly increased left ventricular wall thickness. Left ventricular diastolic Doppler parameters are consistent with  pseudonormalization No evidence of left ventricular regional wall motion abnormalities.  4. The aortic root and ascending aorta are normal in size and structure.  5. The tricuspid valve is normal in structure.  6. Left atrial size was mildly dilated.  7. The right ventricle has mildly reduced systolic function. The cavity was mildly enlarged. There is no increase in right ventricular wall thickness.  8. The IVC was normal in size. PA systolic pressure 31 mmHg. _____________   History of Present Illness    58 year old female with a history of severe aortic valve insufficiency s/p Bentall procedure and SVT/PACs who presented to the emergency department with complaints of heart palpitations. She stated that on Friday she noticed intermittent episodes of tachycardia and feeling of skipped beats. She stated that with her mechanical valve she is very sensitive to her  PACs. She has had associated dizziness and a frontal headache but denies any shortness of breath, syncope, pre-syncope. She did have a transient episode of chest pain on Sunday night when she lie down to go to sleep but states that it lasted less than 10 seconds. She has not had any exertional symptoms. She last saw Dr. Clifton James in August 2019 and at that point was complaining of generalized fatigue so her metoprolol tartrate was switch to metoprolol succinate at half the dosage. Since that time she has not noticed more frequent PACs. She had episodes similar to this but they have not been this severe. She has not been sick recently. She had not had any other medication changes. She had not been using excessive over-the-counter medications like decongestants. She had not changed her caffeine consumption. Through chart review it did appear that she had a brief episode of paroxysmal atrial fibrillation after her aortic valve replacement in 2017. Denied any orthopnea, PND, lower extremity edema. She was admitted with plans for EP consult.   Hospital Course     Consultants: EP (Dr. Johney Frame)  1. SVT/PACs - Seen by Dr. Johney Frame for EP consult. Continue Metoprolol tartrate 25 mg BID (consildate to Toprol XL 50mg ) and flecainide 50mg  BID - Tele reviewed and decreased frequency with her symptoms improved - will arrange for EKG and outpatient exercise tolerance test  2. AR s/p AVR - S/p Bentall procedure with mechanical AVR in October 2017. - Echo unchanged compared to prior -Will continue coumadin, and ASA. Follow up in the coumadin clinic on 3/3  HLD  - On Rosuvastatin and Zetia   HTN - stable - Continue metoprolol and lisinopril   Proximal Descending Aortic Aneurysm  -  Stable on last CTA in 11/2017, 3.6 cm  - Continue BP and HLD control  - Continue annual follow-up   Marissa Bailey was seen by Dr. Eldridge Dace and determined stable for discharge home. Follow up in the office has been arranged.  Medications are listed below.   _____________  Discharge Vitals Blood pressure (!) 111/59, pulse 68, temperature 98.1 F (36.7 C), temperature source Oral, resp. rate 20, weight 72 kg, SpO2 97 %.  Filed Weights   04/28/18 0246  Weight: 72 kg    Labs & Radiologic Studies    CBC Recent Labs    04/27/18 1007  WBC 5.2  HGB 12.9  HCT 41.4  MCV 94.3  PLT 224   Basic Metabolic Panel Recent Labs    19/14/78 1007 04/28/18 0520  NA 138 141  K 4.2 4.7  CL 106 108  CO2 24 27  GLUCOSE 93 97  BUN 18 19  CREATININE 0.96 0.99  CALCIUM 9.9 9.4   Liver Function Tests No results for input(s): AST, ALT, ALKPHOS, BILITOT, PROT, ALBUMIN in the last 72 hours. No results for input(s): LIPASE, AMYLASE in the last 72 hours. Cardiac Enzymes No results for input(s): CKTOTAL, CKMB, CKMBINDEX, TROPONINI in the last 72 hours. BNP Invalid input(s): POCBNP D-Dimer No results for input(s): DDIMER in the last 72 hours. Hemoglobin A1C No results for input(s): HGBA1C in the last 72 hours. Fasting Lipid Panel No results for input(s): CHOL, HDL, LDLCALC, TRIG, CHOLHDL, LDLDIRECT in the last 72 hours. Thyroid Function Tests Recent Labs    04/27/18 1226  TSH 1.303   _____________  Dg Chest 2 View  Result Date: 04/27/2018 CLINICAL DATA:  Chest pain and cardiac palpitations EXAM: CHEST - 2 VIEW COMPARISON:  Chest radiograph January 09, 2016 and chest CT November 23, 2017 FINDINGS: There is no appreciable edema or consolidation. Heart size and pulmonary vascularity are normal. No adenopathy. Patient is status post aortic valve replacement. There is aortic atherosclerosis. No bone lesions. No pneumothorax. IMPRESSION: No edema or consolidation. Status post aortic valve replacement. Aortic Atherosclerosis (ICD10-I70.0). Electronically Signed   By: Bretta Bang III M.D.   On: 04/27/2018 10:52   Disposition   Pt is being discharged home today in good condition.  Follow-up Plans & Appointments     Follow-up Information    [facility] Office Follow up.   Specialty:  Cardiology Why:  05/05/2018 @ 2:00PM, (EKG) Contact information: [address], [address] [place] [address] [phone]       [facility] [address] Follow up.   Specialty:  Cardiology Why:  05/12/2018 @ 2:00PM, (stress test) Contact information: [address] [place] [address] [phone]       Hillis Range, MD Follow up.   Specialty:  Cardiology Why:  05/26/2018 @ 8:45AM Contact information: [address] [place] [address] [phone]          Discharge Instructions    Diet - low sodium heart healthy   Complete by:  As directed    Discharge instructions   Complete by:  As directed    Please take 1 and 1/2 tablets of your coumadin this evening and then resume your home dose. Please keep your scheduled follow up appts.   Increase activity slowly   Complete by:  As directed      Discharge Medications     Medication List    STOP taking these medications   amoxicillin  500 MG tablet Commonly known as:  AMOXIL     TAKE these medications   acetaminophen 325 MG tablet Commonly known as:  TYLENOL Take 2 tablets (650 mg total) by mouth every 6 (six) hours as needed for mild pain.   aspirin 81 MG tablet Take 81 mg by mouth at bedtime.   ECHINACEA PO Take 1 capsule by mouth at bedtime.   ezetimibe 10 MG tablet Commonly known as:  ZETIA Take 1 tablet (10 mg total) by mouth daily.   fish oil-omega-3 fatty acids 1000 MG capsule Take 2 g by mouth daily.   flecainide 50 MG tablet Commonly known as:  TAMBOCOR Take 1 tablet (50 mg total) by mouth every 12 (twelve) hours.   glucosamine-chondroitin 500-400 MG tablet Take 1 tablet by mouth daily.   lisinopril 5 MG tablet Commonly known as:  PRINIVIL,ZESTRIL Take 1 tablet (5 mg total) by mouth daily.   MAGNESIUM CARBONATE PO Take 1 tablet by  mouth at bedtime.   metoprolol succinate 25 MG 24 hr tablet Commonly known as:  TOPROL XL Take 2 tablets (50 mg total) by mouth daily. What changed:  how much to take   multivitamin with minerals Tabs tablet Take 1 tablet by mouth at bedtime.   NON FORMULARY Apply 0.5 mLs topically daily. 70/30 mix of estriol and estradiol in a cream base. Compounded by [facility]   PRESCRIPTION MEDICATION Apply 0.1 mLs topically daily. Testosterone 2% cream compounded custom pharmacy   progesterone 100 MG capsule Commonly known as:  PROMETRIUM Take 100 mg by mouth at bedtime.   rosuvastatin 10 MG tablet Commonly known as:  CRESTOR Take 1 tablet (10 mg total) by mouth daily.   vitamin B-12 1000 MCG tablet Commonly known as:  CYANOCOBALAMIN Take 1,000 mcg by mouth at bedtime.   vitamin C 500 MG tablet Commonly known as:  ASCORBIC ACID Take 1,000 mg by mouth at bedtime.   Vitamin D 1000 units capsule Take 2,000 Units by mouth daily.   warfarin 5 MG tablet Commonly known as:  COUMADIN Take as directed. If you are unsure how to take this medication, talk to your nurse or doctor. Original instructions:  TAKE AS DIRECTED BY COUMADIN CLINIC What changed:  See the new instructions.       Outstanding Labs/Studies   EKG, and Exercise Treadmill Test  Duration of Discharge Encounter   Greater than 30 minutes including physician time.  Signed, Laverda Page NP-C 04/28/2018, 10:03 AM   I have examined the patient and reviewed assessment and plan and discussed with patient.  Agree with above as stated.  Palpitations much improved.  COntinue flecainide.  No strenuous exercise until she has an ETT in the office. She is agreeable. OK for discharge.  COumadin for anticoagulation given AVR.   Lance Muss

## 2018-04-28 NOTE — Progress Notes (Addendum)
Progress Note  Patient Name: Marissa Bailey Date of Encounter: 04/28/2018  Primary Cardiologist: Verne Carrow, MD   Subjective   Patient feeling significantly better this AM. She is still having some SVT but it is much less frequent. She understands the plan with the flecainide and metoprolol. Agrees with conservative management. Asking if she can go home.   Inpatient Medications    Scheduled Meds: . aspirin EC  81 mg Oral QHS  . ezetimibe  10 mg Oral Daily  . flecainide  50 mg Oral Q12H  . metoprolol tartrate  25 mg Oral BID  . rosuvastatin  10 mg Oral Daily  . warfarin  7.5 mg Oral ONCE-1800  . Warfarin - Pharmacist Dosing Inpatient   Does not apply q1800   Continuous Infusions:  PRN Meds: acetaminophen **OR** acetaminophen, ondansetron **OR** ondansetron (ZOFRAN) IV, polyethylene glycol   Vital Signs    Vitals:   04/27/18 2040 04/28/18 0222 04/28/18 0246 04/28/18 0528  BP: 90/67 (!) 126/44  (!) 125/53  Pulse: (!) 174 60  66  Resp: 18 20  20   Temp: 98.3 F (36.8 C) 98.2 F (36.8 C)  98 F (36.7 C)  TempSrc: Oral Oral  Oral  SpO2: 97% 98%  96%  Weight:   72 kg     Intake/Output Summary (Last 24 hours) at 04/28/2018 0916 Last data filed at 04/28/2018 3419 Gross per 24 hour  Intake 480 ml  Output -  Net 480 ml   Filed Weights   04/28/18 0246  Weight: 72 kg   Telemetry    NSR with PACs and SVT - Personally Reviewed  ECG    No new EKG - Personally Reviewed  Physical Exam   GEN: No acute distress.   Neck: No JVD Cardiac: RRR, no murmurs, rubs, or gallops.  Respiratory: Clear to auscultation bilaterally. GI: Soft, nontender, non-distended  MS: No edema; No deformity. Neuro:  Nonfocal  Psych: Normal affect   Labs    Chemistry Recent Labs  Lab 04/27/18 1007 04/28/18 0520  NA 138 141  K 4.2 4.7  CL 106 108  CO2 24 27  GLUCOSE 93 97  BUN 18 19  CREATININE 0.96 0.99  CALCIUM 9.9 9.4  GFRNONAA >60 >60  GFRAA >60 >60  ANIONGAP 8 6    Hematology Recent Labs  Lab 04/27/18 1007  WBC 5.2  RBC 4.39  HGB 12.9  HCT 41.4  MCV 94.3  MCH 29.4  MCHC 31.2  RDW 12.5  PLT 224   Cardiac EnzymesNo results for input(s): TROPONINI in the last 168 hours.  Recent Labs  Lab 04/27/18 1014  TROPIPOC 0.00     BNPNo results for input(s): BNP, PROBNP in the last 168 hours.   DDimer No results for input(s): DDIMER in the last 168 hours.   Radiology    Dg Chest 2 View  Result Date: 04/27/2018 CLINICAL DATA:  Chest pain and cardiac palpitations EXAM: CHEST - 2 VIEW COMPARISON:  Chest radiograph January 09, 2016 and chest CT November 23, 2017 FINDINGS: There is no appreciable edema or consolidation. Heart size and pulmonary vascularity are normal. No adenopathy. Patient is status post aortic valve replacement. There is aortic atherosclerosis. No bone lesions. No pneumothorax. IMPRESSION: No edema or consolidation. Status post aortic valve replacement. Aortic Atherosclerosis (ICD10-I70.0). Electronically Signed   By: Bretta Bang III M.D.   On: 04/27/2018 10:52   Cardiac Studies   TTE 04/27/2018  1. There is a mechanical aortic valve. Aortic valve  regurgitation is trivial by color flow Doppler. Mean gradient 19 mmHg across the mechanical aortic valve, mildly elevated gradient.  2. The mitral valve is normal in structure. There is mild mitral annular calcification present. No evidence of mitral valve stenosis. No significant regurgitation.  3. The left ventricle has normal systolic function with an ejection fraction of 60-65%. The cavity size was normal. There is mildly increased left ventricular wall thickness. Left ventricular diastolic Doppler parameters are consistent with  pseudonormalization No evidence of left ventricular regional wall motion abnormalities.  4. The aortic root and ascending aorta are normal in size and structure.  5. The tricuspid valve is normal in structure.  6. Left atrial size was mildly dilated.  7.  The right ventricle has mildly reduced systolic function. The cavity was mildly enlarged. There is no increase in right ventricular wall thickness.  8. The IVC was normal in size. PA systolic pressure 31 mmHg.  Patient Profile     Kafi Kezer is a 58 y.o. female with a hx of AR s/p AVR in 2017 and PAC/SVT who is being seen today for the evaluation of PACs/SVT  Assessment & Plan    SVT/PACs - Tele reviewed. Decreased frequency  - Symptoms improving  - Appreciate EP consult. Continue Metoprolol tartrate 25 mg BID and flecainide 50mg  BID  AR s/p AVR - S/p Bentall procedure with mechanical AVR in October 2017. - Echo unchanged compared to prior - Will continue coumadin.  HLD  - On Rosuvastatin and Zetia   HTN - Goal <130/80  - Continue metoprolol and lisinopril   Proximal Descending Aortic Aneurysm  - Stable on last CTA in 11/2017, 3.6 cm  - Continue BP and HLD control  - Continue annual follow-up   Will discuss the case further with Dr. Eldridge Dace.   For questions or updates, please contact [facility] Please consult [email or web address] for contact info under Cardiology/STEMI.   Signed, Levora Dredge, MD  04/28/2018, 9:16 AM    I have examined the patient and reviewed assessment and plan and discussed with patient.  Agree with above as stated.  Palpitations much improved.  COntinue flecainide.  No strenuous exercise until she has an ETT in the office. She is agreeable. OK for discharge.  COumadin for anticoagulation given AVR.   Lance Muss

## 2018-04-28 NOTE — Progress Notes (Signed)
ANTICOAGULATION CONSULT NOTE - Follow Up Consult  Pharmacy Consult:  Coumadin Indication:  Mechanical AVR   No Known Allergies  Patient Measurements: Weight: 158 lb 12.8 oz (72 kg)  Vital Signs: Temp: 98 F (36.7 C) (02/26 0528) Temp Source: Oral (02/26 0528) BP: 125/53 (02/26 0528) Pulse Rate: 66 (02/26 0528)  Labs: Recent Labs    04/27/18 1007 04/28/18 0520  HGB 12.9  --   HCT 41.4  --   PLT 224  --   LABPROT 21.3* 21.0*  INR 1.9* 1.8*  CREATININE 0.96 0.99    CrCl cannot be calculated (Unknown ideal weight.).   Assessment: 26 YOF here for evaluation of PACs in setting of SVT.  Pharmacy consulted to continue Coumadin from PTA for history of mechanical AVR.  INR sub-therapeutic and trending down.  No bleeding reported.  Home Coumadin dose:  5mg  PO daily  Goal of Therapy:  INR 2-3 Monitor platelets by anticoagulation protocol: Yes   Plan:  Repeat Coumadin 7.5mg  PO today if still here Daily PT / INR   Michalle Rademaker D. Laney Potash, PharmD, BCPS, BCCCP 04/28/2018, 8:45 AM

## 2018-04-28 NOTE — Progress Notes (Signed)
Discharge instructions given, questions answered. IV and tele removed. Awaiting transition care medications.

## 2018-04-28 NOTE — Plan of Care (Signed)

## 2018-05-04 ENCOUNTER — Ambulatory Visit (INDEPENDENT_AMBULATORY_CARE_PROVIDER_SITE_OTHER): Payer: BLUE CROSS/BLUE SHIELD | Admitting: *Deleted

## 2018-05-04 DIAGNOSIS — Z5181 Encounter for therapeutic drug level monitoring: Secondary | ICD-10-CM

## 2018-05-04 DIAGNOSIS — Z952 Presence of prosthetic heart valve: Secondary | ICD-10-CM

## 2018-05-04 LAB — POCT INR: INR: 2.4 (ref 2.0–3.0)

## 2018-05-04 NOTE — Patient Instructions (Signed)
Description   Continue taking 5mg  daily.  Recheck INR in 3 weeks with MD appt (usually 6 weeks).  Coumadin Clinic [phone]

## 2018-05-05 ENCOUNTER — Ambulatory Visit: Payer: BLUE CROSS/BLUE SHIELD

## 2018-05-07 ENCOUNTER — Ambulatory Visit: Payer: BLUE CROSS/BLUE SHIELD | Admitting: *Deleted

## 2018-05-07 DIAGNOSIS — Z79899 Other long term (current) drug therapy: Secondary | ICD-10-CM

## 2018-05-07 DIAGNOSIS — I471 Supraventricular tachycardia: Secondary | ICD-10-CM

## 2018-05-07 NOTE — Progress Notes (Signed)
1.) Reason for visit:  EKG  2.) Name of MD requesting visit: Dr. Johney Frame  3.) H&P: Pt seen at [facility] on 2/25 for SVT  4.) ROS related to problem: consulted by Dr. Johney Frame, started on Flecainide 50 mg BID.  Here today for follow up EKG.  EKG reviewed by Dr. Excell Seltzer.  No changes recommended.  Pt has not been symptomatic at all.  Not noticed any palpitations or heart racing.  5.) Assessment and plan per MD: Has f/u with Dr. Johney Frame scheduled 05/26/18, which she may have to reschedule.  I will send a message to Dr. Jenel Lucks scheduler.  Pt will continue same medications.

## 2018-05-12 ENCOUNTER — Ambulatory Visit (INDEPENDENT_AMBULATORY_CARE_PROVIDER_SITE_OTHER): Payer: BLUE CROSS/BLUE SHIELD

## 2018-05-12 DIAGNOSIS — I471 Supraventricular tachycardia: Secondary | ICD-10-CM

## 2018-05-12 DIAGNOSIS — Z79899 Other long term (current) drug therapy: Secondary | ICD-10-CM | POA: Diagnosis not present

## 2018-05-12 LAB — EXERCISE TOLERANCE TEST
CSEPPHR: 117 {beats}/min
Estimated workload: 10.1 METS
Exercise duration (min): 8 min
Exercise duration (sec): 35 s
MPHR: 161 {beats}/min
Percent HR: 72 %
RPE: 17
Rest HR: 62 {beats}/min

## 2018-05-13 ENCOUNTER — Telehealth: Payer: Self-pay | Admitting: Physician Assistant

## 2018-05-13 NOTE — Telephone Encounter (Signed)
Called the patient to give/discuss stress test result.  No answer, left message to call to review her test result, non-urgent.  The patient had EST to flecainide therapy, she had no concerning QRS changes no arrhythmias as far as medication management goes.  There was description of EKG changes that may be suggestive of CAD.  However, she had cath 11/2015 with NO CAD.  Discussed with Dr. Johney Frame, If the patient is not having any CP or worrisome/suggestive symptoms for CAD, no need for any further evaluation.  If any symptoms of concern, could plan for stress Myoview.  Francis Dowse, PA-C

## 2018-05-17 NOTE — Telephone Encounter (Signed)
LMOVM OF CONTACTING BACK TO SEE IF MESSAGE WAS RECEIVED FROM [facility]

## 2018-05-20 NOTE — Telephone Encounter (Signed)
Left message to call back  

## 2018-05-20 NOTE — Telephone Encounter (Signed)
Spoke with pt and went over results and recommendations.  Pt verbalized understanding.  Pt mentioned that she has been dealing with an "upset stomach" for about a week now.  Wasn't sure if it may be coming from one of her new meds.  Metoprolol dose was changed, Zetia added and flecainide changed at recent hospital stay in February.  Was fine until about a week ago.  Wanted to know what she could take OTC for upset stomach?  She also mentioned that she has been eating bland foods since this started so she has not been taking in the same amount of greens that she normally does and she is on Coumadin.  Advised I would send message to PharmD for review.

## 2018-05-20 NOTE — Telephone Encounter (Signed)
Spoke with pt and went over recommendations from [employer].  Pt verbalized understanding and was appreciative for assistance.

## 2018-05-20 NOTE — Telephone Encounter (Signed)
Patient returned call

## 2018-05-20 NOTE — Telephone Encounter (Signed)
Zetia may be causing her GI symptoms. She can try cutting her Zetia tablets in half and taking 5mg  daily - this provides comparable LDL lowering but has fewer GI symptoms.   Cannot make warfarin adjustments based on decreased vitamin K intake without checking her INR and would not warrant this a high risk situation to bring her into clinic early.  Hopefully Zetia dose decrease will help resolve symptoms. She can take Tums if needed, would avoid Immodium since it's QTc prolonging and she takes flecainide.

## 2018-05-21 ENCOUNTER — Encounter: Payer: Self-pay | Admitting: Internal Medicine

## 2018-05-25 ENCOUNTER — Telehealth: Payer: Self-pay | Admitting: Pharmacist

## 2018-05-25 ENCOUNTER — Telehealth: Payer: Self-pay

## 2018-05-25 NOTE — Telephone Encounter (Signed)

## 2018-05-25 NOTE — Telephone Encounter (Signed)
Call placed to Pt.  Advised of virtual visit scheduled for tomorrow.  Advised to check MyChart for consent.  Advised to download Webex to Pt's smart phone.

## 2018-05-26 ENCOUNTER — Other Ambulatory Visit: Payer: Self-pay

## 2018-05-26 ENCOUNTER — Ambulatory Visit (INDEPENDENT_AMBULATORY_CARE_PROVIDER_SITE_OTHER): Payer: BLUE CROSS/BLUE SHIELD | Admitting: Pharmacist

## 2018-05-26 ENCOUNTER — Telehealth: Payer: Self-pay | Admitting: Internal Medicine

## 2018-05-26 ENCOUNTER — Telehealth (INDEPENDENT_AMBULATORY_CARE_PROVIDER_SITE_OTHER): Payer: BLUE CROSS/BLUE SHIELD | Admitting: Internal Medicine

## 2018-05-26 ENCOUNTER — Ambulatory Visit: Payer: BLUE CROSS/BLUE SHIELD | Admitting: Internal Medicine

## 2018-05-26 DIAGNOSIS — Z79899 Other long term (current) drug therapy: Secondary | ICD-10-CM

## 2018-05-26 DIAGNOSIS — Z5181 Encounter for therapeutic drug level monitoring: Secondary | ICD-10-CM | POA: Diagnosis not present

## 2018-05-26 DIAGNOSIS — I10 Essential (primary) hypertension: Secondary | ICD-10-CM | POA: Diagnosis not present

## 2018-05-26 DIAGNOSIS — Z952 Presence of prosthetic heart valve: Secondary | ICD-10-CM | POA: Diagnosis not present

## 2018-05-26 DIAGNOSIS — I351 Nonrheumatic aortic (valve) insufficiency: Secondary | ICD-10-CM | POA: Diagnosis not present

## 2018-05-26 DIAGNOSIS — I471 Supraventricular tachycardia: Secondary | ICD-10-CM | POA: Diagnosis not present

## 2018-05-26 LAB — POCT INR: INR: 3 (ref 2.0–3.0)

## 2018-05-26 NOTE — Telephone Encounter (Signed)
Pt unable to access her MyChart account.  She does have a smart phone and has downloaded Webex app.  She understands we can do the visit via her smart phone. She is aware we will contact her when it is time for visit.

## 2018-05-26 NOTE — Telephone Encounter (Signed)
Follow Up:; ° ° °Returning your call. °

## 2018-05-26 NOTE — Progress Notes (Signed)
Electrophysiology TeleHealth Note   Due to national recommendations of social distancing due to COVID 19, an audio/video telehealth visit is felt to be most appropriate for this patient at this time.  Verbal consent was obtained by me today   Date:  05/26/2018   ID:  Marissa Bailey, DOB 1960/12/04, MRN [id number]  Location: patient's home  Provider location: [address], [place]  Evaluation Performed: Follow-up visit  PCP:  Madelin Headings, MD  Cardiologist:  Verne Carrow, MD  Electrophysiologist:  None   Chief Complaint:  SVT  History of Present Illness:    Marissa Bailey is a 58 y.o. female who presents via audio/video conferencing for a telehealth visit today.  Since last being seen in our clinic, the patient reports doing very well.  I saw her 04/2018 for SVT consult.  She had mid RP SVT in the setting of prior AV surgery and Bentall.  I started her on flecainide.  She has done very well since that time.  Today, she denies symptoms of palpitations, chest pain, shortness of breath,  lower extremity edema, dizziness, presyncope, or syncope.  The patient is otherwise without complaint today.  The patient denies symptoms of fevers, chills, cough, or new SOB worrisome for COVID 19.   Past Medical History:  Diagnosis Date   Abnormal TSH    Anemia    Anxiety    Aortic valve insufficiency    a. s/p Bentall with mechanical aortic valve replacement, with replacement of the ascending aorta and proximal aortic arch 12/2015.   Hyperlipidemia, mixed    Migraines    menstrual   Patent foramen ovale    Premature atrial contractions    PSVT (paroxysmal supraventricular tachycardia) (HCC)    a. 48 hour monitor in 2013 with NSR, PACs and several short runs of SVT (15 beats).   Seasonal allergies    Thoracic aortic aneurysm (HCC)    a. s/p Bentall 2017.    Past Surgical History:  Procedure Laterality Date   BENTALL PROCEDURE N/A 12/03/2015   Procedure:  BENTALL PROCEDURE USING [facility] AORTIC VALVE CONDUIT;  Surgeon: Alleen Borne, MD;  Location: [facility] OR;  Service: Open Heart Surgery;  Laterality: N/A;  CIRC ARREST   CARDIAC CATHETERIZATION N/A 11/15/2015   Procedure: Right/Left Heart Cath and Coronary Angiography;  Surgeon: Kathleene Hazel, MD;  Location: [facility] INVASIVE CV LAB;  Service: Cardiovascular;  Laterality: N/A;   DILATION AND CURETTAGE OF UTERUS     EXPLORATORY LAPAROTOMY     for tubal pregnancy   EYE SURGERY Bilateral    lasik   REPLACEMENT ASCENDING AORTA N/A 12/03/2015   Procedure: REPLACEMENT ASCENDING AORTA USING HEMASHIELD GRAFT;  Surgeon: Alleen Borne, MD;  Location: [facility] OR;  Service: Open Heart Surgery;  Laterality: N/A;   TEE WITHOUT CARDIOVERSION N/A 11/07/2015   Procedure: TRANSESOPHAGEAL ECHOCARDIOGRAM (TEE);  Surgeon: Chrystie Nose, MD;  Location: [facility] ENDOSCOPY;  Service: Cardiovascular;  Laterality: N/A;   TEE WITHOUT CARDIOVERSION N/A 12/03/2015   Procedure: TRANSESOPHAGEAL ECHOCARDIOGRAM (TEE);  Surgeon: Alleen Borne, MD;  Location: [facility] OR;  Service: Open Heart Surgery;  Laterality: N/A;   vein surgery lower extremity      Current Outpatient Medications  Medication Sig Dispense Refill   acetaminophen (TYLENOL) 325 MG tablet Take 2 tablets (650 mg total) by mouth every 6 (six) hours as needed for mild pain.     aspirin 81 MG tablet Take 81 mg by mouth  at bedtime.      Cholecalciferol (VITAMIN D) 1000 UNITS capsule Take 2,000 Units by mouth daily.      ECHINACEA PO Take 1 capsule by mouth at bedtime.     ezetimibe (ZETIA) 10 MG tablet Take 1 tablet (10 mg total) by mouth daily. 90 tablet 3   fish oil-omega-3 fatty acids 1000 MG capsule Take 2 g by mouth daily.       flecainide (TAMBOCOR) 50 MG tablet Take 1 tablet (50 mg total) by mouth every 12 (twelve) hours. 60 tablet 11   glucosamine-chondroitin 500-400 MG tablet Take 1 tablet by mouth daily.     lisinopril (PRINIVIL,ZESTRIL) 5 MG  tablet Take 1 tablet (5 mg total) by mouth daily. 90 tablet 3   MAGNESIUM CARBONATE PO Take 1 tablet by mouth at bedtime.      metoprolol succinate (TOPROL XL) 25 MG 24 hr tablet Take 2 tablets (50 mg total) by mouth daily. 90 tablet 1   Multiple Vitamin (MULTIVITAMIN WITH MINERALS) TABS Take 1 tablet by mouth at bedtime.      NON FORMULARY Apply 0.5 mLs topically daily. 70/30 mix of estriol and estradiol in a cream base. Compounded by custom Pharmacy     PRESCRIPTION MEDICATION Apply 0.1 mLs topically daily. Testosterone 2% cream compounded custom pharmacy     progesterone (PROMETRIUM) 100 MG capsule Take 100 mg by mouth at bedtime.     rosuvastatin (CRESTOR) 10 MG tablet Take 1 tablet (10 mg total) by mouth daily. 30 tablet 11   vitamin B-12 (CYANOCOBALAMIN) 1000 MCG tablet Take 1,000 mcg by mouth at bedtime.      vitamin C (ASCORBIC ACID) 500 MG tablet Take 1,000 mg by mouth at bedtime.      warfarin (COUMADIN) 5 MG tablet TAKE AS DIRECTED BY COUMADIN CLINIC (Patient taking differently: Take 5 mg by mouth daily at 6 PM. ) 105 tablet 1   No current facility-administered medications for this visit.     Allergies:   Patient has no known allergies.   Social History:  The patient  reports that she has never smoked. She has never used smokeless tobacco. She reports current alcohol use. She reports that she does not use drugs.  She works for [employer]  Family History:  The patient's  family history includes Cancer in her paternal grandmother; Diabetes in her mother; Heart attack (age of onset: 9) in her mother; Heart disease in her sister; Heart failure in her brother; Hyperlipidemia in her mother; Stroke in her paternal grandmother.   ROS:  Please see the history of present illness.   All other systems are personally reviewed and negative.    Exam:    Vital Signs:: no vitals today  Well appearing, alert and conversant, regular work of breathing,  good skin  color Eyes- anicteric, neuro- grossly intact, skin- no apparent rash or lesions or cyanosis, mouth- oral mucosa is pink   Labs/Other Tests and Data Reviewed:    Recent Labs: 03/08/2018: ALT 34 04/27/2018: Hemoglobin 12.9; Platelets 224; TSH 1.303 04/28/2018: BUN 19; Creatinine, Ser 0.99; Potassium 4.7; Sodium 141   Wt Readings from Last 3 Encounters:  04/28/18 158 lb 12.8 oz (72 kg)  10/26/17 156 lb (70.8 kg)  07/21/16 156 lb 12.8 oz (71.1 kg)     Other studies personally reviewed: Additional studies/ records that were reviewed today include: my consult office note, echo 04/27/2018- EF 60%, mechanical AV, mild LA enlargement  Review of the above  records today demonstrates: as above Prior radiographs: CXR 04/27/2018 reveals no edema    ASSESSMENT & PLAN:    1.  SVT (mid RP) Doing well with flecainide Ultimately, we should consider ablation. I think for now, it is important to continue medical therapy in order to avoid unnecessary coronaviral exposure  2. S/p AVR On coumadin  3. HTN Stable No change required today  4. COVID 19 screen The patient denies symptoms of COVID 19 at this time.  The importance of social distancing was discussed today.  Follow-up:  4 month office visit  Current medicines are reviewed at length with the patient today.   The patient does not have concerns regarding her medicines.  The following changes were made today:  none  Labs/ tests ordered today include:  No orders of the defined types were placed in this encounter.    Patient Risk:  after full review of this patients clinical status, I feel that they are at moderate risk at this time.  Today, I have spent 22 minutes with the patient with telehealth technology discussing SVT .    Randolm Idol, MD  05/26/2018 10:35 AM     [facility] [address] [place] [address] [phone] (office) [phone] (fax)

## 2018-05-31 ENCOUNTER — Ambulatory Visit: Payer: BLUE CROSS/BLUE SHIELD | Admitting: Internal Medicine

## 2018-07-14 ENCOUNTER — Telehealth: Payer: Self-pay | Admitting: Cardiovascular Disease

## 2018-07-14 NOTE — Telephone Encounter (Signed)
° ° °  Patient calling to discuss plan of care and medications. She is relocating and has questions about medications

## 2018-07-15 ENCOUNTER — Telehealth: Payer: Self-pay | Admitting: *Deleted

## 2018-07-15 NOTE — Telephone Encounter (Signed)
Video visit-May [id number],2020.  Doximity Consent obtained-May 16,[id number],2020  Virtual Visit Pre-Appointment Phone Call  "(Name), I am calling you today to discuss your upcoming appointment. We are currently trying to limit exposure to the virus that causes COVID-19 by seeing patients at home rather than in the office."  "What is the BEST phone number to call the day of the visit?" - [phone]  1. "Do you have or have access to (through a family member/friend) a smartphone with video capability that we can use for your visit?" yes a. If yes - list this number in appt notes as "cell" (if different from BEST phone #) and list the appointment type as a VIDEO visit in appointment notes b. If no - list the appointment type as a PHONE visit in appointment notes  2. Confirm consent - "In the setting of the current Covid19 crisis, you are scheduled for a video visit with your provider on May 19,2020 at 8:30.   Just as we do with many in-office visits, in order for you to participate in this visit, we must obtain consent.  If you'd like, I can send this to your mychart (if signed up) or email for you to review.  Otherwise, I can obtain your verbal consent now.  All virtual visits are billed to your insurance company just like a normal visit would be.  By agreeing to a virtual visit, we'd like you to understand that the technology does not allow for your provider to perform an examination, and thus may limit your provider's ability to fully assess your condition. If your provider identifies any concerns that need to be evaluated in person, we will make arrangements to do so.  Finally, though the technology is pretty good, we cannot assure that it will always work on either your or our end, and in the setting of a video visit, we may have to convert it to a phone-only visit.  In either situation, we cannot ensure that we have a secure connection.  Are you willing to proceed?" STAFF: Did the patient verbally acknowledge  consent to telehealth visit? Document YES/NO here: yes  3. Advise patient to be prepared - "Two hours prior to your appointment, go ahead and check your blood pressure, pulse, oxygen saturation, and your weight (if you have the equipment to check those) and write them all down. When your visit starts, your provider will ask you for this information. If you have an Apple Watch or Kardia device, please plan to have heart rate information ready on the day of your appointment. Please have a pen and paper handy nearby the day of the visit as well."  4. Give patient instructions for MyChart download to smartphone OR Doximity/Doxy.me as below if video visit (depending on what platform provider is using)  5. Inform patient they will receive a phone call 15 minutes prior to their appointment time (may be from unknown caller ID) so they should be prepared to answer    TELEPHONE CALL NOTE  Marissa Bailey has been deemed a candidate for a follow-up tele-health visit to limit community exposure during the Covid-19 pandemic. I spoke with the patient via phone to ensure availability of phone/video source, confirm preferred email & phone number, and discuss instructions and expectations.  I reminded Marissa Bailey to be prepared with any vital sign and/or heart rhythm information that could potentially be obtained via home monitoring, at the time of her visit. I reminded Marissa Bailey to expect a phone call prior  to her visit.  Marissa Rouge, RN 07/15/2018 4:54 PM   INSTRUCTIONS FOR DOWNLOADING THE MYCHART APP TO SMARTPHONE  - The patient must first make sure to have activated MyChart and know their login information - If Apple, go to [employer] and type in MyChart in the search bar and download the app. If Android, ask patient to go to [employer] and type in [place] in the search bar and download the app. The app is free but as with any other app downloads, their phone may require them to verify saved payment  information or Apple/Android password.  - The patient will need to then log into the app with their MyChart username and password, and select [facility] as their healthcare provider to link the account. When it is time for your visit, go to the MyChart app, find appointments, and click Begin Video Visit. Be sure to Select Allow for your device to access the Microphone and Camera for your visit. You will then be connected, and your provider will be with you shortly.  **If they have any issues connecting, or need assistance please contact MyChart service desk [phone])**  **If using a computer, in order to ensure the best quality for their visit they will need to use either of the following Internet Browsers: [employer] or Google Chrome**  IF USING DOXIMITY or DOXY.ME - The patient will receive a link just prior to their visit by text.     FULL LENGTH CONSENT FOR TELE-HEALTH VISIT   I hereby voluntarily request, consent and authorize [facility] and its employed or contracted physicians, physician assistants, nurse practitioners or other licensed health care professionals (the Practitioner), to provide me with telemedicine health care services (the "Services") as deemed necessary by the treating Practitioner. I acknowledge and consent to receive the Services by the Practitioner via telemedicine. I understand that the telemedicine visit will involve communicating with the Practitioner through live audiovisual communication technology and the disclosure of certain medical information by electronic transmission. I acknowledge that I have been given the opportunity to request an in-person assessment or other available alternative prior to the telemedicine visit and am voluntarily participating in the telemedicine visit.  I understand that I have the right to withhold or withdraw my consent to the use of telemedicine in the course of my care at any time, without affecting my right  to future care or treatment, and that the Practitioner or I may terminate the telemedicine visit at any time. I understand that I have the right to inspect all information obtained and/or recorded in the course of the telemedicine visit and may receive copies of available information for a reasonable fee.  I understand that some of the potential risks of receiving the Services via telemedicine include:  Marland Kitchen Delay or interruption in medical evaluation due to technological equipment failure or disruption; . Information transmitted may not be sufficient (e.g. poor resolution of images) to allow for appropriate medical decision making by the Practitioner; and/or  . In rare instances, security protocols could fail, causing a breach of personal health information.  Furthermore, I acknowledge that it is my responsibility to provide information about my medical history, conditions and care that is complete and accurate to the best of my ability. I acknowledge that Practitioner's advice, recommendations, and/or decision may be based on factors not within their control, such as incomplete or inaccurate data provided by me or distortions of diagnostic images or specimens that may result from electronic transmissions.  I understand that the practice of medicine is not an exact science and that Practitioner makes no warranties or guarantees regarding treatment outcomes. I acknowledge that I will receive a copy of this consent concurrently upon execution via email to the email address I last provided but may also request a printed copy by calling the office of [place].    I understand that my insurance will be billed for this visit.   I have read or had this consent read to me. . I understand the contents of this consent, which adequately explains the benefits and risks of the Services being provided via telemedicine.  . I have been provided ample opportunity to ask questions regarding this consent and the Services  and have had my questions answered to my satisfaction. . I give my informed consent for the services to be provided through the use of telemedicine in my medical care  By participating in this telemedicine visit I agree to the above.

## 2018-07-15 NOTE — Telephone Encounter (Signed)
I spoke with pt. She will be moving to [facility] soon and would like visit prior to moving. I scheduled her for video visit with Jacolyn Reedy, PA on May 19,2020 at 8:30.  Pt aware to check BP and weigh prior to visit.   Pt is asking if Dr. Clifton James has recommendation for cardiologist near [facility].  This is between [employer] and [place].

## 2018-07-15 NOTE — Telephone Encounter (Signed)
Call returned to Pt.  Pt is moving in a few weeks (last day in [place] is June 1).  She would like an appointment with Dr. Clifton James before she moves.  Advised I would send to Dr. Gibson Ramp nurse/schedulers for follow up.

## 2018-07-16 ENCOUNTER — Telehealth: Payer: Self-pay | Admitting: *Deleted

## 2018-07-16 ENCOUNTER — Telehealth: Payer: Self-pay

## 2018-07-16 NOTE — Telephone Encounter (Signed)

## 2018-07-16 NOTE — Telephone Encounter (Signed)
Yes I will, thanks

## 2018-07-16 NOTE — Telephone Encounter (Signed)
LMOM FOR PRESCREEN  

## 2018-07-16 NOTE — Telephone Encounter (Signed)
I do not know of a cardiologist in that area. Thayer Ohm

## 2018-07-19 ENCOUNTER — Other Ambulatory Visit: Payer: BLUE CROSS/BLUE SHIELD

## 2018-07-19 ENCOUNTER — Other Ambulatory Visit: Payer: Self-pay

## 2018-07-19 ENCOUNTER — Ambulatory Visit (INDEPENDENT_AMBULATORY_CARE_PROVIDER_SITE_OTHER): Payer: BLUE CROSS/BLUE SHIELD | Admitting: *Deleted

## 2018-07-19 DIAGNOSIS — Z79899 Other long term (current) drug therapy: Secondary | ICD-10-CM | POA: Diagnosis not present

## 2018-07-19 DIAGNOSIS — I712 Thoracic aortic aneurysm, without rupture, unspecified: Secondary | ICD-10-CM | POA: Insufficient documentation

## 2018-07-19 DIAGNOSIS — Z952 Presence of prosthetic heart valve: Secondary | ICD-10-CM | POA: Diagnosis not present

## 2018-07-19 DIAGNOSIS — Z5181 Encounter for therapeutic drug level monitoring: Secondary | ICD-10-CM | POA: Diagnosis not present

## 2018-07-19 LAB — POCT INR: INR: 3.5 — AB (ref 2.0–3.0)

## 2018-07-19 NOTE — Progress Notes (Signed)
Virtual Visit via Video Note   This visit type was conducted due to national recommendations for restrictions regarding the COVID-19 Pandemic (e.g. social distancing) in an effort to limit this patient's exposure and mitigate transmission in our community.  Due to her co-morbid illnesses, this patient is at least at moderate risk for complications without adequate follow up.  This format is felt to be most appropriate for this patient at this time.  All issues noted in this document were discussed and addressed.  A limited physical exam was performed with this format.  Please refer to the patient's chart for her consent to telehealth for [facility].   Date:  07/20/2018   ID:  Marissa Bailey, DOB [birth year], MRN [id number]  Patient Location: Home Provider Location: Home  PCP:  Madelin Headings, MD  Cardiologist:  Verne Carrow, MD   Electrophysiologist:  Hillis Range, MD   Evaluation Performed:  Follow-Up Visit  Chief Complaint:   F/U  History of Present Illness:    Marissa Bailey is a 58 y.o. female with history of severe aortic valve insufficiency status post Bentall procedure mechanical aortic valve replacement with replacement of ascending aorta and proximal aortic arch 12/2015 on Coumadin, history of SVT treated with flecainide and followed by Dr. Johney Frame and will consider ablation.  Cardiac cath 11/2015 normal coronary arteries.  Also has mildly dilated thoracic aorta, hypertension and hyperlipidemia.  Last saw Dr. Clifton James 10/2017 and was doing well.  Repeat CTA was done and showed stable repair of aortic root and ascending aorta with only mildly dilated proximal descending thoracic aorta.  Last saw Dr. Johney Frame 05/26/2018 and he recommended ablation in the future.   Patient say she is doing well on Flecainide without palpitations. Denies chest pain, palpitations, dyspnea, dyspnea on exertion, dizziness or presyncope. Moving to [facility] in June and will not have insurance.   The  patient does not have symptoms concerning for COVID-19 infection (fever, chills, cough, or new shortness of breath).    Past Medical History:  Diagnosis Date  . Abnormal TSH   . Anemia   . Anxiety   . Aortic valve insufficiency    a. s/p Bentall with mechanical aortic valve replacement, with replacement of the ascending aorta and proximal aortic arch 12/2015.  Marland Kitchen Hyperlipidemia, mixed   . Migraines    menstrual  . Patent foramen ovale   . Premature atrial contractions   . PSVT (paroxysmal supraventricular tachycardia) (HCC)    a. 48 hour monitor in 2013 with NSR, PACs and several short runs of SVT (15 beats).  . Seasonal allergies   . Thoracic aortic aneurysm (HCC)    a. s/p Bentall 2017.   Past Surgical History:  Procedure Laterality Date  . BENTALL PROCEDURE N/A 12/03/2015   Procedure: BENTALL PROCEDURE USING ST. JUDE AORTIC VALVE CONDUIT;  Surgeon: Alleen Borne, MD;  Location: [facility] OR;  Service: Open Heart Surgery;  Laterality: N/A;  CIRC ARREST  . CARDIAC CATHETERIZATION N/A 11/15/2015   Procedure: Right/Left Heart Cath and Coronary Angiography;  Surgeon: Kathleene Hazel, MD;  Location: [facility] INVASIVE CV LAB;  Service: Cardiovascular;  Laterality: N/A;  . DILATION AND CURETTAGE OF UTERUS    . EXPLORATORY LAPAROTOMY     for tubal pregnancy  . EYE SURGERY Bilateral    lasik  . REPLACEMENT ASCENDING AORTA N/A 12/03/2015   Procedure: REPLACEMENT ASCENDING AORTA USING HEMASHIELD GRAFT;  Surgeon: Alleen Borne, MD;  Location: [facility] OR;  Service: Open Heart  Surgery;  Laterality: N/A;  . TEE WITHOUT CARDIOVERSION N/A 11/07/2015   Procedure: TRANSESOPHAGEAL ECHOCARDIOGRAM (TEE);  Surgeon: Chrystie Nose, MD;  Location: [facility] ENDOSCOPY;  Service: Cardiovascular;  Laterality: N/A;  . TEE WITHOUT CARDIOVERSION N/A 12/03/2015   Procedure: TRANSESOPHAGEAL ECHOCARDIOGRAM (TEE);  Surgeon: Alleen Borne, MD;  Location: [facility] OR;  Service: Open Heart Surgery;  Laterality: N/A;  . vein  surgery lower extremity       Current Meds  Medication Sig  . acetaminophen (TYLENOL) 325 MG tablet Take 2 tablets (650 mg total) by mouth every 6 (six) hours as needed for mild pain.  Marland Kitchen aspirin 81 MG tablet Take 81 mg by mouth at bedtime.   . Cholecalciferol (VITAMIN D) 1000 UNITS capsule Take 2,000 Units by mouth daily.   Marland Kitchen ECHINACEA PO Take 1 capsule by mouth at bedtime.  Marland Kitchen ezetimibe (ZETIA) 10 MG tablet Take 1 tablet (10 mg total) by mouth daily.  . fish oil-omega-3 fatty acids 1000 MG capsule Take 2 g by mouth daily.    . flecainide (TAMBOCOR) 50 MG tablet Take 1 tablet (50 mg total) by mouth every 12 (twelve) hours.  Marland Kitchen lisinopril (ZESTRIL) 5 MG tablet Take 1 tablet (5 mg total) by mouth daily.  Marland Kitchen MAGNESIUM CARBONATE PO Take 1 tablet by mouth at bedtime.   . metoprolol succinate (TOPROL XL) 25 MG 24 hr tablet Take 2 tablets (50 mg total) by mouth daily.  . Multiple Vitamin (MULTIVITAMIN WITH MINERALS) TABS Take 1 tablet by mouth at bedtime.   . NON FORMULARY Apply 0.5 mLs topically daily. 70/30 mix of estriol and estradiol in a cream base. Compounded by custom Pharmacy  . PRESCRIPTION MEDICATION Apply 0.1 mLs topically daily. Testosterone 2% cream compounded custom pharmacy  . progesterone (PROMETRIUM) 100 MG capsule Take 100 mg by mouth at bedtime.  . rosuvastatin (CRESTOR) 10 MG tablet Take 1 tablet (10 mg total) by mouth daily.  . vitamin B-12 (CYANOCOBALAMIN) 1000 MCG tablet Take 1,000 mcg by mouth at bedtime.   . vitamin C (ASCORBIC ACID) 500 MG tablet Take 1,000 mg by mouth at bedtime.   Marland Kitchen warfarin (COUMADIN) 5 MG tablet TAKE AS DIRECTED BY COUMADIN CLINIC (Patient taking differently: Take 5 mg by mouth daily at 6 PM. )  . [DISCONTINUED] ezetimibe (ZETIA) 10 MG tablet Take 1 tablet (10 mg total) by mouth daily.  . [DISCONTINUED] flecainide (TAMBOCOR) 50 MG tablet Take 1 tablet (50 mg total) by mouth every 12 (twelve) hours.  . [DISCONTINUED] lisinopril (PRINIVIL,ZESTRIL) 5 MG  tablet Take 1 tablet (5 mg total) by mouth daily.  . [DISCONTINUED] metoprolol succinate (TOPROL XL) 25 MG 24 hr tablet Take 2 tablets (50 mg total) by mouth daily.  . [DISCONTINUED] rosuvastatin (CRESTOR) 10 MG tablet Take 1 tablet (10 mg total) by mouth daily.     Allergies:   Patient has no known allergies.   Social History   Tobacco Use  . Smoking status: Never Smoker  . Smokeless tobacco: Never Used  Substance Use Topics  . Alcohol use: Yes    Comment: socially  . Drug use: No     Family Hx: The patient's family history includes Cancer in her paternal grandmother; Diabetes in her mother; Heart attack (age of onset: 70) in her mother; Heart disease in her sister; Heart failure in her brother; Hyperlipidemia in her mother; Stroke in her paternal grandmother.  ROS:   Please see the history of present illness.      All other  systems reviewed and are negative.   Prior CV studies:   The following studies were reviewed today:  TTE: 04/27/2018   IMPRESSIONS    1. There is a mechanical aortic valve. Aortic valve regurgitation is trivial by color flow Doppler. Mean gradient 19 mmHg across the mechanical aortic valve, mildly elevated gradient.  2. The mitral valve is normal in structure. There is mild mitral annular calcification present. No evidence of mitral valve stenosis. No significant regurgitation.  3. The left ventricle has normal systolic function with an ejection fraction of 60-65%. The cavity size was normal. There is mildly increased left ventricular wall thickness. Left ventricular diastolic Doppler parameters are consistent with  pseudonormalization No evidence of left ventricular regional wall motion abnormalities.  4. The aortic root and ascending aorta are normal in size and structure.  5. The tricuspid valve is normal in structure.  6. Left atrial size was mildly dilated.  7. The right ventricle has mildly reduced systolic function. The cavity was mildly enlarged.  There is no increase in right ventricular wall thickness.  8. The IVC was normal in size. PA systolic pressure 31 mmHg. _____________   CTA 9/2019IMPRESSION: Status post surgical repair of ascending thoracic aortic and aortic valve. There is no evidence of abdominal aortic aneurysm or dissection currently.     Electronically Signed   By: Lupita RaiderJames  Green Jr, M.D.   On: 11/23/2017 09:50   Labs/Other Tests and Data Reviewed:    EKG:  An ECG dated 05/07/18 was personally reviewed today and demonstrated:  NSR with poor R wave progression anteriorly  Recent Labs: 03/08/2018: ALT 34 04/27/2018: Hemoglobin 12.9; Platelets 224; TSH 1.303 04/28/2018: BUN 19; Creatinine, Ser 0.99; Potassium 4.7; Sodium 141   Recent Lipid Panel Lab Results  Component Value Date/Time   CHOL 220 (H) 03/08/2018 08:32 AM   TRIG 174 (H) 03/08/2018 08:32 AM   HDL 86 03/08/2018 08:32 AM   CHOLHDL 2.6 03/08/2018 08:32 AM   CHOLHDL 2 09/18/2009 04:21 PM   LDLCALC 99 03/08/2018 08:32 AM   LDLDIRECT 180 (H) 12/16/2016 03:10 PM    Wt Readings from Last 3 Encounters:  04/28/18 158 lb 12.8 oz (72 kg)  10/26/17 156 lb (70.8 kg)  07/21/16 156 lb 12.8 oz (71.1 kg)     Objective:    Vital Signs:  BP 130/63   Pulse (!) 54   Ht 5\' 4"  (1.626 m)   BMI 27.26 kg/m    VITAL SIGNS:  reviewed GEN:  no acute distress RESPIRATORY:  normal respiratory effort, symmetric expansion CARDIOVASCULAR:  no peripheral edema  ASSESSMENT & PLAN:    1. Status post mechanical AVR 12/2015 on Coumadin.  Echo 04/2018 feel aortic regurgitation mean gradient 19 mmHg mildly elevated.  EF 60 to 65%. Doing well  2. History of SVT started on flecainide by Dr. Johney FrameAllred and ultimately should have ablation-[place] and her insurance runs out at the end of June. Will schedule visit with Dr. Johney FrameAllred to discuss options. 3. Essential hypertension well controlled 4. Hyperlipidemia on Zetia and rosuvastatin LDL 99 trig 174 03/08/18 5.  Mildly dilated thoracic aortic aneurysm on CT 11/2017  COVID-19 Education: The signs and symptoms of COVID-19 were discussed with the patient and how to seek care for testing (follow up with PCP or arrange E-visit).   The importance of social distancing was discussed today.  Time:   Today, I have spent 22 minutes with the patient with telehealth technology discussing the above problems.  Medication Adjustments/Labs and Tests Ordered: Current medicines are reviewed at length with the patient today.  Concerns regarding medicines are outlined above.   Tests Ordered: No orders of the defined types were placed in this encounter.   Medication Changes: Meds ordered this encounter  Medications  . ezetimibe (ZETIA) 10 MG tablet    Sig: Take 1 tablet (10 mg total) by mouth daily.    Dispense:  90 tablet    Refill:  3  . flecainide (TAMBOCOR) 50 MG tablet    Sig: Take 1 tablet (50 mg total) by mouth every 12 (twelve) hours.    Dispense:  180 tablet    Refill:  3  . lisinopril (ZESTRIL) 5 MG tablet    Sig: Take 1 tablet (5 mg total) by mouth daily.    Dispense:  90 tablet    Refill:  3  . metoprolol succinate (TOPROL XL) 25 MG 24 hr tablet    Sig: Take 2 tablets (50 mg total) by mouth daily.    Dispense:  180 tablet    Refill:  3  . rosuvastatin (CRESTOR) 10 MG tablet    Sig: Take 1 tablet (10 mg total) by mouth daily.    Dispense:  90 tablet    Refill:  3    Dose decrease    Disposition:  Follow up in 1 week(s) Dr. Johney Frame to discuss ablation, 1 yr with Dr. Clifton James [place]  Signed, Jacolyn Reedy, PA-C  07/20/2018 9:04 AM    [facility]

## 2018-07-20 ENCOUNTER — Encounter: Payer: Self-pay | Admitting: Physician Assistant

## 2018-07-20 ENCOUNTER — Other Ambulatory Visit: Payer: Self-pay

## 2018-07-20 ENCOUNTER — Telehealth: Payer: Self-pay

## 2018-07-20 ENCOUNTER — Telehealth (INDEPENDENT_AMBULATORY_CARE_PROVIDER_SITE_OTHER): Payer: BLUE CROSS/BLUE SHIELD | Admitting: Physician Assistant

## 2018-07-20 VITALS — BP 130/63 | HR 54 | Ht 64.0 in

## 2018-07-20 DIAGNOSIS — I712 Thoracic aortic aneurysm, without rupture, unspecified: Secondary | ICD-10-CM

## 2018-07-20 DIAGNOSIS — Z952 Presence of prosthetic heart valve: Secondary | ICD-10-CM

## 2018-07-20 DIAGNOSIS — I10 Essential (primary) hypertension: Secondary | ICD-10-CM

## 2018-07-20 DIAGNOSIS — I471 Supraventricular tachycardia: Secondary | ICD-10-CM

## 2018-07-20 DIAGNOSIS — E782 Mixed hyperlipidemia: Secondary | ICD-10-CM

## 2018-07-20 MED ORDER — METOPROLOL SUCCINATE ER 25 MG PO TB24: 50.0000 mg | ORAL_TABLET | Freq: Every day | ORAL | 3 refills | Status: DC

## 2018-07-20 MED ORDER — ROSUVASTATIN CALCIUM 10 MG PO TABS: 10.0000 mg | ORAL_TABLET | Freq: Every day | ORAL | 3 refills | Status: DC

## 2018-07-20 MED ORDER — FLECAINIDE ACETATE 50 MG PO TABS: 50.0000 mg | ORAL_TABLET | Freq: Two times a day (BID) | ORAL | 3 refills | Status: DC

## 2018-07-20 MED ORDER — LISINOPRIL 5 MG PO TABS: 5.0000 mg | ORAL_TABLET | Freq: Every day | ORAL | 3 refills | Status: DC

## 2018-07-20 MED ORDER — EZETIMIBE 10 MG PO TABS: 10.0000 mg | ORAL_TABLET | Freq: Every day | ORAL | 3 refills | Status: DC

## 2018-07-20 NOTE — Patient Instructions (Addendum)
Medication Instructions:  Your physician recommends that you continue on your current medications as directed. Please refer to the Current Medication list given to you today.  If you need a refill on your cardiac medications before your next appointment, please call your pharmacy.   Lab work: None Ordered  If you have labs (blood work) drawn today and your tests are completely normal, you will receive your results only by: Marland Kitchen MyChart Message (if you have MyChart) OR . A paper copy in the mail If you have any lab test that is abnormal or we need to change your treatment, we will call you to review the results.  Testing/Procedures: None ordered  Follow-Up: Follow up with Dr. Johney Frame via VIDEO Visit on 07/23/18 at 11:15 AM  At [facility], you and your health needs are our priority.  As part of our continuing mission to provide you with exceptional heart care, we have created designated Provider Care Teams.  These Care Teams include your primary Cardiologist (physician) and Advanced Practice Providers (APPs -  Physician Assistants and Nurse Practitioners) who all work together to provide you with the care you need, when you need it. . You will need a follow up appointment in 1 year.  Please call our office 2 months in advance to schedule this appointment.  You may see Earney Hamburg, MD or one of the following Advanced Practice Providers on your designated Care Team:   . Robbie Lis, PA-C . Dayna Dunn, PA-C . Jacolyn Reedy, PA-C  Any Other Special Instructions Will Be Listed Below (If Applicable).

## 2018-07-21 NOTE — Telephone Encounter (Signed)
Spoke with pt regarding appt on 07/23/18. Pt was advise to check vitals prior to appt. Pt questions and concerns were address. 

## 2018-07-22 DIAGNOSIS — L570 Actinic keratosis: Secondary | ICD-10-CM | POA: Diagnosis not present

## 2018-07-22 DIAGNOSIS — I788 Other diseases of capillaries: Secondary | ICD-10-CM | POA: Diagnosis not present

## 2018-07-22 DIAGNOSIS — B070 Plantar wart: Secondary | ICD-10-CM | POA: Diagnosis not present

## 2018-07-22 DIAGNOSIS — Z85828 Personal history of other malignant neoplasm of skin: Secondary | ICD-10-CM | POA: Diagnosis not present

## 2018-07-22 DIAGNOSIS — C44519 Basal cell carcinoma of skin of other part of trunk: Secondary | ICD-10-CM | POA: Diagnosis not present

## 2018-07-22 DIAGNOSIS — D2262 Melanocytic nevi of left upper limb, including shoulder: Secondary | ICD-10-CM | POA: Diagnosis not present

## 2018-07-22 DIAGNOSIS — C44612 Basal cell carcinoma of skin of right upper limb, including shoulder: Secondary | ICD-10-CM | POA: Diagnosis not present

## 2018-07-23 ENCOUNTER — Encounter: Payer: Self-pay | Admitting: Internal Medicine

## 2018-07-23 ENCOUNTER — Telehealth (INDEPENDENT_AMBULATORY_CARE_PROVIDER_SITE_OTHER): Payer: BLUE CROSS/BLUE SHIELD | Admitting: Internal Medicine

## 2018-07-23 VITALS — BP 135/54 | HR 63 | Ht 64.0 in | Wt 158.8 lb

## 2018-07-23 DIAGNOSIS — I471 Supraventricular tachycardia: Secondary | ICD-10-CM | POA: Diagnosis not present

## 2018-07-23 NOTE — Progress Notes (Signed)
Electrophysiology TeleHealth Note   Due to national recommendations of social distancing due to COVID 19, an audio telehealth visit is felt to be most appropriate for this patient at this time.  See MyChart message from today for the patient's consent to telehealth for [facility].  She is unable to get Mychart virtual video to work today.  Date:  07/23/2018   ID:  Marissa Bailey, DOB 08/13/1960, MRN [id number]  Location: patient's home  Provider location: [address], [place]  Evaluation Performed: Follow-up visit  PCP:  Madelin HeadingsPanosh, Wanda K, MD  Cardiologist:  Verne Carrowhristopher McAlhany, MD  Electrophysiologist:  Dr Johney FrameAllred  Chief Complaint: SVT  History of Present Illness:    Marissa SpiceDonna Bailey is a 58 y.o. female who presents via audio conferencing for a telehealth visit today.  Since last being seen in our clinic, the patient reports doing very well.  Her SVT is well controlled.  She is quitting her job 08/02/2018 and then is moving 08/10/2018. She is worried about losing her insurance and wishes to reconsider ablation. Today, she denies symptoms of palpitations, chest pain, shortness of breath,  lower extremity edema, dizziness, presyncope, or syncope.  The patient is otherwise without complaint today.  The patient denies symptoms of fevers, chills, cough, or new SOB worrisome for COVID 19.  Past Medical History:  Diagnosis Date  . Abnormal TSH   . Anemia   . Anxiety   . Aortic valve insufficiency    a. s/p Bentall with mechanical aortic valve replacement, with replacement of the ascending aorta and proximal aortic arch 12/2015.  Marland Kitchen. Hyperlipidemia, mixed   . Migraines    menstrual  . Patent foramen ovale   . Premature atrial contractions   . PSVT (paroxysmal supraventricular tachycardia) (HCC)    a. 48 hour monitor in 2013 with NSR, PACs and several short runs of SVT (15 beats).  . Seasonal allergies   . Thoracic aortic aneurysm (HCC)    a. s/p Bentall 2017.    Past  Surgical History:  Procedure Laterality Date  . BENTALL PROCEDURE N/A 12/03/2015   Procedure: BENTALL PROCEDURE USING 21MM ST. JUDE AORTIC VALVE CONDUIT;  Surgeon: Alleen BorneBryan K Bartle, MD;  Location: [facility] OR;  Service: Open Heart Surgery;  Laterality: N/A;  CIRC ARREST  . CARDIAC CATHETERIZATION N/A 11/15/2015   Procedure: Right/Left Heart Cath and Coronary Angiography;  Surgeon: Kathleene Hazelhristopher D McAlhany, MD;  [facility] CV LAB;  Service: Cardiovascular;  Laterality: N/A;  . DILATION AND CURETTAGE OF UTERUS    . EXPLORATORY LAPAROTOMY     for tubal pregnancy  . EYE SURGERY Bilateral    lasik  . REPLACEMENT ASCENDING AORTA N/A 12/03/2015   Procedure: REPLACEMENT ASCENDING AORTA USING 28MM HEMASHIELD GRAFT;  Surgeon: Alleen BorneBryan K Bartle, MD;  Location: [facility] OR;  Service: Open Heart Surgery;  Laterality: N/A;  . TEE WITHOUT CARDIOVERSION N/A 11/07/2015   Procedure: TRANSESOPHAGEAL ECHOCARDIOGRAM (TEE);  Surgeon: Chrystie NoseKenneth C Hilty, MD;  [facility];  Service: Cardiovascular;  Laterality: N/A;  . TEE WITHOUT CARDIOVERSION N/A 12/03/2015   Procedure: TRANSESOPHAGEAL ECHOCARDIOGRAM (TEE);  Surgeon: Alleen BorneBryan K Bartle, MD;  [facility];  Service: Open Heart Surgery;  Laterality: N/A;  . vein surgery lower extremity      Current Outpatient Medications  Medication Sig Dispense Refill  . acetaminophen (TYLENOL) 325 MG tablet Take 2 tablets (650 mg total) by mouth every 6 (six) hours as needed for mild pain.    Marland Kitchen. aspirin 81  MG tablet Take 81 mg by mouth at bedtime.     . Cholecalciferol (VITAMIN D) 1000 UNITS capsule Take 2,000 Units by mouth daily.     Marland Kitchen ECHINACEA PO Take 1 capsule by mouth at bedtime.    Marland Kitchen ezetimibe (ZETIA) 10 MG tablet Take 1 tablet (10 mg total) by mouth daily. 90 tablet 3  . fish oil-omega-3 fatty acids 1000 MG capsule Take 2 g by mouth daily.      . flecainide (TAMBOCOR) 50 MG tablet Take 1 tablet (50 mg total) by mouth every 12 (twelve) hours. 180 tablet 3  . lisinopril  (ZESTRIL) 5 MG tablet Take 1 tablet (5 mg total) by mouth daily. 90 tablet 3  . MAGNESIUM CARBONATE PO Take 1 tablet by mouth at bedtime.     . metoprolol succinate (TOPROL XL) 25 MG 24 hr tablet Take 2 tablets (50 mg total) by mouth daily. 180 tablet 3  . Multiple Vitamin (MULTIVITAMIN WITH MINERALS) TABS Take 1 tablet by mouth at bedtime.     . NON FORMULARY Apply 0.5 mLs topically daily. 70/30 mix of estriol and estradiol in a cream base. Compounded by custom Pharmacy    . PRESCRIPTION MEDICATION Apply 0.1 mLs topically daily. Testosterone 2% cream compounded custom pharmacy    . progesterone (PROMETRIUM) 100 MG capsule Take 100 mg by mouth at bedtime.    . rosuvastatin (CRESTOR) 10 MG tablet Take 1 tablet (10 mg total) by mouth daily. 90 tablet 3  . vitamin B-12 (CYANOCOBALAMIN) 1000 MCG tablet Take 1,000 mcg by mouth at bedtime.     . vitamin C (ASCORBIC ACID) 500 MG tablet Take 1,000 mg by mouth at bedtime.     Marland Kitchen warfarin (COUMADIN) 5 MG tablet TAKE AS DIRECTED BY COUMADIN CLINIC (Patient taking differently: Take 5 mg by mouth daily at 6 PM. ) 105 tablet 1   No current facility-administered medications for this visit.     Allergies:   Patient has no known allergies.   Social History:  The patient  reports that she has never smoked. She has never used smokeless tobacco. She reports current alcohol use. She reports that she does not use drugs.   Family History:  The patient's family history includes Cancer in her paternal grandmother; Diabetes in her mother; Heart attack (age of onset: 58) in her mother; Heart disease in her sister; Heart failure in her brother; Hyperlipidemia in her mother; Stroke in her paternal grandmother.   ROS:  Please see the history of present illness.   All other systems are personally reviewed and negative.    Exam:    Vital Signs:  BP (!) 135/54   Pulse 63   Ht 5\' 4"  (1.626 m)   Wt 158 lb 12.8 oz (72 kg)   BMI 27.26 kg/m   Well sounding   Labs/Other  Tests and Data Reviewed:    Recent Labs: 03/08/2018: ALT 34 04/27/2018: Hemoglobin 12.9; Platelets 224; TSH 1.303 04/28/2018: BUN 19; Creatinine, Ser 0.99; Potassium 4.7; Sodium 141   Wt Readings from Last 3 Encounters:  07/23/18 158 lb 12.8 oz (72 kg)  04/28/18 158 lb 12.8 oz (72 kg)  10/26/17 156 lb (70.8 kg)     Other studies personally reviewed: Additional studies/ records that were reviewed today include: my prior notes  Review of the above records today demonstrates: as above  ASSESSMENT & PLAN:    1.  SVT Well controlled with flecainide I would not advise urgent ablation in the setting  of COVID 19.  If she has return of symptomatic SVT, I would advise that she follow-up with EP at [facility] [place].   No urgent indication for EP procedures.  2. HTN Stable No change required today  3. COVID 19 screen The patient denies symptoms of COVID 19 at this time.  The importance of social distancing was discussed today.  Follow-up:  As above  Current medicines are reviewed at length with the patient today.   The patient does not have concerns regarding her medicines.  The following changes were made today:  none  Labs/ tests ordered today include:  No orders of the defined types were placed in this encounter.  Patient Risk:  after full review of this patients clinical status, I feel that they are at moderate risk at this time.  Today, I have spent 15 minutes with the patient with telehealth technology discussing SVT .    Randolm Idol, MD  07/23/2018 11:39 AM     [facility] [address] [place] [address] [phone] (office) [phone] (fax)

## 2018-07-27 ENCOUNTER — Other Ambulatory Visit: Payer: Self-pay

## 2018-07-30 ENCOUNTER — Telehealth: Payer: Self-pay

## 2018-07-30 NOTE — Telephone Encounter (Signed)
lmom for prescreen  

## 2018-08-02 ENCOUNTER — Ambulatory Visit (INDEPENDENT_AMBULATORY_CARE_PROVIDER_SITE_OTHER): Payer: BLUE CROSS/BLUE SHIELD | Admitting: *Deleted

## 2018-08-02 ENCOUNTER — Other Ambulatory Visit: Payer: Self-pay

## 2018-08-02 DIAGNOSIS — Z79899 Other long term (current) drug therapy: Secondary | ICD-10-CM

## 2018-08-02 DIAGNOSIS — Z5181 Encounter for therapeutic drug level monitoring: Secondary | ICD-10-CM

## 2018-08-02 DIAGNOSIS — Z952 Presence of prosthetic heart valve: Secondary | ICD-10-CM

## 2018-08-02 LAB — POCT INR: INR: 3.3 — AB (ref 2.0–3.0)

## 2018-08-02 NOTE — Patient Instructions (Signed)
Description   Skip today's dose, then change dose to 5mg  daily and then half a tab on Wednesdays. Recheck INR in 3 weeks.  Coumadin Clinic [phone]. Pt is planning on moving on 08/03/18 to [facility].

## 2018-08-19 ENCOUNTER — Other Ambulatory Visit: Payer: Self-pay | Admitting: Cardiovascular Disease

## 2018-08-24 DIAGNOSIS — Z952 Presence of prosthetic heart valve: Secondary | ICD-10-CM | POA: Diagnosis not present

## 2018-08-24 DIAGNOSIS — Z5181 Encounter for therapeutic drug level monitoring: Secondary | ICD-10-CM | POA: Diagnosis not present

## 2018-08-24 LAB — PROTIME-INR: INR: 2.2 — AB (ref 0.9–1.1)

## 2018-08-25 ENCOUNTER — Ambulatory Visit (INDEPENDENT_AMBULATORY_CARE_PROVIDER_SITE_OTHER): Payer: BLUE CROSS/BLUE SHIELD | Admitting: Cardiovascular Disease

## 2018-08-25 ENCOUNTER — Ambulatory Visit: Payer: Self-pay | Admitting: Cardiovascular Disease

## 2018-08-25 DIAGNOSIS — Z952 Presence of prosthetic heart valve: Secondary | ICD-10-CM

## 2018-08-25 DIAGNOSIS — Z5181 Encounter for therapeutic drug level monitoring: Secondary | ICD-10-CM | POA: Diagnosis not present

## 2018-08-25 DIAGNOSIS — Z79899 Other long term (current) drug therapy: Secondary | ICD-10-CM

## 2018-08-25 NOTE — Progress Notes (Signed)
This encounter was created in error - please disregard.

## 2018-08-25 NOTE — Patient Instructions (Addendum)
Description   Spoke with pt and instructed pt to continue taking 5mg  daily and then 2.5mg  on Wednesdays. Recheck INR in 4 weeks.  Coumadin Clinic [phone]. Pt moved to [facility] (08/2018-make sure she sees Cardiology annually).

## 2018-08-25 NOTE — Addendum Note (Signed)
Addended by: Derrel Nip B on: 08/25/2018 04:41 PM   Modules accepted: Level of Service, SmartSet

## 2018-09-20 ENCOUNTER — Telehealth: Payer: Self-pay

## 2018-09-20 NOTE — Telephone Encounter (Signed)
Left message regarding appt on 09/27/18. 

## 2018-09-27 ENCOUNTER — Telehealth (INDEPENDENT_AMBULATORY_CARE_PROVIDER_SITE_OTHER): Payer: BC Managed Care – PPO | Admitting: Internal Medicine

## 2018-09-27 DIAGNOSIS — Z952 Presence of prosthetic heart valve: Secondary | ICD-10-CM | POA: Diagnosis not present

## 2018-09-27 DIAGNOSIS — I10 Essential (primary) hypertension: Secondary | ICD-10-CM | POA: Diagnosis not present

## 2018-09-27 DIAGNOSIS — I471 Supraventricular tachycardia: Secondary | ICD-10-CM

## 2018-09-27 DIAGNOSIS — Z79899 Other long term (current) drug therapy: Secondary | ICD-10-CM | POA: Diagnosis not present

## 2018-09-27 DIAGNOSIS — Z5181 Encounter for therapeutic drug level monitoring: Secondary | ICD-10-CM | POA: Diagnosis not present

## 2018-09-27 LAB — PROTIME-INR: INR: 1.9 — AB (ref 0.9–1.1)

## 2018-09-27 NOTE — Progress Notes (Signed)
Electrophysiology TeleHealth Note  Due to national recommendations of social distancing due to COVID 19, an audio telehealth visit is felt to be most appropriate for this patient at this time.  Verbal consent was obtained by me for the telehealth visit today.  The patient does not have capability for a virtual visit.  A phone visit is therefore required today.   Date:  09/27/2018   ID:  Pura Spiceonna Rockett, DOB February 02, 1961, MRN [id number]  [place]  [facility]  Evaluation Performed: Follow-up visit  PCP:  Madelin HeadingsPanosh, Wanda K, MD   Electrophysiologist:  Dr Johney FrameAllred  Chief Complaint:  palpitations  History of Present Illness:    Pura SpiceDonna Hladik is a 58 y.o. female who presents via telehealth conferencing today.  Since last being seen in our clinic, the patient reports doing very well.  [place]  She has started a new job and is happy with her relocation there.  She denies arrhythmias and is pleased with her current state.  She has had had no further SVT.  Today, she denies symptoms of palpitations, chest pain, shortness of breath,  lower extremity edema, dizziness, presyncope, or syncope.  The patient is otherwise without complaint today.  The patient denies symptoms of fevers, chills, cough, or new SOB worrisome for COVID 19.  Past Medical History:  Diagnosis Date  . Abnormal TSH   . Anemia   . Anxiety   . Aortic valve insufficiency    a. s/p Bentall with mechanical aortic valve replacement, with replacement of the ascending aorta and proximal aortic arch 12/2015.  Marland Kitchen. Hyperlipidemia, mixed   . Migraines    menstrual  . Patent foramen ovale   . Premature atrial contractions   . PSVT (paroxysmal supraventricular tachycardia) (HCC)    a. 48 hour monitor in 2013 with NSR, PACs and several short runs of SVT (15 beats).  . Seasonal allergies   . Thoracic aortic aneurysm (HCC)    a. s/p Bentall 2017.    Past Surgical  History:  Procedure Laterality Date  . BENTALL PROCEDURE N/A 12/03/2015   Procedure: BENTALL PROCEDURE USING 21MM ST. JUDE AORTIC VALVE CONDUIT;  Surgeon: Alleen BorneBryan K Bartle, MD;  Location: [facility] OR;  Service: Open Heart Surgery;  Laterality: N/A;  CIRC ARREST  . CARDIAC CATHETERIZATION N/A 11/15/2015   Procedure: Right/Left Heart Cath and Coronary Angiography;  Surgeon: Kathleene Hazelhristopher D McAlhany, MD;  [facility];  Service: Cardiovascular;  Laterality: N/A;  . DILATION AND CURETTAGE OF UTERUS    . EXPLORATORY LAPAROTOMY     for tubal pregnancy  . EYE SURGERY Bilateral    lasik  . REPLACEMENT ASCENDING AORTA N/A 12/03/2015   Procedure: REPLACEMENT ASCENDING AORTA USING 28MM HEMASHIELD GRAFT;  Surgeon: Alleen BorneBryan K Bartle, MD;  Location: [facility] OR;  Service: Open Heart Surgery;  Laterality: N/A;  . TEE WITHOUT CARDIOVERSION N/A 11/07/2015   Procedure: TRANSESOPHAGEAL ECHOCARDIOGRAM (TEE);  Surgeon: Chrystie NoseKenneth C Hilty, MD;  [facility];  Service: Cardiovascular;  Laterality: N/A;  . TEE WITHOUT CARDIOVERSION N/A 12/03/2015   Procedure: TRANSESOPHAGEAL ECHOCARDIOGRAM (TEE);  Surgeon: Alleen BorneBryan K Bartle, MD;  [facility];  Service: Open Heart Surgery;  Laterality: N/A;  . vein surgery lower extremity      Current Outpatient Medications  Medication Sig Dispense Refill  . acetaminophen (TYLENOL) 325 MG tablet Take 2 tablets (650 mg total) by mouth every 6 (six) hours as needed for  mild pain.    Marland Kitchen. aspirin 81 MG tablet Take 81 mg by mouth at bedtime.     . Cholecalciferol (VITAMIN D) 1000 UNITS capsule Take 2,000 Units by mouth daily.     Marland Kitchen. ECHINACEA PO Take 1 capsule by mouth at bedtime.    Marland Kitchen. ezetimibe (ZETIA) 10 MG tablet Take 1 tablet (10 mg total) by mouth daily. 90 tablet 3  . fish oil-omega-3 fatty acids 1000 MG capsule Take 2 g by mouth daily.      . flecainide (TAMBOCOR) 50 MG tablet Take 1 tablet (50 mg total) by mouth every 12 (twelve) hours. 180 tablet 3  . lisinopril (ZESTRIL) 5 MG  tablet Take 1 tablet (5 mg total) by mouth daily. 90 tablet 3  . MAGNESIUM CARBONATE PO Take 1 tablet by mouth at bedtime.     . metoprolol succinate (TOPROL-XL) 25 MG 24 hr tablet TAKE 1 TABLET BY MOUTH EVERY DAY 90 tablet 3  . Multiple Vitamin (MULTIVITAMIN WITH MINERALS) TABS Take 1 tablet by mouth at bedtime.     . NON FORMULARY Apply 0.5 mLs topically daily. 70/30 mix of estriol and estradiol in a cream base. Compounded by custom Pharmacy    . PRESCRIPTION MEDICATION Apply 0.1 mLs topically daily. Testosterone 2% cream compounded custom pharmacy    . progesterone (PROMETRIUM) 100 MG capsule Take 100 mg by mouth at bedtime.    . rosuvastatin (CRESTOR) 10 MG tablet Take 1 tablet (10 mg total) by mouth daily. 90 tablet 3  . vitamin B-12 (CYANOCOBALAMIN) 1000 MCG tablet Take 1,000 mcg by mouth at bedtime.     . vitamin C (ASCORBIC ACID) 500 MG tablet Take 1,000 mg by mouth at bedtime.     Marland Kitchen. warfarin (COUMADIN) 5 MG tablet TAKE AS DIRECTED BY COUMADIN CLINIC (Patient taking differently: Take 5 mg by mouth daily at 6 PM. ) 105 tablet 1   No current facility-administered medications for this visit.     Allergies:   Patient has no known allergies.   Social History:  The patient  reports that she has never smoked. She has never used smokeless tobacco. She reports current alcohol use. She reports that she does not use drugs.   Family History:  The patient's  family history includes Cancer in her paternal grandmother; Diabetes in her mother; Heart attack (age of onset: 5846) in her mother; Heart disease in her sister; Heart failure in her brother; Hyperlipidemia in her mother; Stroke in her paternal grandmother.   ROS:  Please see the history of present illness.   All other systems are personally reviewed and negative.    Exam:    Vital Signs:  There were no vitals taken for this visit.  Well sounding today   Labs/Other Tests and Data Reviewed:    Recent Labs: 03/08/2018: ALT 34 04/27/2018:  Hemoglobin 12.9; Platelets 224; TSH 1.303 04/28/2018: BUN 19; Creatinine, Ser 0.99; Potassium 4.7; Sodium 141   Wt Readings from Last 3 Encounters:  07/23/18 158 lb 12.8 oz (72 kg)  04/28/18 158 lb 12.8 oz (72 kg)  10/26/17 156 lb (70.8 kg)     ASSESSMENT & PLAN:    1.  SVT Well controlled with flecainide I have advised again today that she follow-up with EP in her new area.  She will make arrangements to do so.  2. HTN Stable No change required today  3. S/p AVR On coumadin She [place]  The importance of follow-up was advised at length today.  Follow-up:  Return to see me as needed   Patient Risk:  after full review of this patients clinical status, I feel that they are at moderate risk at this time.  Today, I have spent 15 minutes with the patient with telehealth technology discussing arrhythmia management .    Army Fossa, MD  09/27/2018 11:25 AM     [address] [place] [address] [phone] (office) [phone] (fax)

## 2018-09-28 ENCOUNTER — Ambulatory Visit (INDEPENDENT_AMBULATORY_CARE_PROVIDER_SITE_OTHER): Payer: BC Managed Care – PPO | Admitting: Cardiology

## 2018-09-28 DIAGNOSIS — Z952 Presence of prosthetic heart valve: Secondary | ICD-10-CM

## 2018-09-28 DIAGNOSIS — Z5181 Encounter for therapeutic drug level monitoring: Secondary | ICD-10-CM | POA: Diagnosis not present

## 2018-09-29 NOTE — Patient Instructions (Signed)
Description   Spoke with pt and pt did take 7.5mg  yesterday advised to continue taking 1 tablet daily except 2.5mg  on Wednesdays. Recheck INR in 4 weeks in the Clinic. Coumadin Clinic [phone] (08/2018-Moving to [facility] [place] must see MD Annually).

## 2018-10-06 DIAGNOSIS — Z952 Presence of prosthetic heart valve: Secondary | ICD-10-CM | POA: Diagnosis not present

## 2018-10-06 DIAGNOSIS — H539 Unspecified visual disturbance: Secondary | ICD-10-CM | POA: Diagnosis not present

## 2018-10-06 DIAGNOSIS — E782 Mixed hyperlipidemia: Secondary | ICD-10-CM | POA: Diagnosis not present

## 2018-10-06 DIAGNOSIS — I10 Essential (primary) hypertension: Secondary | ICD-10-CM | POA: Diagnosis not present

## 2018-11-01 DIAGNOSIS — E782 Mixed hyperlipidemia: Secondary | ICD-10-CM | POA: Diagnosis not present

## 2018-11-01 DIAGNOSIS — Z952 Presence of prosthetic heart valve: Secondary | ICD-10-CM | POA: Diagnosis not present

## 2018-11-23 DIAGNOSIS — I471 Supraventricular tachycardia: Secondary | ICD-10-CM | POA: Diagnosis not present

## 2018-11-23 DIAGNOSIS — I351 Nonrheumatic aortic (valve) insufficiency: Secondary | ICD-10-CM | POA: Diagnosis not present

## 2018-11-23 DIAGNOSIS — Z7901 Long term (current) use of anticoagulants: Secondary | ICD-10-CM | POA: Diagnosis not present

## 2018-11-23 DIAGNOSIS — Z952 Presence of prosthetic heart valve: Secondary | ICD-10-CM | POA: Diagnosis not present

## 2018-11-25 DIAGNOSIS — N951 Menopausal and female climacteric states: Secondary | ICD-10-CM | POA: Diagnosis not present

## 2018-11-25 DIAGNOSIS — S60459A Superficial foreign body of unspecified finger, initial encounter: Secondary | ICD-10-CM | POA: Diagnosis not present

## 2018-12-30 ENCOUNTER — Telehealth: Payer: Self-pay | Admitting: *Deleted

## 2018-12-30 NOTE — Telephone Encounter (Signed)
Pt overdue for INR recheck. She was last seen 09/28/2018. Called pt and had to leave a message for the pt to callback regarding scheduling an appt or call us back with the name of the Provider that is managing her INR. Provided direct call back number.

## 2018-12-31 DIAGNOSIS — Z952 Presence of prosthetic heart valve: Secondary | ICD-10-CM | POA: Diagnosis not present

## 2019-01-17 DIAGNOSIS — Z952 Presence of prosthetic heart valve: Secondary | ICD-10-CM | POA: Diagnosis not present

## 2019-07-25 ENCOUNTER — Other Ambulatory Visit: Payer: Self-pay | Admitting: Physician Assistant

## 2019-08-09 ENCOUNTER — Other Ambulatory Visit: Payer: Self-pay | Admitting: Physician Assistant

## 2019-09-09 ENCOUNTER — Other Ambulatory Visit: Payer: Self-pay | Admitting: Physician Assistant

## 2019-09-09 ENCOUNTER — Other Ambulatory Visit: Payer: Self-pay | Admitting: Cardiovascular Disease

## 2019-09-19 ENCOUNTER — Other Ambulatory Visit: Payer: Self-pay | Admitting: Physician Assistant

## 2019-09-23 ENCOUNTER — Other Ambulatory Visit: Payer: Self-pay | Admitting: Cardiovascular Disease

## 2019-12-06 ENCOUNTER — Other Ambulatory Visit: Payer: Self-pay | Admitting: Physician Assistant

## 2020-10-01 ENCOUNTER — Telehealth: Payer: Self-pay

## 2020-10-01 NOTE — Telephone Encounter (Signed)
Wrong pt please document in correct chart
